# Patient Record
Sex: Female | Born: 1945 | Race: Black or African American | Hispanic: No | Smoking: Never smoker
Health system: Southern US, Community
[De-identification: ages and names within clinical notes are randomized; demographics above are authoritative.]

## PROBLEM LIST (undated history)

## (undated) DIAGNOSIS — E119 Type 2 diabetes mellitus without complications: Secondary | ICD-10-CM

## (undated) DIAGNOSIS — J45909 Unspecified asthma, uncomplicated: Secondary | ICD-10-CM

## (undated) DIAGNOSIS — R011 Cardiac murmur, unspecified: Secondary | ICD-10-CM

## (undated) DIAGNOSIS — K589 Irritable bowel syndrome without diarrhea: Secondary | ICD-10-CM

## (undated) DIAGNOSIS — T783XXA Angioneurotic edema, initial encounter: Secondary | ICD-10-CM

## (undated) DIAGNOSIS — F4389 Other reactions to severe stress: Secondary | ICD-10-CM

## (undated) DIAGNOSIS — I10 Essential (primary) hypertension: Secondary | ICD-10-CM

## (undated) DIAGNOSIS — L309 Dermatitis, unspecified: Secondary | ICD-10-CM

## (undated) DIAGNOSIS — M545 Low back pain, unspecified: Secondary | ICD-10-CM

## (undated) DIAGNOSIS — J309 Allergic rhinitis, unspecified: Secondary | ICD-10-CM

## (undated) DIAGNOSIS — E785 Hyperlipidemia, unspecified: Secondary | ICD-10-CM

## (undated) DIAGNOSIS — I499 Cardiac arrhythmia, unspecified: Secondary | ICD-10-CM

## (undated) DIAGNOSIS — F438 Other reactions to severe stress: Secondary | ICD-10-CM

## (undated) DIAGNOSIS — F0390 Unspecified dementia without behavioral disturbance: Secondary | ICD-10-CM

## (undated) DIAGNOSIS — K449 Diaphragmatic hernia without obstruction or gangrene: Secondary | ICD-10-CM

## (undated) HISTORY — DX: Angioneurotic edema, initial encounter: T78.3XXA

## (undated) HISTORY — DX: Irritable bowel syndrome, unspecified: K58.9

## (undated) HISTORY — DX: Hyperlipidemia, unspecified: E78.5

## (undated) HISTORY — DX: Unspecified asthma, uncomplicated: J45.909

## (undated) HISTORY — DX: Low back pain: M54.5

## (undated) HISTORY — DX: Low back pain, unspecified: M54.50

## (undated) HISTORY — DX: Dermatitis, unspecified: L30.9

## (undated) HISTORY — DX: Cardiac murmur, unspecified: R01.1

## (undated) HISTORY — DX: Diaphragmatic hernia without obstruction or gangrene: K44.9

## (undated) HISTORY — DX: Other reactions to severe stress: F43.8

## (undated) HISTORY — DX: Other reactions to severe stress: F43.89

## (undated) HISTORY — DX: Type 2 diabetes mellitus without complications: E11.9

## (undated) HISTORY — PX: TUBAL LIGATION: SHX77

## (undated) HISTORY — DX: Allergic rhinitis, unspecified: J30.9

## (undated) HISTORY — DX: Essential (primary) hypertension: I10

---

## 1985-10-20 HISTORY — PX: OOPHORECTOMY: SHX86

## 1985-10-20 HISTORY — PX: ABDOMINAL HYSTERECTOMY: SHX81

## 1999-01-08 ENCOUNTER — Emergency Department (HOSPITAL_COMMUNITY): Admission: EM | Admit: 1999-01-08 | Discharge: 1999-01-09 | Payer: Self-pay | Admitting: Emergency Medicine

## 1999-12-24 ENCOUNTER — Encounter: Payer: Self-pay | Admitting: Obstetrics and Gynecology

## 1999-12-24 ENCOUNTER — Encounter: Admission: RE | Admit: 1999-12-24 | Discharge: 1999-12-24 | Payer: Self-pay | Admitting: Obstetrics and Gynecology

## 2002-02-11 ENCOUNTER — Emergency Department (HOSPITAL_COMMUNITY): Admission: EM | Admit: 2002-02-11 | Discharge: 2002-02-11 | Payer: Self-pay | Admitting: Emergency Medicine

## 2002-04-14 ENCOUNTER — Ambulatory Visit (HOSPITAL_COMMUNITY): Admission: RE | Admit: 2002-04-14 | Discharge: 2002-04-14 | Payer: Self-pay | Admitting: Family Medicine

## 2002-06-06 ENCOUNTER — Emergency Department (HOSPITAL_COMMUNITY): Admission: EM | Admit: 2002-06-06 | Discharge: 2002-06-06 | Payer: Self-pay | Admitting: Emergency Medicine

## 2002-06-08 ENCOUNTER — Ambulatory Visit (HOSPITAL_COMMUNITY): Admission: RE | Admit: 2002-06-08 | Discharge: 2002-06-08 | Payer: Self-pay | Admitting: Emergency Medicine

## 2002-06-08 ENCOUNTER — Encounter: Payer: Self-pay | Admitting: Emergency Medicine

## 2002-08-30 ENCOUNTER — Ambulatory Visit (HOSPITAL_COMMUNITY): Admission: RE | Admit: 2002-08-30 | Discharge: 2002-08-30 | Payer: Self-pay | Admitting: Gastroenterology

## 2002-08-30 ENCOUNTER — Encounter: Payer: Self-pay | Admitting: Gastroenterology

## 2002-09-01 ENCOUNTER — Encounter (INDEPENDENT_AMBULATORY_CARE_PROVIDER_SITE_OTHER): Payer: Self-pay | Admitting: Gastroenterology

## 2003-03-20 ENCOUNTER — Encounter: Payer: Self-pay | Admitting: Emergency Medicine

## 2003-03-20 ENCOUNTER — Inpatient Hospital Stay (HOSPITAL_COMMUNITY): Admission: EM | Admit: 2003-03-20 | Discharge: 2003-03-21 | Payer: Self-pay | Admitting: Emergency Medicine

## 2004-01-02 ENCOUNTER — Inpatient Hospital Stay (HOSPITAL_COMMUNITY): Admission: EM | Admit: 2004-01-02 | Discharge: 2004-01-03 | Payer: Self-pay | Admitting: Emergency Medicine

## 2004-08-09 ENCOUNTER — Ambulatory Visit: Payer: Self-pay | Admitting: Internal Medicine

## 2004-09-19 ENCOUNTER — Ambulatory Visit: Payer: Self-pay | Admitting: Internal Medicine

## 2004-12-19 ENCOUNTER — Ambulatory Visit: Payer: Self-pay | Admitting: Internal Medicine

## 2005-03-28 ENCOUNTER — Ambulatory Visit: Payer: Self-pay | Admitting: Internal Medicine

## 2005-06-24 ENCOUNTER — Ambulatory Visit: Payer: Self-pay | Admitting: Internal Medicine

## 2005-06-27 ENCOUNTER — Ambulatory Visit: Payer: Self-pay | Admitting: Internal Medicine

## 2005-09-26 ENCOUNTER — Ambulatory Visit: Payer: Self-pay | Admitting: Internal Medicine

## 2005-10-20 HISTORY — PX: HEMORRHOID SURGERY: SHX153

## 2005-12-25 ENCOUNTER — Ambulatory Visit: Payer: Self-pay | Admitting: Internal Medicine

## 2006-04-07 ENCOUNTER — Ambulatory Visit: Payer: Self-pay | Admitting: Internal Medicine

## 2006-04-08 ENCOUNTER — Ambulatory Visit: Payer: Self-pay | Admitting: Internal Medicine

## 2006-05-14 ENCOUNTER — Ambulatory Visit: Payer: Self-pay | Admitting: Internal Medicine

## 2006-05-22 ENCOUNTER — Ambulatory Visit: Payer: Self-pay | Admitting: Internal Medicine

## 2006-06-09 ENCOUNTER — Other Ambulatory Visit: Admission: RE | Admit: 2006-06-09 | Discharge: 2006-06-09 | Payer: Self-pay | Admitting: Obstetrics and Gynecology

## 2006-07-13 ENCOUNTER — Ambulatory Visit: Payer: Self-pay | Admitting: Internal Medicine

## 2006-08-21 ENCOUNTER — Ambulatory Visit: Payer: Self-pay | Admitting: Internal Medicine

## 2006-08-25 ENCOUNTER — Ambulatory Visit: Payer: Self-pay | Admitting: Internal Medicine

## 2006-09-28 ENCOUNTER — Ambulatory Visit: Payer: Self-pay | Admitting: Internal Medicine

## 2006-12-29 ENCOUNTER — Ambulatory Visit: Payer: Self-pay | Admitting: Internal Medicine

## 2006-12-29 LAB — CONVERTED CEMR LAB
ALT: 22 units/L (ref 0–40)
AST: 21 units/L (ref 0–37)
Albumin: 3.7 g/dL (ref 3.5–5.2)
Alkaline Phosphatase: 74 units/L (ref 39–117)
Calcium: 9 mg/dL (ref 8.4–10.5)
Chloride: 102 meq/L (ref 96–112)
Cholesterol: 202 mg/dL (ref 0–200)
Creatinine, Ser: 0.9 mg/dL (ref 0.4–1.2)
GFR calc non Af Amer: 68 mL/min
Hgb A1c MFr Bld: 7.6 % — ABNORMAL HIGH (ref 4.6–6.0)
Sodium: 143 meq/L (ref 135–145)
Total Bilirubin: 0.8 mg/dL (ref 0.3–1.2)
VLDL: 15 mg/dL (ref 0–40)

## 2007-03-03 ENCOUNTER — Ambulatory Visit: Payer: Self-pay | Admitting: Internal Medicine

## 2007-04-27 ENCOUNTER — Ambulatory Visit: Payer: Self-pay | Admitting: Internal Medicine

## 2007-04-30 ENCOUNTER — Ambulatory Visit (HOSPITAL_COMMUNITY): Admission: RE | Admit: 2007-04-30 | Discharge: 2007-04-30 | Payer: Self-pay | Admitting: Obstetrics and Gynecology

## 2007-06-17 ENCOUNTER — Ambulatory Visit: Payer: Self-pay | Admitting: Internal Medicine

## 2007-07-02 ENCOUNTER — Encounter: Payer: Self-pay | Admitting: Internal Medicine

## 2007-07-02 ENCOUNTER — Ambulatory Visit (HOSPITAL_COMMUNITY): Admission: RE | Admit: 2007-07-02 | Discharge: 2007-07-02 | Payer: Self-pay | Admitting: Internal Medicine

## 2007-07-02 HISTORY — PX: COLONOSCOPY: SHX174

## 2007-07-02 LAB — HM COLONOSCOPY

## 2007-07-07 ENCOUNTER — Ambulatory Visit: Payer: Self-pay | Admitting: Internal Medicine

## 2007-07-22 ENCOUNTER — Encounter: Payer: Self-pay | Admitting: Internal Medicine

## 2007-07-22 DIAGNOSIS — E118 Type 2 diabetes mellitus with unspecified complications: Secondary | ICD-10-CM | POA: Insufficient documentation

## 2007-07-22 DIAGNOSIS — I10 Essential (primary) hypertension: Secondary | ICD-10-CM | POA: Insufficient documentation

## 2007-07-22 DIAGNOSIS — E1165 Type 2 diabetes mellitus with hyperglycemia: Secondary | ICD-10-CM

## 2007-07-22 DIAGNOSIS — E11319 Type 2 diabetes mellitus with unspecified diabetic retinopathy without macular edema: Secondary | ICD-10-CM

## 2007-08-13 ENCOUNTER — Encounter: Admission: RE | Admit: 2007-08-13 | Discharge: 2007-08-13 | Payer: Self-pay | Admitting: Surgery

## 2007-08-16 ENCOUNTER — Ambulatory Visit (HOSPITAL_BASED_OUTPATIENT_CLINIC_OR_DEPARTMENT_OTHER): Admission: RE | Admit: 2007-08-16 | Discharge: 2007-08-16 | Payer: Self-pay | Admitting: Surgery

## 2007-08-16 ENCOUNTER — Encounter (INDEPENDENT_AMBULATORY_CARE_PROVIDER_SITE_OTHER): Payer: Self-pay | Admitting: Surgery

## 2007-11-04 ENCOUNTER — Ambulatory Visit: Payer: Self-pay | Admitting: Internal Medicine

## 2007-11-04 DIAGNOSIS — L272 Dermatitis due to ingested food: Secondary | ICD-10-CM

## 2007-11-04 DIAGNOSIS — L259 Unspecified contact dermatitis, unspecified cause: Secondary | ICD-10-CM

## 2007-11-06 DIAGNOSIS — E785 Hyperlipidemia, unspecified: Secondary | ICD-10-CM | POA: Insufficient documentation

## 2007-11-06 DIAGNOSIS — E1169 Type 2 diabetes mellitus with other specified complication: Secondary | ICD-10-CM | POA: Insufficient documentation

## 2008-01-17 ENCOUNTER — Telehealth: Payer: Self-pay | Admitting: Internal Medicine

## 2008-01-17 DIAGNOSIS — Z872 Personal history of diseases of the skin and subcutaneous tissue: Secondary | ICD-10-CM | POA: Insufficient documentation

## 2008-01-18 ENCOUNTER — Encounter: Payer: Self-pay | Admitting: Internal Medicine

## 2008-01-18 ENCOUNTER — Ambulatory Visit: Payer: Self-pay | Admitting: Internal Medicine

## 2008-01-18 DIAGNOSIS — J4531 Mild persistent asthma with (acute) exacerbation: Secondary | ICD-10-CM

## 2008-01-18 DIAGNOSIS — J209 Acute bronchitis, unspecified: Secondary | ICD-10-CM | POA: Insufficient documentation

## 2008-01-18 DIAGNOSIS — J4530 Mild persistent asthma, uncomplicated: Secondary | ICD-10-CM | POA: Insufficient documentation

## 2008-01-25 ENCOUNTER — Telehealth: Payer: Self-pay | Admitting: Internal Medicine

## 2008-01-25 ENCOUNTER — Encounter: Payer: Self-pay | Admitting: Internal Medicine

## 2008-01-27 ENCOUNTER — Encounter (INDEPENDENT_AMBULATORY_CARE_PROVIDER_SITE_OTHER): Payer: Self-pay | Admitting: *Deleted

## 2008-02-02 ENCOUNTER — Ambulatory Visit: Payer: Self-pay | Admitting: Internal Medicine

## 2008-02-02 DIAGNOSIS — J309 Allergic rhinitis, unspecified: Secondary | ICD-10-CM

## 2008-02-16 ENCOUNTER — Ambulatory Visit: Payer: Self-pay | Admitting: Pulmonary Disease

## 2008-03-09 ENCOUNTER — Telehealth: Payer: Self-pay | Admitting: Internal Medicine

## 2008-03-14 ENCOUNTER — Ambulatory Visit: Payer: Self-pay | Admitting: Internal Medicine

## 2008-03-22 ENCOUNTER — Ambulatory Visit: Payer: Self-pay | Admitting: Internal Medicine

## 2008-03-22 LAB — CONVERTED CEMR LAB
BUN: 5 mg/dL — ABNORMAL LOW (ref 6–23)
CO2: 32 meq/L (ref 19–32)
Chloride: 109 meq/L (ref 96–112)
Glucose, Bld: 150 mg/dL — ABNORMAL HIGH (ref 70–99)
Potassium: 3.6 meq/L (ref 3.5–5.1)
Sodium: 135 meq/L (ref 135–145)

## 2008-04-10 ENCOUNTER — Ambulatory Visit: Payer: Self-pay | Admitting: Internal Medicine

## 2008-06-01 ENCOUNTER — Ambulatory Visit: Payer: Self-pay | Admitting: Internal Medicine

## 2008-06-02 ENCOUNTER — Ambulatory Visit (HOSPITAL_COMMUNITY): Admission: RE | Admit: 2008-06-02 | Discharge: 2008-06-02 | Payer: Self-pay | Admitting: Obstetrics and Gynecology

## 2008-07-17 ENCOUNTER — Telehealth (INDEPENDENT_AMBULATORY_CARE_PROVIDER_SITE_OTHER): Payer: Self-pay | Admitting: *Deleted

## 2008-07-17 ENCOUNTER — Telehealth: Payer: Self-pay | Admitting: Internal Medicine

## 2008-08-03 ENCOUNTER — Ambulatory Visit: Payer: Self-pay | Admitting: Internal Medicine

## 2008-08-10 ENCOUNTER — Encounter: Payer: Self-pay | Admitting: Internal Medicine

## 2008-08-10 ENCOUNTER — Ambulatory Visit: Payer: Self-pay

## 2008-08-17 ENCOUNTER — Ambulatory Visit: Payer: Self-pay | Admitting: Internal Medicine

## 2008-08-17 DIAGNOSIS — F438 Other reactions to severe stress: Secondary | ICD-10-CM

## 2008-08-17 DIAGNOSIS — F4389 Other reactions to severe stress: Secondary | ICD-10-CM | POA: Insufficient documentation

## 2008-09-03 ENCOUNTER — Emergency Department (HOSPITAL_COMMUNITY): Admission: EM | Admit: 2008-09-03 | Discharge: 2008-09-03 | Payer: Self-pay | Admitting: Emergency Medicine

## 2008-09-04 ENCOUNTER — Telehealth: Payer: Self-pay | Admitting: Internal Medicine

## 2008-09-04 ENCOUNTER — Ambulatory Visit: Payer: Self-pay | Admitting: Internal Medicine

## 2008-09-04 DIAGNOSIS — R55 Syncope and collapse: Secondary | ICD-10-CM | POA: Insufficient documentation

## 2008-09-27 ENCOUNTER — Ambulatory Visit: Payer: Self-pay | Admitting: Internal Medicine

## 2009-01-09 ENCOUNTER — Ambulatory Visit: Payer: Self-pay | Admitting: Internal Medicine

## 2009-01-09 LAB — CONVERTED CEMR LAB
AST: 28 units/L (ref 0–37)
Alkaline Phosphatase: 95 units/L (ref 39–117)
BUN: 11 mg/dL (ref 6–23)
CO2: 32 meq/L (ref 19–32)
Chloride: 102 meq/L (ref 96–112)
Cholesterol: 151 mg/dL (ref 0–200)
GFR calc non Af Amer: 72.22 mL/min (ref >60)
Glucose, Bld: 109 mg/dL — ABNORMAL HIGH (ref 70–99)
LDL Cholesterol: 89 mg/dL (ref 0–99)
Potassium: 3.5 meq/L (ref 3.5–5.1)
Sodium: 144 meq/L (ref 135–145)
Total Protein: 7.2 g/dL (ref 6.0–8.3)
Triglycerides: 59 mg/dL (ref 0.0–149.0)

## 2009-01-12 ENCOUNTER — Ambulatory Visit: Payer: Self-pay | Admitting: Internal Medicine

## 2009-02-13 ENCOUNTER — Encounter: Payer: Self-pay | Admitting: Internal Medicine

## 2009-02-14 ENCOUNTER — Ambulatory Visit: Payer: Self-pay | Admitting: Internal Medicine

## 2009-02-14 ENCOUNTER — Encounter: Payer: Self-pay | Admitting: Internal Medicine

## 2009-03-06 ENCOUNTER — Ambulatory Visit: Payer: Self-pay | Admitting: Internal Medicine

## 2009-06-04 ENCOUNTER — Ambulatory Visit (HOSPITAL_COMMUNITY): Admission: RE | Admit: 2009-06-04 | Discharge: 2009-06-04 | Payer: Self-pay | Admitting: Obstetrics and Gynecology

## 2009-06-06 ENCOUNTER — Encounter: Admission: RE | Admit: 2009-06-06 | Discharge: 2009-06-06 | Payer: Self-pay | Admitting: Obstetrics and Gynecology

## 2009-07-03 ENCOUNTER — Ambulatory Visit: Payer: Self-pay | Admitting: Internal Medicine

## 2009-07-09 LAB — CONVERTED CEMR LAB
AST: 30 units/L (ref 0–37)
Albumin: 4.3 g/dL (ref 3.5–5.2)
Alkaline Phosphatase: 89 units/L (ref 39–117)
BUN: 8 mg/dL (ref 6–23)
Basophils Relative: 0.3 % (ref 0.0–3.0)
Chloride: 105 meq/L (ref 96–112)
GFR calc non Af Amer: 72.1 mL/min (ref >60)
Glucose, Bld: 107 mg/dL — ABNORMAL HIGH (ref 70–99)
HCT: 40.1 % (ref 36.0–46.0)
Hemoglobin: 13 g/dL (ref 12.0–15.0)
Hgb A1c MFr Bld: 6.6 % — ABNORMAL HIGH (ref 4.6–6.5)
Ketones, ur: NEGATIVE mg/dL
Lymphocytes Relative: 30.6 % (ref 12.0–46.0)
Lymphs Abs: 2.6 10*3/uL (ref 0.7–4.0)
MCHC: 32.5 g/dL (ref 30.0–36.0)
Monocytes Relative: 4.4 % (ref 3.0–12.0)
Neutro Abs: 4.9 10*3/uL (ref 1.4–7.7)
Potassium: 3.3 meq/L — ABNORMAL LOW (ref 3.5–5.1)
RBC: 4.56 M/uL (ref 3.87–5.11)
Sodium: 148 meq/L — ABNORMAL HIGH (ref 135–145)
TSH: 1.31 microintl units/mL (ref 0.35–5.50)
Total Protein: 7.7 g/dL (ref 6.0–8.3)
Urine Glucose: NEGATIVE mg/dL
Urobilinogen, UA: 0.2 (ref 0.0–1.0)

## 2009-10-16 ENCOUNTER — Telehealth: Payer: Self-pay | Admitting: Internal Medicine

## 2009-10-31 ENCOUNTER — Ambulatory Visit: Payer: Self-pay | Admitting: Internal Medicine

## 2009-10-31 LAB — CONVERTED CEMR LAB
CO2: 30 meq/L (ref 19–32)
Chloride: 106 meq/L (ref 96–112)
GFR calc non Af Amer: 72.03 mL/min (ref >60)
Glucose, Bld: 95 mg/dL (ref 70–99)
Potassium: 4 meq/L (ref 3.5–5.1)
Sodium: 143 meq/L (ref 135–145)

## 2009-11-12 ENCOUNTER — Ambulatory Visit: Payer: Self-pay | Admitting: Internal Medicine

## 2009-11-12 DIAGNOSIS — J069 Acute upper respiratory infection, unspecified: Secondary | ICD-10-CM | POA: Insufficient documentation

## 2009-12-04 ENCOUNTER — Encounter: Admission: RE | Admit: 2009-12-04 | Discharge: 2009-12-04 | Payer: Self-pay | Admitting: Obstetrics and Gynecology

## 2010-01-21 ENCOUNTER — Encounter (INDEPENDENT_AMBULATORY_CARE_PROVIDER_SITE_OTHER): Payer: Self-pay | Admitting: *Deleted

## 2010-01-21 ENCOUNTER — Ambulatory Visit: Payer: Self-pay | Admitting: Internal Medicine

## 2010-01-21 DIAGNOSIS — T783XXA Angioneurotic edema, initial encounter: Secondary | ICD-10-CM

## 2010-01-21 HISTORY — DX: Angioneurotic edema, initial encounter: T78.3XXA

## 2010-03-12 ENCOUNTER — Ambulatory Visit: Payer: Self-pay | Admitting: Internal Medicine

## 2010-03-12 DIAGNOSIS — M545 Low back pain: Secondary | ICD-10-CM

## 2010-03-13 ENCOUNTER — Telehealth: Payer: Self-pay | Admitting: Internal Medicine

## 2010-03-14 LAB — CONVERTED CEMR LAB
Alkaline Phosphatase: 87 units/L (ref 39–117)
Basophils Absolute: 0 10*3/uL (ref 0.0–0.1)
Bilirubin, Direct: 0.1 mg/dL (ref 0.0–0.3)
CO2: 30 meq/L (ref 19–32)
Calcium: 9.3 mg/dL (ref 8.4–10.5)
Eosinophils Absolute: 0.5 10*3/uL (ref 0.0–0.7)
HCT: 37.8 % (ref 36.0–46.0)
Hemoglobin: 12.5 g/dL (ref 12.0–15.0)
Ketones, ur: NEGATIVE mg/dL
Lymphocytes Relative: 23.5 % (ref 12.0–46.0)
Lymphs Abs: 2.1 10*3/uL (ref 0.7–4.0)
MCHC: 33.1 g/dL (ref 30.0–36.0)
Neutro Abs: 5.9 10*3/uL (ref 1.4–7.7)
Potassium: 3.6 meq/L (ref 3.5–5.1)
RDW: 14.3 % (ref 11.5–14.6)
Sodium: 144 meq/L (ref 135–145)
Total Protein: 6.9 g/dL (ref 6.0–8.3)
Urine Glucose: NEGATIVE mg/dL
Urobilinogen, UA: 0.2 (ref 0.0–1.0)
Vitamin B-12: 485 pg/mL (ref 211–911)

## 2010-04-16 ENCOUNTER — Ambulatory Visit: Payer: Self-pay | Admitting: Internal Medicine

## 2010-04-16 DIAGNOSIS — R05 Cough: Secondary | ICD-10-CM | POA: Insufficient documentation

## 2010-05-08 ENCOUNTER — Ambulatory Visit: Payer: Self-pay | Admitting: Internal Medicine

## 2010-05-08 DIAGNOSIS — R21 Rash and other nonspecific skin eruption: Secondary | ICD-10-CM | POA: Insufficient documentation

## 2010-05-20 ENCOUNTER — Ambulatory Visit: Payer: Self-pay | Admitting: Obstetrics and Gynecology

## 2010-05-20 ENCOUNTER — Other Ambulatory Visit: Admission: RE | Admit: 2010-05-20 | Discharge: 2010-05-20 | Payer: Self-pay | Admitting: Obstetrics and Gynecology

## 2010-07-09 ENCOUNTER — Ambulatory Visit: Payer: Self-pay | Admitting: Internal Medicine

## 2010-07-11 ENCOUNTER — Encounter: Payer: Self-pay | Admitting: Internal Medicine

## 2010-08-02 ENCOUNTER — Ambulatory Visit: Payer: Self-pay | Admitting: Internal Medicine

## 2010-08-12 ENCOUNTER — Ambulatory Visit: Payer: Self-pay | Admitting: Internal Medicine

## 2010-08-14 LAB — CONVERTED CEMR LAB
ALT: 22 units/L (ref 0–35)
BUN: 6 mg/dL (ref 6–23)
Bilirubin Urine: NEGATIVE
Bilirubin, Direct: 0.1 mg/dL (ref 0.0–0.3)
Calcium: 9.1 mg/dL (ref 8.4–10.5)
Creatinine, Ser: 0.8 mg/dL (ref 0.4–1.2)
GFR calc non Af Amer: 91.62 mL/min (ref >60)
Hgb A1c MFr Bld: 7.3 % — ABNORMAL HIGH (ref 4.6–6.5)
Nitrite: NEGATIVE
Specific Gravity, Urine: 1.01 (ref 1.000–1.030)
Total Bilirubin: 0.4 mg/dL (ref 0.3–1.2)
Total Protein, Urine: NEGATIVE mg/dL
pH: 6 (ref 5.0–8.0)

## 2010-08-20 ENCOUNTER — Ambulatory Visit: Payer: Self-pay | Admitting: Obstetrics and Gynecology

## 2010-11-06 ENCOUNTER — Encounter: Payer: Self-pay | Admitting: Internal Medicine

## 2010-11-06 ENCOUNTER — Encounter
Admission: RE | Admit: 2010-11-06 | Discharge: 2010-11-06 | Payer: Self-pay | Source: Home / Self Care | Attending: Emergency Medicine | Admitting: Emergency Medicine

## 2010-11-10 ENCOUNTER — Encounter: Payer: Self-pay | Admitting: Obstetrics and Gynecology

## 2010-11-12 ENCOUNTER — Ambulatory Visit
Admission: RE | Admit: 2010-11-12 | Discharge: 2010-11-12 | Payer: Self-pay | Source: Home / Self Care | Attending: Internal Medicine | Admitting: Internal Medicine

## 2010-11-12 ENCOUNTER — Other Ambulatory Visit: Payer: Self-pay | Admitting: Internal Medicine

## 2010-11-12 LAB — BASIC METABOLIC PANEL
BUN: 8 mg/dL (ref 6–23)
Calcium: 8.9 mg/dL (ref 8.4–10.5)
GFR: 95.63 mL/min (ref >60.00)
Glucose, Bld: 129 mg/dL — ABNORMAL HIGH (ref 70–99)
Potassium: 3.5 mEq/L (ref 3.5–5.1)
Sodium: 143 mEq/L (ref 135–145)

## 2010-11-12 LAB — URINALYSIS, ROUTINE W REFLEX MICROSCOPIC
Bilirubin Urine: NEGATIVE
Ketones, ur: NEGATIVE
Nitrite: NEGATIVE
Specific Gravity, Urine: >=1.03 (ref 1.000–1.030)
Total Protein, Urine: NEGATIVE
pH: 5.5 (ref 5.0–8.0)

## 2010-11-12 LAB — HEPATIC FUNCTION PANEL
ALT: 20 U/L (ref 0–35)
AST: 19 U/L (ref 0–37)
Alkaline Phosphatase: 77 U/L (ref 39–117)
Bilirubin, Direct: 0.1 mg/dL (ref 0.0–0.3)
Total Bilirubin: 0.5 mg/dL (ref 0.3–1.2)

## 2010-11-12 LAB — CBC WITH DIFFERENTIAL/PLATELET
Basophils Absolute: 0 10*3/uL (ref 0.0–0.1)
Eosinophils Relative: 7.6 % — ABNORMAL HIGH (ref 0.0–5.0)
HCT: 37.3 % (ref 36.0–46.0)
Lymphocytes Relative: 21.4 % (ref 12.0–46.0)
Lymphs Abs: 1.7 10*3/uL (ref 0.7–4.0)
Monocytes Relative: 4.3 % (ref 3.0–12.0)
Platelets: 235 10*3/uL (ref 150.0–400.0)
RDW: 14.6 % (ref 11.5–14.6)
WBC: 7.9 10*3/uL (ref 4.5–10.5)

## 2010-11-12 LAB — LIPID PANEL
HDL: 45.8 mg/dL (ref >39.00)
Triglycerides: 102 mg/dL (ref 0.0–149.0)

## 2010-11-12 LAB — LDL CHOLESTEROL, DIRECT: Direct LDL: 140.2 mg/dL

## 2010-11-15 ENCOUNTER — Ambulatory Visit
Admission: RE | Admit: 2010-11-15 | Discharge: 2010-11-15 | Payer: Self-pay | Source: Home / Self Care | Attending: Internal Medicine | Admitting: Internal Medicine

## 2010-11-15 DIAGNOSIS — S060X9A Concussion with loss of consciousness of unspecified duration, initial encounter: Secondary | ICD-10-CM | POA: Insufficient documentation

## 2010-11-15 DIAGNOSIS — S060XAA Concussion with loss of consciousness status unknown, initial encounter: Secondary | ICD-10-CM | POA: Insufficient documentation

## 2010-11-15 DIAGNOSIS — M542 Cervicalgia: Secondary | ICD-10-CM | POA: Insufficient documentation

## 2010-11-19 NOTE — Assessment & Plan Note (Signed)
Summary: 4 MO ROV /NWS  #   Vital Signs:  Patient profile:   65 year old female Weight:      192 pounds Temp:     98.3 degrees F oral Pulse rate:   54 / minute BP sitting:   138 / 76  (left arm)  Vitals Entered By: Tora Perches (November 12, 2009 11:21 AM) CC: f/u Is Patient Diabetic? Yes   Primary Care Provider:  Dr. Posey Rea  CC:  f/u.  History of Present Illness: The patient presents for a follow up of hypertension, diabetes, hyperlipidemia. Ran out of Norvasc... The patient presents with complaints of sore throat, fever, cough, sinus congestion and drainge of 10-14 days duration. Not better with OTC meds. Chest hurts with coughing. Can't sleep due to cough.   The mucus is colored.    Preventive Screening-Counseling & Management  Alcohol-Tobacco     Smoking Status: never  Current Medications (verified): 1)  Klor-Con M20 20 Meq  Tbcr (Potassium Chloride Crys Cr) .... Two Times A Day 2)  Fexofenadine Hcl 180 Mg  Tabs (Fexofenadine Hcl) .... Once Daily 3)  Nasonex 50 Mcg/act  Susp (Mometasone Furoate) .Marland Kitchen.. 1 Spray Each Nostril Once Daily 4)  Advair Diskus 100-50 Mcg/dose  Misc (Fluticasone-Salmeterol) .... One Puff Two Times A Day For Asthma 5)  Epipen 0.3 Mg/0.54ml (1:1000)  Devi (Epinephrine Hcl (Anaphylaxis)) .... As Needed 6)  Anusol-Hc 25 Mg Supp (Hydrocortisone Acetate) .... Insert 1 Suppository Into Rectum Once A Day 7)  Glipizide-Metformin Hcl 2.5-500 Mg  Tabs (Glipizide-Metformin Hcl) .Marland Kitchen.. 1 By Mouth Two Times A Day 8)  Prevacid 30 Mg Cpdr (Lansoprazole) .... One By Mouth Daily 9)  Hyzaar 100-12.5 Mg  Tabs (Losartan Potassium-Hctz) .Marland Kitchen.. 1 By Mouth Daily 10)  Aspirin 81 Mg  Tbec (Aspirin) .... One By Mouth Every Day 11)  Fluoxetine Hcl 10 Mg Tabs (Fluoxetine Hcl) .Marland Kitchen.. 1 By Mouth Qd 12)  Amlodipine Besylate 5 Mg  Tabs (Amlodipine Besylate) .Marland Kitchen.. 1 By Mouth Every Day 13)  Vitamin D3 1000 Unit  Tabs (Cholecalciferol) .Marland Kitchen.. 1 By Mouth Daily  Allergies: 1)  ! Dynacirc  Cr (Isradipine) 2)  ! Maxzide 3)  ! Lovastatin (Lovastatin) 4)  ! Vytorin (Ezetimibe-Simvastatin) 5)  ! * Fish Oil 6)  ! Sulfadiazine (Sulfadiazine) 7)  Clonidine Hcl (Clonidine Hcl) 8)  * Dynacire 9)  * Maxide 10)  Sulfamethoxazole (Sulfamethoxazole) 11)  Metformin Hcl (Metformin Hcl)  Past History:  Past Medical History: Last updated: 02/02/2008 Diabetes mellitus, type II Hypertension IBS  Dr Juanda Chance Eczema Food allergies Hemorhoids Hyperlipidemia Asthma Allergic rhinitis  Social History: Last updated: 07/03/2009 Retired, workig part time [employer] Divorced Never Smoked  Review of Systems       The patient complains of prolonged cough.  The patient denies fever, syncope, and dyspnea on exertion.    Physical Exam  General:  obese.   Mouth:  Erythematous throat mucosa and intranasal erythema.  Lungs:  Normal respiratory effort, chest expands symmetrically. Lungs are clear to auscultation, no crackles or wheezes. Heart:  Normal rate and regular rhythm. S1 and S2 normal without gallop, murmur, click, rub or other extra sounds. Abdomen:  soft, non-tender, normal bowel sounds, no distention, no masses, no guarding, no rigidity, no rebound tenderness, no abdominal hernia, no inguinal hernia, no hepatomegaly, and no splenomegaly.   Msk:  No deformity or scoliosis noted of thoracic or lumbar spine.  R wrist is tender to palp and w/ROM Neurologic:  No cranial nerve deficits noted. Station  and gait are normal. Plantar reflexes are down-going bilaterally. DTRs are symmetrical throughout. Sensory, motor and coordinative functions appear intact. Skin:  Intact without suspicious lesions or rashes Psych:  Cognition and judgment appear intact. Alert and cooperative with normal attention span and concentration. No apparent delusions, illusions, hallucinations   Impression & Recommendations:  Problem # 1:  DIABETES MELLITUS, TYPE II (ICD-250.00) Assessment Unchanged  Her updated  medication list for this problem includes:    Glipizide-metformin Hcl 2.5-500 Mg Tabs (Glipizide-metformin hcl) .Marland Kitchen... 1 by mouth two times a day    Hyzaar 100-12.5 Mg Tabs (Losartan potassium-hctz) .Marland Kitchen... 1 by mouth daily    Aspirin 81 Mg Tbec (Aspirin) ..... One by mouth every day The labs were reviewed with the patient.   Problem # 2:  HYPERTENSION (ICD-401.9) Assessment: Unchanged  Her updated medication list for this problem includes:    Hyzaar 100-12.5 Mg Tabs (Losartan potassium-hctz) .Marland Kitchen... 1 by mouth daily    Amlodipine Besylate 5 Mg Tabs (Amlodipine besylate) .Marland Kitchen... 1 by mouth every day  BP today: 138/76 Prior BP: 142/72 (07/03/2009)  Labs Reviewed: K+: 4.0 (10/31/2009) Creat: : 1.0 (10/31/2009)   Chol: 151 (01/09/2009)   HDL: 49.90 (01/09/2009)   LDL: 89 (01/09/2009)   TG: 59.0 (01/09/2009)  Problem # 3:  ASTHMA (ICD-493.90) Assessment: Deteriorated  Her updated medication list for this problem includes:    Advair Diskus 100-50 Mcg/dose Misc (Fluticasone-salmeterol) ..... One puff two times a day for asthma  Problem # 4:  UPPER RESPIRATORY INFECTION, ACUTE (ICD-465.9) Assessment: New  The following medications were removed from the medication list:    Celebrex 200 Mg Caps (Celecoxib) ..... One by mouth once daily for pain in hip Her updated medication list for this problem includes:    Fexofenadine Hcl 180 Mg Tabs (Fexofenadine hcl) ..... Once daily    Aspirin 81 Mg Tbec (Aspirin) ..... One by mouth every day    Promethazine-codeine 6.25-10 Mg/67ml Syrp (Promethazine-codeine) .Marland Kitchen... 5-10 ml by mouth q id as needed cough  Problem # 5:  ANXIETY, SITUATIONAL (ICD-308.3)  Problem # 6:  HYPERLIPIDEMIA (ICD-272.4) Assessment: Comment Only  Complete Medication List: 1)  Klor-con M20 20 Meq Tbcr (Potassium chloride crys cr) .... Two times a day 2)  Fexofenadine Hcl 180 Mg Tabs (Fexofenadine hcl) .... Once daily 3)  Nasonex 50 Mcg/act Susp (Mometasone furoate) .Marland Kitchen.. 1 spray  each nostril once daily 4)  Advair Diskus 100-50 Mcg/dose Misc (Fluticasone-salmeterol) .... One puff two times a day for asthma 5)  Epipen 0.3 Mg/0.85ml (1:1000) Devi (Epinephrine hcl (anaphylaxis)) .... As needed 6)  Anusol-hc 25 Mg Supp (Hydrocortisone acetate) .... Insert 1 suppository into rectum once a day 7)  Glipizide-metformin Hcl 2.5-500 Mg Tabs (Glipizide-metformin hcl) .Marland Kitchen.. 1 by mouth two times a day 8)  Prevacid 30 Mg Cpdr (Lansoprazole) .... One by mouth daily 9)  Hyzaar 100-12.5 Mg Tabs (Losartan potassium-hctz) .Marland Kitchen.. 1 by mouth daily 10)  Aspirin 81 Mg Tbec (Aspirin) .... One by mouth every day 11)  Fluoxetine Hcl 10 Mg Tabs (Fluoxetine hcl) .Marland Kitchen.. 1 by mouth qd 12)  Amlodipine Besylate 5 Mg Tabs (Amlodipine besylate) .Marland Kitchen.. 1 by mouth every day 13)  Vitamin D3 1000 Unit Tabs (Cholecalciferol) .Marland Kitchen.. 1 by mouth daily 14)  Zithromax Z-pak 250 Mg Tabs (Azithromycin) .... As dirrected 15)  Promethazine-codeine 6.25-10 Mg/87ml Syrp (Promethazine-codeine) .... 5-10 ml by mouth q id as needed cough  Patient Instructions: 1)  Please schedule a follow-up appointment in 4 months. 2)  BMP prior  to visit, ICD-9: 3)  Lipid Panel prior to visit, ICD-9: 4)  HbgA1C prior to visit, ICD-9:250.00 Prescriptions: PROMETHAZINE-CODEINE 6.25-10 MG/5ML SYRP (PROMETHAZINE-CODEINE) 5-10 ml by mouth q id as needed cough  #300 ml x 0   Entered and Authorized by:   Tresa Garter MD   Signed by:   Tresa Garter MD on 11/12/2009   Method used:   Print then Give to Patient   RxID:   [id number] [id number] Z-PAK 250 MG TABS (AZITHROMYCIN) as dirrected  #1 x 0   Entered and Authorized by:   Tresa Garter MD   Signed by:   Tresa Garter MD on 11/12/2009   Method used:   Print then Give to Patient   RxID:   [id number] AMLODIPINE BESYLATE 5 MG  TABS (AMLODIPINE BESYLATE) 1 by mouth every day  #30 x 12   Entered and Authorized by:   Tresa Garter MD   Signed by:   Tresa Garter MD on 11/12/2009   Method used:   Print then Give to Patient   RxID:   [id number] AMLODIPINE BESYLATE 5 MG  TABS (AMLODIPINE BESYLATE) 1 by mouth every day  #90 x 3   Entered and Authorized by:   Tresa Garter MD   Signed by:   Tresa Garter MD on 11/12/2009   Method used:   Print then Give to Patient   RxID:   [id number]

## 2010-11-19 NOTE — Letter (Signed)
Summary: Out of Work  [facility]  [address] [place], [place] [address]   Phone: [phone]  Fax: [phone]    January 21, 2010   Employee:  Pamela Morgan    To Whom It May Concern:   For Medical reasons, please excuse the above named employee from work for the following dates:  Start: 01/21/10    End: 01/22/10, May return to work tomorrow     If you need additional information, please feel free to contact our office.         Sincerely,    Dr. Rene Paci

## 2010-11-19 NOTE — Assessment & Plan Note (Signed)
Summary: right knee pain,swollen/plot/cd   Vital Signs:  Patient profile:   65 year old female Height:      64 inches (162.56 cm) Weight:      190 pounds (86.36 kg) O2 Sat:      96 % on Room air Temp:     98.3 degrees F (36.83 degrees C) oral Pulse rate:   59 / minute BP sitting:   140 / 82  (left arm) Cuff size:   regular  Vitals Entered By: Orlan Leavens RMA (July 09, 2010 10:03 AM)  O2 Flow:  Room air CC: (R) knee pain & swollen Is Patient Diabetic? Yes Did you bring your meter with you today? No Pain Assessment Patient in pain? yes     Location: (R) knee Type: aching   Primary Care Provider:  Dr. Posey Rea  CC:  (R) knee pain & swollen.  History of Present Illness: c/o right knee pain onset 3 days ago precipitated by climbing stairs assoc with swelling behind the knee reports assoc difficulty walking due to pain, but min pain while sitting/at rest denies injury or twisting no hx same - no known OA or prior injury no other joints inflammed or affected in UE/hands, feet or left leg  Clinical Review Panels:  Lipid Management   Cholesterol:  151 (01/09/2009)   LDL (bad choesterol):  89 (01/09/2009)   HDL (good cholesterol):  49.90 (01/09/2009)  CBC   WBC:  9.0 (03/12/2010)   RBC:  4.35 (03/12/2010)   Hgb:  12.5 (03/12/2010)   Hct:  37.8 (03/12/2010)   Platelets:  265.0 (03/12/2010)   MCV  86.8 (03/12/2010)   MCHC  33.1 (03/12/2010)   RDW  14.3 (03/12/2010)   PMN:  65.3 (03/12/2010)   Lymphs:  23.5 (03/12/2010)   Monos:  5.5 (03/12/2010)   Eosinophils:  5.3 (03/12/2010)   Basophil:  0.4 (03/12/2010)  Complete Metabolic Panel   Glucose:  67 (03/12/2010)   Sodium:  144 (03/12/2010)   Potassium:  3.6 (03/12/2010)   Chloride:  106 (03/12/2010)   CO2:  30 (03/12/2010)   BUN:  8 (03/12/2010)   Creatinine:  0.8 (03/12/2010)   Albumin:  4.2 (03/12/2010)   Total Protein:  6.9 (03/12/2010)   Calcium:  9.3 (03/12/2010)   Total Bili:  0.7 (03/12/2010)   Alk Phos:  87 (03/12/2010)   SGPT (ALT):  23 (03/12/2010)   SGOT (AST):  25 (03/12/2010)   Current Medications (verified): 1)  Klor-Con M20 20 Meq  Tbcr (Potassium Chloride Crys Cr) .... Two Times A Day 2)  Fexofenadine Hcl 180 Mg  Tabs (Fexofenadine Hcl) .... Once Daily 3)  Nasonex 50 Mcg/act  Susp (Mometasone Furoate) .Marland Kitchen.. 1 Spray Each Nostril Once Daily 4)  Advair Diskus 100-50 Mcg/dose  Misc (Fluticasone-Salmeterol) .... One Puff Two Times A Day For Asthma 5)  Epipen 0.3 Mg/0.21ml (1:1000)  Devi (Epinephrine Hcl (Anaphylaxis)) .... As Needed 6)  Anusol-Hc 25 Mg Supp (Hydrocortisone Acetate) .... Insert 1 Suppository Into Rectum Once A Day 7)  Glipizide-Metformin Hcl 2.5-500 Mg  Tabs (Glipizide-Metformin Hcl) .Marland Kitchen.. 1 By Mouth Two Times A Day 8)  Prevacid 30 Mg Cpdr (Lansoprazole) .... One By Mouth Daily 9)  Hyzaar 100-12.5 Mg  Tabs (Losartan Potassium-Hctz) .Marland Kitchen.. 1 By Mouth Daily 10)  Aspirin 81 Mg  Tbec (Aspirin) .... One By Mouth Every Day 11)  Fluoxetine Hcl 10 Mg Tabs (Fluoxetine Hcl) .Marland Kitchen.. 1 By Mouth Qd 12)  Amlodipine Besylate 5 Mg  Tabs (Amlodipine Besylate) .Marland Kitchen.. 1 By  Mouth Every Day 13)  Vitamin D3 1000 Unit  Tabs (Cholecalciferol) .Marland Kitchen.. 1 By Mouth Daily 14)  Promethazine-Dm 6.25-15 Mg/67ml Syrp (Promethazine-Dm) .... 5-10 Ml By Mouth Qid As Needed For Cough 15)  Proair Hfa 108 (90 Base) Mcg/act Aers (Albuterol Sulfate) .Marland Kitchen.. 1-2 Puffs Qid As Needed For Wheezing  Allergies (verified): 1)  ! Dynacirc Cr (Isradipine) 2)  ! Maxzide 3)  ! Lovastatin (Lovastatin) 4)  ! Vytorin (Ezetimibe-Simvastatin) 5)  ! * Fish Oil 6)  ! Sulfadiazine (Sulfadiazine) 7)  ! Codeine 8)  ! Codeine 9)  Clonidine Hcl (Clonidine Hcl) 10)  * Dynacire 11)  Metformin Hcl (Metformin Hcl) 12)  * Maxide 13)  Sulfamethoxazole (Sulfamethoxazole)  Past History:  Past medical, surgical, family and social histories (including risk factors) reviewed, and no changes noted (except as noted below).  Past Medical  History: Diabetes mellitus, type II Hypertension IBS  Dr Juanda Chance Eczema Food allergies Hemorhoids Hyperlipidemia Asthma Allergic rhinitis Statin intolerant  MD roster - allg - [facility]  Past Surgical History: Reviewed history from 11/04/2007 and no changes required. Hemorrhoidectomy 2007  Family History: Reviewed history from 02/16/2008 and no changes required. Mother - allergies  Social History: Reviewed history from 07/03/2009 and no changes required. Retired, working part time school bus [employer] Divorced Never Smoked  Review of Systems  The patient denies fever, chest pain, syncope, and headaches.    Physical Exam  General:  alert, well-developed, well-nourished, well-hydrated, appropriate dress, normal appearance, healthy-appearing, cooperative to examination, and good hygiene.   Lungs:  normal respiratory effort, no intercostal retractions or use of accessory muscles; normal breath sounds bilaterally - no crackles and no wheezes.    Heart:  normal rate, regular rhythm, no murmur, no gallop, no rub, and no JVD.   Msk:  right knee: full range of motion, min joint effusion with fullness posterior knee. no erythema or abnormal warmth. Stable to ligamentous testing. Nontender to palpation. Neurovascularly intact.    Impression & Recommendations:  Problem # 1:  KNEE PAIN, RIGHT, ACUTE (ICD-719.46)  onset posterior swelling 3 days ago - suspect baker's cyst -  no precipitating trauma check xray now -  also refer to ortho for consideration of aspiration vs steroid injection into joint space tylenol as needed  Her updated medication list for this problem includes:    Aspirin 81 Mg Tbec (Aspirin) ..... One by mouth every day  Orders: Orthopedic Surgeon Referral (Ortho Surgeon) Edilia Bo Right 2 view ([id number])  Discussed strengthening exercises, use of ice or heat, and medications.   Complete Medication List: 1)  Klor-con M20 20 Meq Tbcr (Potassium chloride  crys cr) .... Two times a day 2)  Fexofenadine Hcl 180 Mg Tabs (Fexofenadine hcl) .... Once daily 3)  Nasonex 50 Mcg/act Susp (Mometasone furoate) .Marland Kitchen.. 1 spray each nostril once daily 4)  Advair Diskus 100-50 Mcg/dose Misc (Fluticasone-salmeterol) .... One puff two times a day for asthma 5)  Epipen 0.3 Mg/0.43ml (1:1000) Devi (Epinephrine hcl (anaphylaxis)) .... As needed 6)  Anusol-hc 25 Mg Supp (Hydrocortisone acetate) .... Insert 1 suppository into rectum once a day 7)  Glipizide-metformin Hcl 2.5-500 Mg Tabs (Glipizide-metformin hcl) .Marland Kitchen.. 1 by mouth two times a day 8)  Prevacid 30 Mg Cpdr (Lansoprazole) .... One by mouth daily 9)  Hyzaar 100-12.5 Mg Tabs (Losartan potassium-hctz) .Marland Kitchen.. 1 by mouth daily 10)  Aspirin 81 Mg Tbec (Aspirin) .... One by mouth every day 11)  Fluoxetine Hcl 10 Mg Tabs (Fluoxetine hcl) .Marland Kitchen.. 1 by mouth qd  12)  Amlodipine Besylate 5 Mg Tabs (Amlodipine besylate) .Marland Kitchen.. 1 by mouth every day 13)  Vitamin D3 1000 Unit Tabs (Cholecalciferol) .Marland Kitchen.. 1 by mouth daily 14)  Promethazine-dm 6.25-15 Mg/54ml Syrp (Promethazine-dm) .... 5-10 ml by mouth qid as needed for cough 15)  Proair Hfa 108 (90 Base) Mcg/act Aers (Albuterol sulfate) .Marland Kitchen.. 1-2 puffs qid as needed for wheezing  Patient Instructions: 1)  it was good to see you today. 2)  xray ordered today - your results will be called to you after review 3)  we'll make referral to orthopedics to consider injection for your knee swelling and probable baker's cyst. Our office will contact you regarding this appointment once made.  4)  Take 650-1000mg  of Tylenol every 4-6 hours as needed for relief of pain or comfort of fever AVOID taking more than 4000mg   in a 24 hour period (can cause liver damage in higher doses).

## 2010-11-19 NOTE — Assessment & Plan Note (Signed)
Summary: 4 mo rov /nws  #   Vital Signs:  Patient profile:   65 year old female Height:      64 inches Weight:      196.50 pounds BMI:     33.85 O2 Sat:      98 % on Room air Temp:     98.1 degrees F oral Pulse rate:   50 / minute BP sitting:   126 / 70  (left arm) Cuff size:   regular  Vitals Entered ByZella Ball Ewing (Mar 12, 2010 11:12 AM)  O2 Flow:  Room air CC: 4 month ROV/RE   Primary Care Provider:  Dr. Posey Rea  CC:  4 month ROV/RE.  History of Present Illness: The patient presents for a follow up of hypertension, diabetes, hyperlipidemia C/o LBP off and on. C/o cold sweats periodically x months   Current Medications (verified): 1)  Klor-Con M20 20 Meq  Tbcr (Potassium Chloride Crys Cr) .... Two Times A Day 2)  Fexofenadine Hcl 180 Mg  Tabs (Fexofenadine Hcl) .... Once Daily 3)  Nasonex 50 Mcg/act  Susp (Mometasone Furoate) .Marland Kitchen.. 1 Spray Each Nostril Once Daily 4)  Advair Diskus 100-50 Mcg/dose  Misc (Fluticasone-Salmeterol) .... One Puff Two Times A Day For Asthma 5)  Epipen 0.3 Mg/0.62ml (1:1000)  Devi (Epinephrine Hcl (Anaphylaxis)) .... As Needed 6)  Anusol-Hc 25 Mg Supp (Hydrocortisone Acetate) .... Insert 1 Suppository Into Rectum Once A Day 7)  Glipizide-Metformin Hcl 2.5-500 Mg  Tabs (Glipizide-Metformin Hcl) .Marland Kitchen.. 1 By Mouth Two Times A Day 8)  Prevacid 30 Mg Cpdr (Lansoprazole) .... One By Mouth Daily 9)  Hyzaar 100-12.5 Mg  Tabs (Losartan Potassium-Hctz) .Marland Kitchen.. 1 By Mouth Daily 10)  Aspirin 81 Mg  Tbec (Aspirin) .... One By Mouth Every Day 11)  Fluoxetine Hcl 10 Mg Tabs (Fluoxetine Hcl) .Marland Kitchen.. 1 By Mouth Qd 12)  Amlodipine Besylate 5 Mg  Tabs (Amlodipine Besylate) .Marland Kitchen.. 1 By Mouth Every Day 13)  Vitamin D3 1000 Unit  Tabs (Cholecalciferol) .Marland Kitchen.. 1 By Mouth Daily 14)  Promethazine-Codeine 6.25-10 Mg/60ml Syrp (Promethazine-Codeine) .... 5-10 Ml By Mouth Q Id As Needed Cough  Allergies (verified): 1)  ! Dynacirc Cr (Isradipine) 2)  ! Maxzide 3)  ! Lovastatin  (Lovastatin) 4)  ! Vytorin (Ezetimibe-Simvastatin) 5)  ! * Fish Oil 6)  ! Sulfadiazine (Sulfadiazine) 7)  Clonidine Hcl (Clonidine Hcl) 8)  * Dynacire 9)  * Maxide 10)  Sulfamethoxazole (Sulfamethoxazole) 11)  Metformin Hcl (Metformin Hcl)  Past History:  Past Medical History: Last updated: 01/21/2010 Diabetes mellitus, type II Hypertension IBS  Dr Juanda Chance Eczema Food allergies Hemorhoids Hyperlipidemia Asthma Allergic rhinitis  MD rooster - allg - [facility] -brodie  Social History: Last updated: 07/03/2009 Retired, workig part time [employer] Divorced Never Smoked  Family History: Reviewed history from 02/16/2008 and no changes required. Mother - allergies  Social History: Reviewed history from 07/03/2009 and no changes required. Retired, Materials engineer part time [employer] Divorced Never Smoked  Review of Systems  The patient denies fever, chest pain, prolonged cough, and abdominal pain.    Physical Exam  General:  She is obese.  NAD Nose:  External nasal examination shows no deformity or inflammation. Nasal mucosa are pink and moist without lesions or exudates. Mouth:  +upper lip swelling L>R, no tounge edema - OP clear without swelling or compromise Neck:  supple, full ROM, and no masses.   Lungs:  normal respiratory effort, no intercostal retractions or use of accessory muscles; normal breath sounds bilaterally -  no crackles and no wheezes.    Heart:  normal rate, regular rhythm, no murmur, and no rub. BLE without edema. Abdomen:  soft, non-tender, normal bowel sounds, no distention, no masses, no guarding, no rigidity, no rebound tenderness, no abdominal hernia, no inguinal hernia, no hepatomegaly, and no splenomegaly.   Msk:  No deformity or scoliosis noted of thoracic or lumbar spine.  Lumbar-sacral spine is tender to palpation over paraspinal muscles and painfull with the ROM  Extremities:  No edema Neurologic:  No cranial nerve deficits noted. Station and gait  are normal. Plantar reflexes are down-going bilaterally. DTRs are symmetrical throughout. Sensory, motor and coordinative functions appear intact. Skin:  no rashes, vesicles, ulcers, or erythema. No nodules or irregularity to palpation.    Impression & Recommendations:  Problem # 1:  SWEATING (ICD-780.8) Assessment Unchanged Treat UTI Orders: TLB-A1C / Hgb A1C (Glycohemoglobin) (83036-A1C) TLB-B12, Serum-Total ONLY (25956-L87) TLB-BMP (Basic Metabolic Panel-BMET) (80048-METABOL) TLB-Hepatic/Liver Function Pnl (80076-HEPATIC) TLB-CBC Platelet - w/Differential (85025-CBCD) TLB-TSH (Thyroid Stimulating Hormone) (84443-TSH) TLB-Udip ONLY (81003-UDIP)  Problem # 2:  LOW BACK PAIN, CHRONIC (ICD-724.2) Assessment: Unchanged  Her updated medication list for this problem includes:    Aspirin 81 Mg Tbec (Aspirin) ..... One by mouth every day  Orders: TLB-A1C / Hgb A1C (Glycohemoglobin) (83036-A1C) TLB-B12, Serum-Total ONLY (56433-I95) TLB-BMP (Basic Metabolic Panel-BMET) (80048-METABOL) TLB-Hepatic/Liver Function Pnl (80076-HEPATIC) TLB-CBC Platelet - w/Differential (85025-CBCD) TLB-TSH (Thyroid Stimulating Hormone) (84443-TSH) TLB-Udip ONLY (81003-UDIP)  Problem # 3:  HYPERTENSION (ICD-401.9) Assessment: Unchanged  Her updated medication list for this problem includes:    Hyzaar 100-12.5 Mg Tabs (Losartan potassium-hctz) .Marland Kitchen... 1 by mouth daily    Amlodipine Besylate 5 Mg Tabs (Amlodipine besylate) .Marland Kitchen... 1 by mouth every day  BP today: 126/70 Prior BP: 132/70 (01/21/2010)  Labs Reviewed: K+: 4.0 (10/31/2009) Creat: : 1.0 (10/31/2009)   Chol: 151 (01/09/2009)   HDL: 49.90 (01/09/2009)   LDL: 89 (01/09/2009)   TG: 59.0 (01/09/2009)  Problem # 4:  DIABETES MELLITUS, TYPE II (ICD-250.00) Assessment: Unchanged  Her updated medication list for this problem includes:    Glipizide-metformin Hcl 2.5-500 Mg Tabs (Glipizide-metformin hcl) .Marland Kitchen... 1 by mouth two times a day    Hyzaar  100-12.5 Mg Tabs (Losartan potassium-hctz) .Marland Kitchen... 1 by mouth daily    Aspirin 81 Mg Tbec (Aspirin) ..... One by mouth every day  Orders: TLB-A1C / Hgb A1C (Glycohemoglobin) (83036-A1C) TLB-B12, Serum-Total ONLY (18841-Y60) TLB-BMP (Basic Metabolic Panel-BMET) (80048-METABOL) TLB-Hepatic/Liver Function Pnl (80076-HEPATIC) TLB-CBC Platelet - w/Differential (85025-CBCD) TLB-TSH (Thyroid Stimulating Hormone) (84443-TSH) TLB-Udip ONLY (81003-UDIP)  Labs Reviewed: Creat: 1.0 (10/31/2009)    Reviewed HgBA1c results: 7.3 (10/31/2009)  6.6 (07/03/2009)  Problem # 5:  CYSTITIS (ICD-595.9) Assessment: New  Her updated medication list for this problem includes:    Ciprofloxacin Hcl 250 Mg Tabs (Ciprofloxacin hcl) .Marland Kitchen... 1 by mouth two times a day for cystitis  Complete Medication List: 1)  Klor-con M20 20 Meq Tbcr (Potassium chloride crys cr) .... Two times a day 2)  Fexofenadine Hcl 180 Mg Tabs (Fexofenadine hcl) .... Once daily 3)  Nasonex 50 Mcg/act Susp (Mometasone furoate) .Marland Kitchen.. 1 spray each nostril once daily 4)  Advair Diskus 100-50 Mcg/dose Misc (Fluticasone-salmeterol) .... One puff two times a day for asthma 5)  Epipen 0.3 Mg/0.42ml (1:1000) Devi (Epinephrine hcl (anaphylaxis)) .... As needed 6)  Anusol-hc 25 Mg Supp (Hydrocortisone acetate) .... Insert 1 suppository into rectum once a day 7)  Glipizide-metformin Hcl 2.5-500 Mg Tabs (Glipizide-metformin hcl) .Marland Kitchen.. 1 by mouth two times a  day 8)  Prevacid 30 Mg Cpdr (Lansoprazole) .... One by mouth daily 9)  Hyzaar 100-12.5 Mg Tabs (Losartan potassium-hctz) .Marland Kitchen.. 1 by mouth daily 10)  Aspirin 81 Mg Tbec (Aspirin) .... One by mouth every day 11)  Fluoxetine Hcl 10 Mg Tabs (Fluoxetine hcl) .Marland Kitchen.. 1 by mouth qd 12)  Amlodipine Besylate 5 Mg Tabs (Amlodipine besylate) .Marland Kitchen.. 1 by mouth every day 13)  Vitamin D3 1000 Unit Tabs (Cholecalciferol) .Marland Kitchen.. 1 by mouth daily 14)  Ciprofloxacin Hcl 250 Mg Tabs (Ciprofloxacin hcl) .Marland Kitchen.. 1 by mouth two times a  day for cystitis  Patient Instructions: 1)  Please schedule a follow-up appointment in 4 months well w/labs and A1c 250.00  v70.0. 2)  Use stretching and balance exercises that I have provided (15 min. or longer every day) Prescriptions: CIPROFLOXACIN HCL 250 MG TABS (CIPROFLOXACIN HCL) 1 by mouth two times a day for cystitis  #10 x 0   Entered and Authorized by:   Tresa Garter MD   Signed by:   Tresa Garter MD on 03/13/2010   Method used:   Print then Give to Patient   RxID:   [id number] FEXOFENADINE HCL 180 MG  TABS (FEXOFENADINE HCL) once daily  #90 x 3   Entered and Authorized by:   Tresa Garter MD   Signed by:   Tresa Garter MD on 03/12/2010   Method used:   Print then Give to Patient   RxID:   [id number]

## 2010-11-19 NOTE — Assessment & Plan Note (Signed)
Summary: 2 WK ROV /NWS   Vital Signs:  Patient profile:   65 year old female Height:      64 inches Weight:      190 pounds BMI:     32.73 O2 Sat:      97 % on Room air Pulse rate:   55 / minute Pulse rhythm:   regular Resp:     16 per minute BP sitting:   130 / 72  (left arm) Cuff size:   regular  Vitals Entered By: Lanier Prude, CMA(AAMA) (May 08, 2010 8:14 AM)  O2 Flow:  Room air CC: 2 wk f/u Is Patient Diabetic? Yes   Primary Care Provider:  Dr. Posey Rea  CC:  2 wk f/u.  History of Present Illness: The patient presents for a follow up of hypertension, diabetes, hyperlipidemia C/o GERD, hoarsness C/o rash on R buttock  Current Medications (verified): 1)  Klor-Con M20 20 Meq  Tbcr (Potassium Chloride Crys Cr) .... Two Times A Day 2)  Fexofenadine Hcl 180 Mg  Tabs (Fexofenadine Hcl) .... Once Daily 3)  Nasonex 50 Mcg/act  Susp (Mometasone Furoate) .Marland Kitchen.. 1 Spray Each Nostril Once Daily 4)  Advair Diskus 100-50 Mcg/dose  Misc (Fluticasone-Salmeterol) .... One Puff Two Times A Day For Asthma 5)  Epipen 0.3 Mg/0.17ml (1:1000)  Devi (Epinephrine Hcl (Anaphylaxis)) .... As Needed 6)  Anusol-Hc 25 Mg Supp (Hydrocortisone Acetate) .... Insert 1 Suppository Into Rectum Once A Day 7)  Glipizide-Metformin Hcl 2.5-500 Mg  Tabs (Glipizide-Metformin Hcl) .Marland Kitchen.. 1 By Mouth Two Times A Day 8)  Prevacid 30 Mg Cpdr (Lansoprazole) .... One By Mouth Daily 9)  Hyzaar 100-12.5 Mg  Tabs (Losartan Potassium-Hctz) .Marland Kitchen.. 1 By Mouth Daily 10)  Aspirin 81 Mg  Tbec (Aspirin) .... One By Mouth Every Day 11)  Fluoxetine Hcl 10 Mg Tabs (Fluoxetine Hcl) .Marland Kitchen.. 1 By Mouth Qd 12)  Amlodipine Besylate 5 Mg  Tabs (Amlodipine Besylate) .Marland Kitchen.. 1 By Mouth Every Day 13)  Vitamin D3 1000 Unit  Tabs (Cholecalciferol) .Marland Kitchen.. 1 By Mouth Daily 14)  Promethazine-Dm 6.25-15 Mg/29ml Syrp (Promethazine-Dm) .... 5-10 Ml By Mouth Qid As Needed For Cough 15)  Proair Hfa 108 (90 Base) Mcg/act Aers (Albuterol Sulfate) .Marland Kitchen.. 1-2  Puffs Qid As Needed For Wheezing  Allergies (verified): 1)  ! Dynacirc Cr (Isradipine) 2)  ! Maxzide 3)  ! Lovastatin (Lovastatin) 4)  ! Vytorin (Ezetimibe-Simvastatin) 5)  ! * Fish Oil 6)  ! Sulfadiazine (Sulfadiazine) 7)  ! Codeine 8)  ! Codeine 9)  Clonidine Hcl (Clonidine Hcl) 10)  * Dynacire 11)  Metformin Hcl (Metformin Hcl) 12)  * Maxide 13)  Sulfamethoxazole (Sulfamethoxazole)  Past History:  Past Medical History: Diabetes mellitus, type II Hypertension IBS  Dr Juanda Chance Eczema Food allergies Hemorhoids Hyperlipidemia Asthma Allergic rhinitis Statin intolerant MD rooster - allg - [facility] -bro[facility]  Review of Systems  The patient denies fever, weight loss, weight gain, chest pain, dyspnea on exertion, abdominal pain, and melena.    Physical Exam  General:  alert, well-developed, well-nourished, well-hydrated, appropriate dress, normal appearance, healthy-appearing, cooperative to examination, and good hygiene.   Nose:  no external deformity, no airflow obstruction, no intranasal foreign body, no nasal polyps, no nasal mucosal lesions, no mucosal friability, no active bleeding or clots, no sinus percussion tenderness, no septum abnormalities, and nasal dischargemucosal pallor.   Mouth:  Oral mucosa and oropharynx without lesions or exudates.  Teeth in good repair. Neck:  supple, full ROM, no masses, no thyromegaly,  no thyroid nodules or tenderness, no JVD, normal carotid upstroke, no carotid bruits, no cervical lymphadenopathy, and no neck tenderness.   Lungs:  normal respiratory effort, no intercostal retractions, no accessory muscle use, normal breath sounds, no dullness, no fremitus, no crackles, and no wheezes.   Heart:  normal rate, regular rhythm, no murmur, no gallop, no rub, and no JVD.   Abdomen:  soft, non-tender, normal bowel sounds, no distention, no masses, no guarding, no rigidity, no rebound tenderness, no hepatomegaly, and no splenomegaly.   Msk:  No  deformity or scoliosis noted of thoracic or lumbar spine.   Extremities:  No clubbing, cyanosis, edema, or deformity noted with normal full range of motion of all joints.   Neurologic:  No cranial nerve deficits noted. Station and gait are normal. Plantar reflexes are down-going bilaterally. DTRs are symmetrical throughout. Sensory, motor and coordinative functions appear intact. Skin:  R buttock w/a bruise and 3 healing blisters Psych:  Cognition and judgment appear intact. Alert and cooperative with normal attention span and concentration. No apparent delusions, illusions, hallucinations   Impression & Recommendations:  Problem # 1:  DIABETES MELLITUS, TYPE II (ICD-250.00) Assessment Unchanged  Her updated medication list for this problem includes:    Glipizide-metformin Hcl 2.5-500 Mg Tabs (Glipizide-metformin hcl) .Marland Kitchen... 1 by mouth two times a day    Hyzaar 100-12.5 Mg Tabs (Losartan potassium-hctz) .Marland Kitchen... 1 by mouth daily    Aspirin 81 Mg Tbec (Aspirin) ..... One by mouth every day  Labs Reviewed: Creat: 0.8 (03/12/2010)    Reviewed HgBA1c results: 7.2 (03/12/2010)  7.3 (10/31/2009)  Problem # 2:  HYPERTENSION (ICD-401.9) Assessment: Improved  Her updated medication list for this problem includes:    Hyzaar 100-12.5 Mg Tabs (Losartan potassium-hctz) .Marland Kitchen... 1 by mouth daily    Amlodipine Besylate 5 Mg Tabs (Amlodipine besylate) .Marland Kitchen... 1 by mouth every day  BP today: 130/72 Prior BP: 140/70 (04/16/2010)  Labs Reviewed: K+: 3.6 (03/12/2010) Creat: : 0.8 (03/12/2010)   Chol: 151 (01/09/2009)   HDL: 49.90 (01/09/2009)   LDL: 89 (01/09/2009)   TG: 59.0 (01/09/2009)  Problem # 3:  ASTHMA (ICD-493.90) Assessment: Comment Only  Her updated medication list for this problem includes:    Advair Diskus 100-50 Mcg/dose Misc (Fluticasone-salmeterol) ..... One puff two times a day for asthma    Proair Hfa 108 (90 Base) Mcg/act Aers (Albuterol sulfate) .Marland Kitchen... 1-2 puffs qid as needed for  wheezing  Problem # 4:  HYPERLIPIDEMIA (ICD-272.4) Assessment: Comment Only Discussed. Statin intol.  Problem # 5:  FATIGUE (ICD-780.79) Assessment: Improved  Problem # 6:  SKIN RASH (ICD-782.1) on buttocks - poss resolving H zoster  Assessment: New No pain Her updated medication list for this problem includes:    Triamcinolone Acetonide 0.5 % Crea (Triamcinolone acetonide) ..... Use two times a day prn  Complete Medication List: 1)  Klor-con M20 20 Meq Tbcr (Potassium chloride crys cr) .... Two times a day 2)  Fexofenadine Hcl 180 Mg Tabs (Fexofenadine hcl) .... Once daily 3)  Nasonex 50 Mcg/act Susp (Mometasone furoate) .Marland Kitchen.. 1 spray each nostril once daily 4)  Advair Diskus 100-50 Mcg/dose Misc (Fluticasone-salmeterol) .... One puff two times a day for asthma 5)  Epipen 0.3 Mg/0.3ml (1:1000) Devi (Epinephrine hcl (anaphylaxis)) .... As needed 6)  Anusol-hc 25 Mg Supp (Hydrocortisone acetate) .... Insert 1 suppository into rectum once a day 7)  Glipizide-metformin Hcl 2.5-500 Mg Tabs (Glipizide-metformin hcl) .Marland Kitchen.. 1 by mouth two times a day 8)  Prevacid 30 Mg  Cpdr (Lansoprazole) .... One by mouth daily 9)  Hyzaar 100-12.5 Mg Tabs (Losartan potassium-hctz) .Marland Kitchen.. 1 by mouth daily 10)  Aspirin 81 Mg Tbec (Aspirin) .... One by mouth every day 11)  Fluoxetine Hcl 10 Mg Tabs (Fluoxetine hcl) .Marland Kitchen.. 1 by mouth qd 12)  Amlodipine Besylate 5 Mg Tabs (Amlodipine besylate) .Marland Kitchen.. 1 by mouth every day 13)  Vitamin D3 1000 Unit Tabs (Cholecalciferol) .Marland Kitchen.. 1 by mouth daily 14)  Promethazine-dm 6.25-15 Mg/26ml Syrp (Promethazine-dm) .... 5-10 ml by mouth qid as needed for cough 15)  Proair Hfa 108 (90 Base) Mcg/act Aers (Albuterol sulfate) .Marland Kitchen.. 1-2 puffs qid as needed for wheezing 16)  Triamcinolone Acetonide 0.5 % Crea (Triamcinolone acetonide) .... Use two times a day prn  Patient Instructions: 1)  Please schedule a follow-up appointment in 3 months well w/labs and  2)  BMP prior to visit,  ICD-9: 3)  Hepatic Panel prior to visit, ICD-9: 4)  Lipid Panel prior to visit, ICD-9:250.00 995.20 5)  HbgA1C prior to visit, ICD-9: 6)  Urine-dip prior to visit, ICD-9: 7)  Urine Microalbumin prior to visit, ICD-9: Prescriptions: TRIAMCINOLONE ACETONIDE 0.5 % CREA (TRIAMCINOLONE ACETONIDE) use two times a day prn  #60 g x 3   Entered and Authorized by:   Tresa Garter MD   Signed by:   Tresa Garter MD on 05/12/2010   Method used:   Electronically to        [facility] (retail)       [address]       [place], [place]  [address]       Ph: [phone]       Fax: [phone]   RxID:   [id number]

## 2010-11-19 NOTE — Letter (Signed)
Summary: [facility]  [facility]   Imported By: [place] 07/22/2010 09:18:08  _____________________________________________________________________  External Attachment:    Type:   Image     Comment:   External Document

## 2010-11-19 NOTE — Assessment & Plan Note (Signed)
Summary: lip & jaw swelling / SD   Vital Signs:  Patient profile:   65 year old female Height:      64 inches (162.56 cm) Weight:      192.0 pounds (87.27 kg) BMI:     33.08 O2 Sat:      96 % on Room air Temp:     98.1 degrees F (36.72 degrees C) oral Pulse rate:   64 / minute BP sitting:   132 / 70  (left arm) Cuff size:   regular  Vitals Entered By: Orlan Leavens (January 21, 2010 11:25 AM)  O2 Flow:  Room air CC: (R) Lip & jaw swelling. pt states started this am. Tingley feeling Is Patient Diabetic? Yes Did you bring your meter with you today? No Pain Assessment Patient in pain? no        Primary Care Provider:  Dr. Posey Rea  CC:  (R) Lip & jaw swelling. pt states started this am. Tingley feeling.  History of Present Illness: c/o lip swelling - onset this AM while on bus (<4 hours ago) +hx same - many many allergies reviewed - no tounge involvement, no trouble swalow or speaking -  no SOB or trouble breathing ?food exposure vs new lipstick as trigger - denies med changes - took oral allg med this AM (?claritin or zyrtec)  Current Medications (verified): 1)  Klor-Con M20 20 Meq  Tbcr (Potassium Chloride Crys Cr) .... Two Times A Day 2)  Fexofenadine Hcl 180 Mg  Tabs (Fexofenadine Hcl) .... Once Daily 3)  Nasonex 50 Mcg/act  Susp (Mometasone Furoate) .Marland Kitchen.. 1 Spray Each Nostril Once Daily 4)  Advair Diskus 100-50 Mcg/dose  Misc (Fluticasone-Salmeterol) .... One Puff Two Times A Day For Asthma 5)  Epipen 0.3 Mg/0.78ml (1:1000)  Devi (Epinephrine Hcl (Anaphylaxis)) .... As Needed 6)  Anusol-Hc 25 Mg Supp (Hydrocortisone Acetate) .... Insert 1 Suppository Into Rectum Once A Day 7)  Glipizide-Metformin Hcl 2.5-500 Mg  Tabs (Glipizide-Metformin Hcl) .Marland Kitchen.. 1 By Mouth Two Times A Day 8)  Prevacid 30 Mg Cpdr (Lansoprazole) .... One By Mouth Daily 9)  Hyzaar 100-12.5 Mg  Tabs (Losartan Potassium-Hctz) .Marland Kitchen.. 1 By Mouth Daily 10)  Aspirin 81 Mg  Tbec (Aspirin) .... One By Mouth Every  Day 11)  Fluoxetine Hcl 10 Mg Tabs (Fluoxetine Hcl) .Marland Kitchen.. 1 By Mouth Qd 12)  Amlodipine Besylate 5 Mg  Tabs (Amlodipine Besylate) .Marland Kitchen.. 1 By Mouth Every Day 13)  Vitamin D3 1000 Unit  Tabs (Cholecalciferol) .Marland Kitchen.. 1 By Mouth Daily 14)  Promethazine-Codeine 6.25-10 Mg/6ml Syrp (Promethazine-Codeine) .... 5-10 Ml By Mouth Q Id As Needed Cough  Allergies (verified): 1)  ! Dynacirc Cr (Isradipine) 2)  ! Maxzide 3)  ! Lovastatin (Lovastatin) 4)  ! Vytorin (Ezetimibe-Simvastatin) 5)  ! * Fish Oil 6)  ! Sulfadiazine (Sulfadiazine) 7)  Clonidine Hcl (Clonidine Hcl) 8)  * Dynacire 9)  * Maxide 10)  Sulfamethoxazole (Sulfamethoxazole) 11)  Metformin Hcl (Metformin Hcl)  Past History:  Past Medical History: Diabetes mellitus, type II Hypertension IBS  Dr Juanda Chance Eczema Food allergies Hemorhoids Hyperlipidemia Asthma Allergic rhinitis  MD rooster - allg - [facility] -brodie  Review of Systems  The patient denies fever, weight loss, hoarseness, chest pain, syncope, peripheral edema, headaches, and abdominal pain.    Physical Exam  General:  obese.  NAD Eyes:  vision grossly intact; pupils equal, round and reactive to light.  conjunctiva and lids normal.    Mouth:  +upper lip swelling L>R,  no tounge edema - OP clear without swelling or compromise Lungs:  normal respiratory effort, no intercostal retractions or use of accessory muscles; normal breath sounds bilaterally - no crackles and no wheezes.    Heart:  normal rate, regular rhythm, no murmur, and no rub. BLE without edema. Skin:  no rashes, vesicles, ulcers, or erythema. No nodules or irregularity to palpation.    Impression & Recommendations:  Problem # 1:  ANGIOEDEMA (ICD-995.1)  steroid shot - done cont antihistamine and observation - no tounge or OP involvement and no resp signs present - reviewed warning signs for which to go to ER -  f/u with allergist as needed for multiple environemnt and food allg -   Orders: Depo-  Medrol 80mg  (J1040) Admin of Therapeutic Inj  intramuscular or subcutaneous (16109)  Problem # 2:  ALLERGY, FOOD (ICD-693.1)  Problem # 3:  DIABETES MELLITUS, TYPE II (ICD-250.00) advised to watch sugars s/p medrol - pt aware and agrees Her updated medication list for this problem includes:    Glipizide-metformin Hcl 2.5-500 Mg Tabs (Glipizide-metformin hcl) .Marland Kitchen... 1 by mouth two times a day    Hyzaar 100-12.5 Mg Tabs (Losartan potassium-hctz) .Marland Kitchen... 1 by mouth daily    Aspirin 81 Mg Tbec (Aspirin) ..... One by mouth every day  Labs Reviewed: Creat: 1.0 (10/31/2009)    Reviewed HgBA1c results: 7.3 (10/31/2009)  6.6 (07/03/2009)  Complete Medication List: 1)  Klor-con M20 20 Meq Tbcr (Potassium chloride crys cr) .... Two times a day 2)  Fexofenadine Hcl 180 Mg Tabs (Fexofenadine hcl) .... Once daily 3)  Nasonex 50 Mcg/act Susp (Mometasone furoate) .Marland Kitchen.. 1 spray each nostril once daily 4)  Advair Diskus 100-50 Mcg/dose Misc (Fluticasone-salmeterol) .... One puff two times a day for asthma 5)  Epipen 0.3 Mg/0.87ml (1:1000) Devi (Epinephrine hcl (anaphylaxis)) .... As needed 6)  Anusol-hc 25 Mg Supp (Hydrocortisone acetate) .... Insert 1 suppository into rectum once a day 7)  Glipizide-metformin Hcl 2.5-500 Mg Tabs (Glipizide-metformin hcl) .Marland Kitchen.. 1 by mouth two times a day 8)  Prevacid 30 Mg Cpdr (Lansoprazole) .... One by mouth daily 9)  Hyzaar 100-12.5 Mg Tabs (Losartan potassium-hctz) .Marland Kitchen.. 1 by mouth daily 10)  Aspirin 81 Mg Tbec (Aspirin) .... One by mouth every day 11)  Fluoxetine Hcl 10 Mg Tabs (Fluoxetine hcl) .Marland Kitchen.. 1 by mouth qd 12)  Amlodipine Besylate 5 Mg Tabs (Amlodipine besylate) .Marland Kitchen.. 1 by mouth every day 13)  Vitamin D3 1000 Unit Tabs (Cholecalciferol) .Marland Kitchen.. 1 by mouth daily 14)  Promethazine-codeine 6.25-10 Mg/37ml Syrp (Promethazine-codeine) .... 5-10 ml by mouth q id as needed cough  Patient Instructions: 1)  it was good to see you today. 2)  you have been given a steroid  "medrol" shot today for the allergic reaction - if your symptoms continue to worsen (swelling, fever, rash or touble breathing or talking or swallowing), or if you are unable take anything by mouth (pills, fluids, etc), you should go to the emergency room for further evaluation and treatment.  3)  continue to take your allergy medication as usual - followup with dr. Corinda Gubler as needed  4)  work note for today provided - 5)  Please schedule a follow-up appointment as needed.   Medication Administration  Injection # 1:    Medication: Depo- Medrol 80mg     Diagnosis: ANGIOEDEMA (ICD-995.1)    Route: IM    Site: RUOQ gluteus    Exp Date: 08/2012    Lot #: [id number]    Mfr: [employer]acia  Patient tolerated injection without complications    Given by: Orlan Leavens (January 21, 2010 12:47 PM)  Orders Added: 1)  Est. Patient Level IV [16109] 2)  Depo- Medrol 80mg  [J1040] 3)  Admin of Therapeutic Inj  intramuscular or subcutaneous [60454]

## 2010-11-19 NOTE — Assessment & Plan Note (Signed)
Summary: 3 MO ROV /NWS  #   Vital Signs:  Patient profile:   65 year old female Height:      64 inches Weight:      197 pounds BMI:     33.94 Temp:     98.7 degrees F oral Pulse rate:   76 / minute Pulse rhythm:   regular Resp:     16 per minute BP sitting:   142 / 90  (left arm) Cuff size:   regular  Vitals Entered By: Lanier Prude, CMA(AAMA) (August 12, 2010 10:08 AM) CC: 3 mo f/u  Is Patient Diabetic? No   Primary Care Provider:  Dr. Posey Rea  CC:  3 mo f/u .  History of Present Illness: The patient presents for a follow up of hypertension, diabetes, hyperlipidemia. Knee OA is better w/Nabumetone by Dr Charlett Blake   Preventive Screening-Counseling & Management  Alcohol-Tobacco     Alcohol drinks/day: 0     Smoking Status: never  Current Medications (verified): 1)  Klor-Con M20 20 Meq  Tbcr (Potassium Chloride Crys Cr) .... Two Times A Day 2)  Fexofenadine Hcl 180 Mg  Tabs (Fexofenadine Hcl) .... Once Daily 3)  Nasonex 50 Mcg/act  Susp (Mometasone Furoate) .Marland Kitchen.. 1 Spray Each Nostril Once Daily 4)  Advair Diskus 100-50 Mcg/dose  Misc (Fluticasone-Salmeterol) .... One Puff Two Times A Day For Asthma 5)  Epipen 0.3 Mg/0.44ml (1:1000)  Devi (Epinephrine Hcl (Anaphylaxis)) .... As Needed 6)  Anusol-Hc 25 Mg Supp (Hydrocortisone Acetate) .... Insert 1 Suppository Into Rectum Once A Day 7)  Glipizide-Metformin Hcl 2.5-500 Mg  Tabs (Glipizide-Metformin Hcl) .Marland Kitchen.. 1 By Mouth Two Times A Day 8)  Prevacid 30 Mg Cpdr (Lansoprazole) .... One By Mouth Daily 9)  Hyzaar 100-12.5 Mg  Tabs (Losartan Potassium-Hctz) .Marland Kitchen.. 1 By Mouth Daily 10)  Aspirin 81 Mg  Tbec (Aspirin) .... One By Mouth Every Day 11)  Fluoxetine Hcl 10 Mg Tabs (Fluoxetine Hcl) .Marland Kitchen.. 1 By Mouth Qd 12)  Amlodipine Besylate 5 Mg  Tabs (Amlodipine Besylate) .Marland Kitchen.. 1 By Mouth Every Day 13)  Vitamin D3 1000 Unit  Tabs (Cholecalciferol) .Marland Kitchen.. 1 By Mouth Daily 14)  Promethazine-Dm 6.25-15 Mg/31ml Syrp (Promethazine-Dm) .... 5-10 Ml  By Mouth Qid As Needed For Cough 15)  Proair Hfa 108 (90 Base) Mcg/act Aers (Albuterol Sulfate) .Marland Kitchen.. 1-2 Puffs Qid As Needed For Wheezing 16)  Nabumetone 750 Mg Tabs (Nabumetone) .Marland Kitchen.. 1 By Mouth Once Daily  Allergies (verified): 1)  ! Dynacirc Cr (Isradipine) 2)  ! Maxzide 3)  ! Lovastatin (Lovastatin) 4)  ! Vytorin (Ezetimibe-Simvastatin) 5)  ! * Fish Oil 6)  ! Sulfadiazine (Sulfadiazine) 7)  ! Codeine 8)  ! Codeine 9)  Clonidine Hcl (Clonidine Hcl) 10)  * Dynacire 11)  Metformin Hcl (Metformin Hcl) 12)  * Maxide 13)  Sulfamethoxazole (Sulfamethoxazole)  Past History:  Past Medical History: Last updated: 07/09/2010 Diabetes mellitus, type II Hypertension IBS  Dr Juanda Chance Eczema Food allergies Hemorhoids Hyperlipidemia Asthma Allergic rhinitis Statin intolerant  MD roster - allg - [facility] -bro[facility]  Social History: Last updated: 07/09/2010 Retired, working part time school bus Quilting Divorced Never Smoked  Review of Systems       The patient complains of weight gain.  The patient denies fever, chest pain, dyspnea on exertion, and abdominal pain.    Physical Exam  General:  alert, well-developed, well-hydrated, appropriate dress, normal appearance, healthy-appearing, cooperative to examination, and good hygiene.  overweight-appearing.   Nose:  no external deformity, no  airflow obstruction, no intranasal foreign body, no nasal polyps, no nasal mucosal lesions, no mucosal friability, no active bleeding or clots, no sinus percussion tenderness, no septum abnormalities, and nasal dischargemucosal pallor.   Mouth:  Oral mucosa and oropharynx without lesions or exudates.  Teeth in good repair. Lungs:  normal respiratory effort, no intercostal retractions or use of accessory muscles; normal breath sounds bilaterally - no crackles and no wheezes.    Heart:  normal rate, regular rhythm, no murmur, no gallop, no rub, and no JVD.   Abdomen:  soft, non-tender, normal bowel  sounds, no distention, no masses, no guarding, no rigidity, no rebound tenderness, no hepatomegaly, and no splenomegaly.   Msk:  right knee: full range of motion, min joint effusion with fullness posterior knee. no erythema or abnormal warmth. Stable to ligamentous testing. Nontender to palpation. Neurovascularly intact.  Extremities:  No clubbing, cyanosis, edema, or deformity noted with normal full range of motion of all joints.   Neurologic:  No cranial nerve deficits noted. Station and gait are normal. Plantar reflexes are down-going bilaterally. DTRs are symmetrical throughout. Sensory, motor and coordinative functions appear intact. Skin:  R buttock w/a bruise and 3 healing blisters Psych:  Cognition and judgment appear intact. Alert and cooperative with normal attention span and concentration. No apparent delusions, illusions, hallucinations   Impression & Recommendations:  Problem # 1:  KNEE PAIN, RIGHT, ACUTE (ICD-719.46) Assessment Improved F/u w/Dr Charlett Blake is pending  Her updated medication list for this problem includes:    Aspirin 81 Mg Tbec (Aspirin) ..... One by mouth every day    Nabumetone 750 Mg Tabs (Nabumetone) .Marland Kitchen... 1 by mouth once daily  Problem # 2:  LOW BACK PAIN, CHRONIC (ICD-724.2) Assessment: Unchanged  Her updated medication list for this problem includes:    Aspirin 81 Mg Tbec (Aspirin) ..... One by mouth every day    Nabumetone 750 Mg Tabs (Nabumetone) .Marland Kitchen... 1 by mouth once daily  Orders: TLB-BMP (Basic Metabolic Panel-BMET) (80048-METABOL) TLB-Hepatic/Liver Function Pnl (80076-HEPATIC) TLB-Udip ONLY (81003-UDIP) TLB-A1C / Hgb A1C (Glycohemoglobin) (83036-A1C)  Problem # 3:  HYPERTENSION (ICD-401.9) Assessment: Unchanged  Her updated medication list for this problem includes:    Hyzaar 100-12.5 Mg Tabs (Losartan potassium-hctz) .Marland Kitchen... 1 by mouth daily    Amlodipine Besylate 5 Mg Tabs (Amlodipine besylate) .Marland Kitchen... 1 by mouth every day  Problem # 4:   DIABETES MELLITUS, TYPE II (ICD-250.00) Assessment: Unchanged  Her updated medication list for this problem includes:    Glipizide-metformin Hcl 2.5-500 Mg Tabs (Glipizide-metformin hcl) .Marland Kitchen... 1 by mouth two times a day    Hyzaar 100-12.5 Mg Tabs (Losartan potassium-hctz) .Marland Kitchen... 1 by mouth daily    Aspirin 81 Mg Tbec (Aspirin) ..... One by mouth every day  Orders: TLB-BMP (Basic Metabolic Panel-BMET) (80048-METABOL) TLB-Hepatic/Liver Function Pnl (80076-HEPATIC) TLB-Udip ONLY (81003-UDIP) TLB-A1C / Hgb A1C (Glycohemoglobin) (83036-A1C)  Complete Medication List: 1)  Klor-con M20 20 Meq Tbcr (Potassium chloride crys cr) .... Two times a day 2)  Fexofenadine Hcl 180 Mg Tabs (Fexofenadine hcl) .... Once daily 3)  Nasonex 50 Mcg/act Susp (Mometasone furoate) .Marland Kitchen.. 1 spray each nostril once daily 4)  Advair Diskus 100-50 Mcg/dose Misc (Fluticasone-salmeterol) .... One puff two times a day for asthma 5)  Epipen 0.3 Mg/0.28ml (1:1000) Devi (Epinephrine hcl (anaphylaxis)) .... As needed 6)  Anusol-hc 25 Mg Supp (Hydrocortisone acetate) .... Insert 1 suppository into rectum once a day 7)  Glipizide-metformin Hcl 2.5-500 Mg Tabs (Glipizide-metformin hcl) .Marland Kitchen.. 1 by mouth two times  a day 8)  Prevacid 30 Mg Cpdr (Lansoprazole) .... One by mouth daily 9)  Hyzaar 100-12.5 Mg Tabs (Losartan potassium-hctz) .Marland Kitchen.. 1 by mouth daily 10)  Aspirin 81 Mg Tbec (Aspirin) .... One by mouth every day 11)  Fluoxetine Hcl 10 Mg Tabs (Fluoxetine hcl) .Marland Kitchen.. 1 by mouth qd 12)  Amlodipine Besylate 5 Mg Tabs (Amlodipine besylate) .Marland Kitchen.. 1 by mouth every day 13)  Vitamin D3 1000 Unit Tabs (Cholecalciferol) .Marland Kitchen.. 1 by mouth daily 14)  Promethazine-dm 6.25-15 Mg/59ml Syrp (Promethazine-dm) .... 5-10 ml by mouth qid as needed for cough 15)  Proair Hfa 108 (90 Base) Mcg/act Aers (Albuterol sulfate) .Marland Kitchen.. 1-2 puffs qid as needed for wheezing 16)  Nabumetone 750 Mg Tabs (Nabumetone) .Marland Kitchen.. 1 by mouth once daily  Patient  Instructions: 1)  Please schedule a follow-up appointment in 3 months well w/labs. 2)  250.00 3)  HbgA1C prior to visit, ICD-9:  Contraindications/Deferment of Procedures/Staging:    Test/Procedure: FLU VAX    Reason for deferment: patient declined    Orders Added: 1)  Est. Patient Level IV [16109] 2)  TLB-BMP (Basic Metabolic Panel-BMET) [80048-METABOL] 3)  TLB-Hepatic/Liver Function Pnl [80076-HEPATIC] 4)  TLB-Udip ONLY [81003-UDIP] 5)  TLB-A1C / Hgb A1C (Glycohemoglobin) [83036-A1C]

## 2010-11-19 NOTE — Progress Notes (Signed)
  Phone Note Refill Request Message from:  Fax from Pharmacy on Mar 13, 2010 2:20 PM  Refills Requested: Medication #1:  CIPROFLOXACIN HCL 250 MG TABS 1 by mouth two times a day for cystitis. MD printed instead of sending electronically.  Initial call taken by: Ami Bullins CMA,  Mar 13, 2010 2:20 PM    Prescriptions: CIPROFLOXACIN HCL 250 MG TABS (CIPROFLOXACIN HCL) 1 by mouth two times a day for cystitis  #10 x 0   Entered by:   Ami Bullins CMA   Authorized by:   Tresa Garter MD   Signed by:   Bill Salinas CMA on 03/13/2010   Method used:   Electronically to        [facility]ail)       [address]       [place], [place]  [address]       Ph: [phone]       Fax: [phone]   RxID:   [id number]

## 2010-11-19 NOTE — Assessment & Plan Note (Signed)
Summary: DR AVP PT/NO CLINIC-CHEST CONGESTION-HOARSENESS-COUGH-LOT PHL...   Vital Signs:  Patient profile:   65 year old female Height:      64 inches Weight:      194 pounds BMI:     33.42 O2 Sat:      97 % on Room air Temp:     98.3 degrees F oral Pulse rate:   58 / minute Pulse rhythm:   regular Resp:     18 per minute BP sitting:   140 / 70  (left arm) Cuff size:   large  Vitals Entered By: Rock Nephew CMA (April 16, 2010 8:56 AM)  Nutrition Counseling: Patient's BMI is greater than 25 and therefore counseled on weight management options.  O2 Flow:  Room air CC: chest congestion, cough x 04/12/2010, URI symptoms   Primary Care Provider:  Dr. Posey Rea  CC:  chest congestion, cough x 04/12/2010, and URI symptoms.  History of Present Illness:  URI Symptoms      This is a 65 year old woman who presents with URI symptoms.  The symptoms began 5 days ago.  The severity is described as moderate.  The patient reports nasal congestion, purulent nasal discharge, and productive cough, but denies clear nasal discharge, sore throat, dry cough, earache, and sick contacts.  Associated symptoms include wheezing.  The patient denies fever, stiff neck, dyspnea, rash, vomiting, diarrhea, use of an antipyretic, and response to antipyretic.  The patient denies sneezing, headache, muscle aches, and severe fatigue.  Risk factors for Strep sinusitis include unilateral facial pain, unilateral nasal discharge, and double sickening.  The patient denies the following risk factors for Strep sinusitis: Strep exposure, tender adenopathy, and absence of cough.    Preventive Screening-Counseling & Management  Alcohol-Tobacco     Alcohol drinks/day: 0     Smoking Status: never  Hep-HIV-STD-Contraception     Hepatitis Risk: no risk noted     HIV Risk: no risk noted     STD Risk: no risk noted      Sexual History:  currently monogamous.        Drug Use:  never.        Blood Transfusions:  no.     Clinical Review Panels:  Diabetes Management   HgBA1C:  7.2 (03/12/2010)   Creatinine:  0.8 (03/12/2010)   Last Foot Exam:  yes (04/16/2010)  CBC   WBC:  9.0 (03/12/2010)   RBC:  4.35 (03/12/2010)   Hgb:  12.5 (03/12/2010)   Hct:  37.8 (03/12/2010)   Platelets:  265.0 (03/12/2010)   MCV  86.8 (03/12/2010)   MCHC  33.1 (03/12/2010)   RDW  14.3 (03/12/2010)   PMN:  65.3 (03/12/2010)   Lymphs:  23.5 (03/12/2010)   Monos:  5.5 (03/12/2010)   Eosinophils:  5.3 (03/12/2010)   Basophil:  0.4 (03/12/2010)  Complete Metabolic Panel   Glucose:  67 (03/12/2010)   Sodium:  144 (03/12/2010)   Potassium:  3.6 (03/12/2010)   Chloride:  106 (03/12/2010)   CO2:  30 (03/12/2010)   BUN:  8 (03/12/2010)   Creatinine:  0.8 (03/12/2010)   Albumin:  4.2 (03/12/2010)   Total Protein:  6.9 (03/12/2010)   Calcium:  9.3 (03/12/2010)   Total Bili:  0.7 (03/12/2010)   Alk Phos:  87 (03/12/2010)   SGPT (ALT):  23 (03/12/2010)   SGOT (AST):  25 (03/12/2010)   Medications Prior to Update: 1)  Klor-Con M20 20 Meq  Tbcr (Potassium Chloride Crys Cr) .... Two  Times A Day 2)  Fexofenadine Hcl 180 Mg  Tabs (Fexofenadine Hcl) .... Once Daily 3)  Nasonex 50 Mcg/act  Susp (Mometasone Furoate) .Marland Kitchen.. 1 Spray Each Nostril Once Daily 4)  Advair Diskus 100-50 Mcg/dose  Misc (Fluticasone-Salmeterol) .... One Puff Two Times A Day For Asthma 5)  Epipen 0.3 Mg/0.55ml (1:1000)  Devi (Epinephrine Hcl (Anaphylaxis)) .... As Needed 6)  Anusol-Hc 25 Mg Supp (Hydrocortisone Acetate) .... Insert 1 Suppository Into Rectum Once A Day 7)  Glipizide-Metformin Hcl 2.5-500 Mg  Tabs (Glipizide-Metformin Hcl) .Marland Kitchen.. 1 By Mouth Two Times A Day 8)  Prevacid 30 Mg Cpdr (Lansoprazole) .... One By Mouth Daily 9)  Hyzaar 100-12.5 Mg  Tabs (Losartan Potassium-Hctz) .Marland Kitchen.. 1 By Mouth Daily 10)  Aspirin 81 Mg  Tbec (Aspirin) .... One By Mouth Every Day 11)  Fluoxetine Hcl 10 Mg Tabs (Fluoxetine Hcl) .Marland Kitchen.. 1 By Mouth Qd 12)  Amlodipine  Besylate 5 Mg  Tabs (Amlodipine Besylate) .Marland Kitchen.. 1 By Mouth Every Day 13)  Vitamin D3 1000 Unit  Tabs (Cholecalciferol) .Marland Kitchen.. 1 By Mouth Daily  Current Medications (verified): 1)  Klor-Con M20 20 Meq  Tbcr (Potassium Chloride Crys Cr) .... Two Times A Day 2)  Fexofenadine Hcl 180 Mg  Tabs (Fexofenadine Hcl) .... Once Daily 3)  Nasonex 50 Mcg/act  Susp (Mometasone Furoate) .Marland Kitchen.. 1 Spray Each Nostril Once Daily 4)  Advair Diskus 100-50 Mcg/dose  Misc (Fluticasone-Salmeterol) .... One Puff Two Times A Day For Asthma 5)  Epipen 0.3 Mg/0.15ml (1:1000)  Devi (Epinephrine Hcl (Anaphylaxis)) .... As Needed 6)  Anusol-Hc 25 Mg Supp (Hydrocortisone Acetate) .... Insert 1 Suppository Into Rectum Once A Day 7)  Glipizide-Metformin Hcl 2.5-500 Mg  Tabs (Glipizide-Metformin Hcl) .Marland Kitchen.. 1 By Mouth Two Times A Day 8)  Prevacid 30 Mg Cpdr (Lansoprazole) .... One By Mouth Daily 9)  Hyzaar 100-12.5 Mg  Tabs (Losartan Potassium-Hctz) .Marland Kitchen.. 1 By Mouth Daily 10)  Aspirin 81 Mg  Tbec (Aspirin) .... One By Mouth Every Day 11)  Fluoxetine Hcl 10 Mg Tabs (Fluoxetine Hcl) .Marland Kitchen.. 1 By Mouth Qd 12)  Amlodipine Besylate 5 Mg  Tabs (Amlodipine Besylate) .Marland Kitchen.. 1 By Mouth Every Day 13)  Vitamin D3 1000 Unit  Tabs (Cholecalciferol) .Marland Kitchen.. 1 By Mouth Daily 14)  Zithromax Tri-Pak 500 Mg Tab (Azithromycin) .... Take As Directed One By Mouth Once Daily For 3 Days 15)  Promethazine-Dm 6.25-15 Mg/28ml Syrp (Promethazine-Dm) .... 5-10 Ml By Mouth Qid As Needed For Cough 16)  Proair Hfa 108 (90 Base) Mcg/act Aers (Albuterol Sulfate) .Marland Kitchen.. 1-2 Puffs Qid As Needed For Wheezing  Allergies: 1)  ! Dynacirc Cr (Isradipine) 2)  ! Maxzide 3)  ! Lovastatin (Lovastatin) 4)  ! Vytorin (Ezetimibe-Simvastatin) 5)  ! * Fish Oil 6)  ! Sulfadiazine (Sulfadiazine) 7)  ! Codeine 8)  ! Codeine 9)  Clonidine Hcl (Clonidine Hcl) 10)  * Dynacire 11)  Metformin Hcl (Metformin Hcl) 12)  * Maxide 13)  Sulfamethoxazole (Sulfamethoxazole)  Past History:  Past  Medical History: Last updated: 01/21/2010 Diabetes mellitus, type II Hypertension IBS  Dr Juanda Chance Eczema Food allergies Hemorhoids Hyperlipidemia Asthma Allergic rhinitis  MD rooster - allg - [facility]  Past Surgical History: Last updated: 11/04/2007 Hemorrhoidectomy 2007  Family History: Last updated: 02/16/2008 Mother - allergies  Social History: Last updated: 07/03/2009 Retired, workig part time [employer] Divorced Never Smoked  Risk Factors: Alcohol Use: 0 (04/16/2010)  Risk Factors: Smoking Status: never (04/16/2010)  Family History: Reviewed history from 02/16/2008 and no changes required.  Mother - allergies  Social History: Reviewed history from 07/03/2009 and no changes required. Retired, Materials engineer part Clinical cytogeneticist Divorced Never Smoked Hepatitis Risk:  no risk noted HIV Risk:  no risk noted STD Risk:  no risk noted Sexual History:  currently monogamous Drug Use:  never Blood Transfusions:  no  Review of Systems       The patient complains of hoarseness.  The patient denies anorexia, fever, decreased hearing, chest pain, syncope, dyspnea on exertion, peripheral edema, headaches, hemoptysis, abdominal pain, hematuria, suspicious skin lesions, enlarged lymph nodes, and angioedema.    Physical Exam  General:  alert, well-developed, well-nourished, well-hydrated, appropriate dress, normal appearance, healthy-appearing, cooperative to examination, and good hygiene.   Head:  normocephalic, atraumatic, no abnormalities observed, and no abnormalities palpated.   Eyes:  vision grossly intact and no injection.   Ears:  R ear normal and L ear normal.   Nose:  no external deformity, no airflow obstruction, no intranasal foreign body, no nasal polyps, no nasal mucosal lesions, no mucosal friability, no active bleeding or clots, no sinus percussion tenderness, no septum abnormalities, and nasal dischargemucosal pallor.   Mouth:  Oral mucosa and oropharynx  without lesions or exudates.  Teeth in good repair. Neck:  supple, full ROM, no masses, no thyromegaly, no thyroid nodules or tenderness, no JVD, normal carotid upstroke, no carotid bruits, no cervical lymphadenopathy, and no neck tenderness.   Lungs:  normal respiratory effort, no intercostal retractions, no accessory muscle use, normal breath sounds, no dullness, no fremitus, no crackles, and no wheezes.   Heart:  normal rate, regular rhythm, no murmur, no gallop, no rub, and no JVD.   Abdomen:  soft, non-tender, normal bowel sounds, no distention, no masses, no guarding, no rigidity, no rebound tenderness, no hepatomegaly, and no splenomegaly.   Msk:  No deformity or scoliosis noted of thoracic or lumbar spine.   Pulses:  R and L carotid,radial,femoral,dorsalis pedis and posterior tibial pulses are full and equal bilaterally Extremities:  No clubbing, cyanosis, edema, or deformity noted with normal full range of motion of all joints.   Neurologic:  No cranial nerve deficits noted. Station and gait are normal. Plantar reflexes are down-going bilaterally. DTRs are symmetrical throughout. Sensory, motor and coordinative functions appear intact. Skin:  Intact without suspicious lesions or rashes Cervical Nodes:  no anterior cervical adenopathy and no posterior cervical adenopathy.   Axillary Nodes:  no R axillary adenopathy and no L axillary adenopathy.   Psych:  Cognition and judgment appear intact. Alert and cooperative with normal attention span and concentration. No apparent delusions, illusions, hallucinations  Diabetes Management Exam:    Foot Exam (with socks and/or shoes not present):       Sensory-Pinprick/Light touch:          Left medial foot (L-4): normal          Left dorsal foot (L-5): normal          Left lateral foot (S-1): normal          Right medial foot (L-4): normal          Right dorsal foot (L-5): normal          Right lateral foot (S-1): normal        Sensory-Monofilament:          Left foot: normal          Right foot: normal       Inspection:  Left foot: normal          Right foot: normal       Nails:          Left foot: normal          Right foot: normal   Impression & Recommendations:  Problem # 1:  ASTHMA (ICD-493.90) Assessment Deteriorated  try depo-medrol IM and offer Proair inhaler as needed  Her updated medication list for this problem includes:    Advair Diskus 100-50 Mcg/dose Misc (Fluticasone-salmeterol) ..... One puff two times a day for asthma    Proair Hfa 108 (90 Base) Mcg/act Aers (Albuterol sulfate) .Marland Kitchen... 1-2 puffs qid as needed for wheezing  Pulmonary Functions Reviewed: O2 sat: 97 (04/16/2010)  Problem # 2:  BRONCHITIS, ACUTE (ICD-466.0) Assessment: Deteriorated  Her updated medication list for this problem includes:    Advair Diskus 100-50 Mcg/dose Misc (Fluticasone-salmeterol) ..... One puff two times a day for asthma    Zithromax Tri-pak 500 Mg Tab (Azithromycin) .Marland Kitchen... Take as directed one by mouth once daily for 3 days    Promethazine-dm 6.25-15 Mg/92ml Syrp (Promethazine-dm) .Marland Kitchen... 5-10 ml by mouth qid as needed for cough    Proair Hfa 108 (90 Base) Mcg/act Aers (Albuterol sulfate) .Marland Kitchen... 1-2 puffs qid as needed for wheezing  Take antibiotics and other medications as directed. Encouraged to push clear liquids, get enough rest, and take acetaminophen as needed. To be seen in 5-7 days if no improvement, sooner if worse.  Problem # 3:  COUGH (ICD-786.2) Assessment: New will look for pna, mass, edema, etc Orders: T-2 View CXR ([id number])  Complete Medication List: 1)  Klor-con M20 20 Meq Tbcr (Potassium chloride crys cr) .... Two times a day 2)  Fexofenadine Hcl 180 Mg Tabs (Fexofenadine hcl) .... Once daily 3)  Nasonex 50 Mcg/act Susp (Mometasone furoate) .Marland Kitchen.. 1 spray each nostril once daily 4)  Advair Diskus 100-50 Mcg/dose Misc (Fluticasone-salmeterol) .... One puff two times a day for  asthma 5)  Epipen 0.3 Mg/0.48ml (1:1000) Devi (Epinephrine hcl (anaphylaxis)) .... As needed 6)  Anusol-hc 25 Mg Supp (Hydrocortisone acetate) .... Insert 1 suppository into rectum once a day 7)  Glipizide-metformin Hcl 2.5-500 Mg Tabs (Glipizide-metformin hcl) .Marland Kitchen.. 1 by mouth two times a day 8)  Prevacid 30 Mg Cpdr (Lansoprazole) .... One by mouth daily 9)  Hyzaar 100-12.5 Mg Tabs (Losartan potassium-hctz) .Marland Kitchen.. 1 by mouth daily 10)  Aspirin 81 Mg Tbec (Aspirin) .... One by mouth every day 11)  Fluoxetine Hcl 10 Mg Tabs (Fluoxetine hcl) .Marland Kitchen.. 1 by mouth qd 12)  Amlodipine Besylate 5 Mg Tabs (Amlodipine besylate) .Marland Kitchen.. 1 by mouth every day 13)  Vitamin D3 1000 Unit Tabs (Cholecalciferol) .Marland Kitchen.. 1 by mouth daily 14)  Zithromax Tri-pak 500 Mg Tab (Azithromycin) .... Take as directed one by mouth once daily for 3 days 15)  Promethazine-dm 6.25-15 Mg/38ml Syrp (Promethazine-dm) .... 5-10 ml by mouth qid as needed for cough 16)  Proair Hfa 108 (90 Base) Mcg/act Aers (Albuterol sulfate) .Marland Kitchen.. 1-2 puffs qid as needed for wheezing  Patient Instructions: 1)  Please schedule a follow-up appointment in 2 weeks. 2)  Take your antibiotic as prescribed until ALL of it is gone, but stop if you develop a rash or swelling and contact our office as soon as possible. 3)  Acute bronchitis symptoms for less than 10 days are not helped by antibiotics. take over the counter cough medications. call if no improvment in  5-7 days, sooner if increasing cough, fever,  or new symptoms( shortness of breath, chest pain). Prescriptions: PROAIR HFA 108 (90 BASE) MCG/ACT AERS (ALBUTEROL SULFATE) 1-2 puffs QID as needed for wheezing  #1 x 11   Entered and Authorized by:   Etta Grandchild MD   Signed by:   Etta Grandchild MD on 04/16/2010   Method used:   Electronically to        [facility] (retail)       [address]       [place], [place]  [address]       Ph: [phone]       Fax: [phone]    RxID:   [id number] PROMETHAZINE-DM 6.25-15 MG/5ML SYRP (PROMETHAZINE-DM) 5-10 ml by mouth QID as needed for cough  #6 ounces x 1   Entered and Authorized by:   Etta Grandchild MD   Signed by:   Etta Grandchild MD on 04/16/2010   Method used:   Electronically to        [facility] (retail)       [address]       [place], [place]  [address]       Ph: [phone]       Fax: [phone]   RxID:   [id number] ZITHROMAX TRI-PAK 500 MG TAB (AZITHROMYCIN) Take as directed one by mouth once daily for 3 days  #3 x 0   Entered and Authorized by:   Etta Grandchild MD   Signed by:   Etta Grandchild MD on 04/16/2010   Method used:   Electronically to        [facility] (retail)       [address]       [place], [place]  [address]       Ph: [phone]       Fax: [phone]   RxID:   [id number]

## 2010-11-20 ENCOUNTER — Encounter: Payer: Self-pay | Admitting: Internal Medicine
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
## 2010-11-22 ENCOUNTER — Encounter: Payer: Self-pay | Admitting: Internal Medicine

## 2010-11-22 ENCOUNTER — Ambulatory Visit (INDEPENDENT_AMBULATORY_CARE_PROVIDER_SITE_OTHER): Admitting: Internal Medicine

## 2010-11-22 DIAGNOSIS — S060X9A Concussion with loss of consciousness of unspecified duration, initial encounter: Secondary | ICD-10-CM

## 2010-11-22 DIAGNOSIS — M542 Cervicalgia: Secondary | ICD-10-CM

## 2010-11-22 DIAGNOSIS — M545 Low back pain: Secondary | ICD-10-CM

## 2010-11-27 NOTE — Letter (Signed)
Summary: Out of Work  [facility]  [address] [place], [place] [address]   Phone: [phone]  Fax: [phone]    November 22, 2010   Employee:  Pamela Morgan    To Whom It May Concern:   [name] can go back to work on 11/25/10.  If you need additional information, please feel free to contact our office.         Sincerely,    Tresa Garter MD

## 2010-11-27 NOTE — Letter (Signed)
Summary: Out of Work  [facility]  [address] [place], [place] [address]   Phone: [phone]  Fax: [phone]    November 20, 2010   Employee:  Pamela Morgan [place]    To Whom It May Concern:   For medical reasons the above named patient must remain out of work until she is re-evaluated on Friday February 3rd.  If you need additional information, please feel free to contact our office.         Sincerely,    Lanier Prude, [facility]) for A. Jibril Mcminn, MD

## 2010-11-27 NOTE — Assessment & Plan Note (Signed)
Summary: 1 wk f/u per dr plot/cd   Vital Signs:  Patient profile:   65 year old female Height:      64 inches Weight:      185 pounds BMI:     31.87 Temp:     98.6 degrees F oral Pulse rate:   84 / minute Pulse rhythm:   regular Resp:     16 per minute BP sitting:   138 / 70  (left arm) Cuff size:   regular  Vitals Entered By: Lanier Prude, CMA(AAMA) (November 22, 2010 10:26 AM) CC: f/u  Is Patient Diabetic? Yes   Primary Care Provider:  Dr. Posey Rea  CC:  f/u .  History of Present Illness: The patient presents for a follow up of concussion, LBP, neck pain. She is better.  Current Medications (verified): 1)  Amlodipine Besylate 5 Mg  Tabs (Amlodipine Besylate) .Marland Kitchen.. 1 By Mouth Every Day 2)  Hyzaar 100-12.5 Mg  Tabs (Losartan Potassium-Hctz) .Marland Kitchen.. 1 By Mouth Daily 3)  Klor-Con M20 20 Meq  Tbcr (Potassium Chloride Crys Cr) .... Two Times A Day 4)  Fexofenadine Hcl 180 Mg  Tabs (Fexofenadine Hcl) .... Once Daily 5)  Nasonex 50 Mcg/act  Susp (Mometasone Furoate) .Marland Kitchen.. 1 Spray Each Nostril Once Daily 6)  Advair Diskus 100-50 Mcg/dose  Misc (Fluticasone-Salmeterol) .... One Puff Two Times A Day For Asthma 7)  Epipen 0.3 Mg/0.59ml (1:1000)  Devi (Epinephrine Hcl (Anaphylaxis)) .... As Needed 8)  Anusol-Hc 25 Mg Supp (Hydrocortisone Acetate) .... Insert 1 Suppository Into Rectum Once A Day 9)  Glipizide-Metformin Hcl 2.5-500 Mg  Tabs (Glipizide-Metformin Hcl) .Marland Kitchen.. 1 By Mouth Two Times A Day 10)  Prevacid 30 Mg Cpdr (Lansoprazole) .... One By Mouth Daily 11)  Aspirin 81 Mg  Tbec (Aspirin) .... One By Mouth Every Day 12)  Fluoxetine Hcl 10 Mg Tabs (Fluoxetine Hcl) .Marland Kitchen.. 1 By Mouth Qd 13)  Vitamin D3 1000 Unit  Tabs (Cholecalciferol) .Marland Kitchen.. 1 By Mouth Daily 14)  Proair Hfa 108 (90 Base) Mcg/act Aers (Albuterol Sulfate) .Marland Kitchen.. 1-2 Puffs Qid As Needed For Wheezing 15)  Nabumetone 750 Mg Tabs (Nabumetone) .Marland Kitchen.. 1 By Mouth Once Daily Pc  Pc X 1-2 Wks Then Prn 16)  Tramadol Hcl 50 Mg Tabs  (Tramadol Hcl) .Marland Kitchen.. 1-2 Tabs By Mouth Two Times A Day As Needed Pain  Allergies (verified): 1)  ! Dynacirc Cr (Isradipine) 2)  ! Maxzide 3)  ! Lovastatin (Lovastatin) 4)  ! Vytorin (Ezetimibe-Simvastatin) 5)  ! * Fish Oil 6)  ! Sulfadiazine (Sulfadiazine) 7)  Clonidine Hcl (Clonidine Hcl) 8)  * Dynacire 9)  * Maxide 10)  Sulfamethoxazole (Sulfamethoxazole) 11)  Metformin Hcl (Metformin Hcl) 12)  Codeine  Past History:  Past Medical History: Last updated: 07/09/2010 Diabetes mellitus, type II Hypertension IBS  Dr Juanda Chance Eczema Food allergies Hemorhoids Hyperlipidemia Asthma Allergic rhinitis Statin intolerant  MD roster - allg - [facility] -[facility]  Review of Systems  The patient denies fever, chest pain, syncope, and abdominal pain.    Physical Exam  General:  alert, well-developed, well-hydrated, appropriate dress, normal appearance, healthy-appearing, cooperative to examination, and good hygiene.  overweight-appearing.   Neck:  Cervical spine is less  tender to palpation over paraspinal muscles and with the ROM  Lungs:  normal respiratory effort, no intercostal retractions or use of accessory muscles; normal breath sounds bilaterally - no crackles and no wheezes.    Heart:  normal rate, regular rhythm, no murmur, no gallop, no rub, and no  JVD.   Msk:  Cervical spine is less  tender to palpation over paraspinal muscles and with the ROM  Lumbar-sacral spine is less  tender to palpation over paraspinal muscles and painfull with the ROM   Neurologic:  No cranial nerve deficits noted. Station and gait are normal. Plantar reflexes are down-going bilaterally. DTRs are symmetrical throughout. Sensory, motor and coordinative functions appear intact. Psych:  Cognition and judgment appear intact. Alert and cooperative with normal attention span and concentration. No apparent delusions, illusions, hallucinations   Impression & Recommendations:  Problem # 1:  CONCUSSION  (ICD-850.9) Assessment Improved  Problem # 2:  NECK PAIN (ICD-723.1) Assessment: Improved  Her updated medication list for this problem includes:    Aspirin 81 Mg Tbec (Aspirin) ..... One by mouth every day    Nabumetone 750 Mg Tabs (Nabumetone) .Marland Kitchen... 1 by mouth once daily pc  pc x 1-2 wks then prn    Tramadol Hcl 50 Mg Tabs (Tramadol hcl) .Marland Kitchen... 1-2 tabs by mouth two times a day as needed pain  Problem # 3:  LOW BACK PAIN, ACUTE (ICD-724.2) Assessment: Improved  Her updated medication list for this problem includes:    Aspirin 81 Mg Tbec (Aspirin) ..... One by mouth every day    Nabumetone 750 Mg Tabs (Nabumetone) .Marland Kitchen... 1 by mouth once daily pc  pc x 1-2 wks then prn    Tramadol Hcl 50 Mg Tabs (Tramadol hcl) .Marland Kitchen... 1-2 tabs by mouth two times a day as needed pain  Complete Medication List: 1)  Amlodipine Besylate 5 Mg Tabs (Amlodipine besylate) .Marland Kitchen.. 1 by mouth every day 2)  Hyzaar 100-12.5 Mg Tabs (Losartan potassium-hctz) .Marland Kitchen.. 1 by mouth daily 3)  Klor-con M20 20 Meq Tbcr (Potassium chloride crys cr) .... Two times a day 4)  Fexofenadine Hcl 180 Mg Tabs (Fexofenadine hcl) .... Once daily 5)  Nasonex 50 Mcg/act Susp (Mometasone furoate) .Marland Kitchen.. 1 spray each nostril once daily 6)  Advair Diskus 100-50 Mcg/dose Misc (Fluticasone-salmeterol) .... One puff two times a day for asthma 7)  Epipen 0.3 Mg/0.26ml (1:1000) Devi (Epinephrine hcl (anaphylaxis)) .... As needed 8)  Anusol-hc 25 Mg Supp (Hydrocortisone acetate) .... Insert 1 suppository into rectum once a day 9)  Glipizide-metformin Hcl 2.5-500 Mg Tabs (Glipizide-metformin hcl) .Marland Kitchen.. 1 by mouth two times a day 10)  Prevacid 30 Mg Cpdr (Lansoprazole) .... One by mouth daily 11)  Aspirin 81 Mg Tbec (Aspirin) .... One by mouth every day 12)  Fluoxetine Hcl 10 Mg Tabs (Fluoxetine hcl) .Marland Kitchen.. 1 by mouth qd 13)  Vitamin D3 1000 Unit Tabs (Cholecalciferol) .Marland Kitchen.. 1 by mouth daily 14)  Proair Hfa 108 (90 Base) Mcg/act Aers (Albuterol sulfate) .Marland Kitchen..  1-2 puffs qid as needed for wheezing 15)  Nabumetone 750 Mg Tabs (Nabumetone) .Marland Kitchen.. 1 by mouth once daily pc  pc x 1-2 wks then prn 16)  Tramadol Hcl 50 Mg Tabs (Tramadol hcl) .Marland Kitchen.. 1-2 tabs by mouth two times a day as needed pain  Patient Instructions: 1)  OK to go back to work on Monday 2)  Please schedule a follow-up appointment in 3 months. 3)  BMP prior to visit, ICD-9: 4)  HbgA1C prior to visit, ICD-9:250.00   Orders Added: 1)  Est. Patient Level III [[address]]

## 2010-12-09 ENCOUNTER — Telehealth: Payer: Self-pay | Admitting: Internal Medicine

## 2010-12-17 NOTE — Progress Notes (Signed)
Summary: Rf Cipro  Phone Note From Pharmacy   Summary of Call: rec Rf req for Cipro 250mg  1 by mouth two times a day for cystitis.  #10 ok to Rf?? Initial call taken by: Lanier Prude, [facility]),  December 09, 2010 9:24 AM  Follow-up for Phone Call        ok Thank you!  Follow-up by: Tresa Garter MD,  December 09, 2010 1:47 PM    New/Updated Medications: CIPRO 250 MG TABS (CIPROFLOXACIN HCL) take 1 by mouth two times a day Prescriptions: CIPRO 250 MG TABS (CIPROFLOXACIN HCL) take 1 by mouth two times a day  #10 x 0   Entered by:   Lanier Prude, CMA(AAMA)   Authorized by:   Tresa Garter MD   Signed by:   Lanier Prude, [facility]) on 12/10/2010   Method used:   Electronically to        [facility] (retail)       [address]       [place], [place]  [address]       Ph: [phone]       Fax: [phone]   RxID:   [id number]

## 2010-12-25 ENCOUNTER — Other Ambulatory Visit: Payer: Self-pay | Admitting: Endocrinology

## 2010-12-25 ENCOUNTER — Ambulatory Visit (INDEPENDENT_AMBULATORY_CARE_PROVIDER_SITE_OTHER): Admitting: Endocrinology

## 2010-12-25 ENCOUNTER — Ambulatory Visit (INDEPENDENT_AMBULATORY_CARE_PROVIDER_SITE_OTHER)
Admission: RE | Admit: 2010-12-25 | Discharge: 2010-12-25 | Disposition: A | Discharge status: Home / Self Care | Source: Ambulatory Visit | Attending: Endocrinology | Admitting: Endocrinology

## 2010-12-25 ENCOUNTER — Encounter: Payer: Self-pay | Admitting: Endocrinology

## 2010-12-25 DIAGNOSIS — R05 Cough: Secondary | ICD-10-CM

## 2010-12-25 DIAGNOSIS — E119 Type 2 diabetes mellitus without complications: Secondary | ICD-10-CM

## 2010-12-26 NOTE — Letter (Signed)
Summary: Off Duty form/[employer]  Off Duty form/[employer]   Imported By: Lester [place] 12/17/2010 10:18:18  _____________________________________________________________________  External Attachment:    Type:   Image     Comment:   External Document

## 2010-12-31 NOTE — Assessment & Plan Note (Signed)
Summary: HOARSENESS-COUGH-WAKE UP IN AM W/NOSE BLEED-DR AVP PT-ER/URG ...   Vital Signs:  Patient profile:   65 year old female Height:      64 inches (162.56 cm) Weight:      187.25 pounds (85.11 kg) BMI:     32.26 O2 Sat:      96 % on Room air Temp:     99.3 degrees F (37.39 degrees C) oral Pulse rate:   55 / minute Pulse rhythm:   regular BP sitting:   124 / 66  (left arm) Cuff size:   regular  Vitals Entered By: Brenton Grills CMA (AAMA) (December 25, 2010 11:00 AM)  O2 Flow:  Room air CC: Pt c/o cough, hoarness, SOB x 3 days/aj Is Patient Diabetic? Yes   Referring Provider:  Dr. Jacinta Shoe Primary Provider:  Dr. Posey Rea  CC:  Pt c/o cough, hoarness, and SOB x 3 days/aj.  History of Present Illness: pt has 3 days of prod-quality cough in the chest, and assoc wheezing.  she also has nasal congestion.   no cbg record, but states cbg's are well-controlled.  Current Medications (verified): 1)  Amlodipine Besylate 5 Mg  Tabs (Amlodipine Besylate) .Marland Kitchen.. 1 By Mouth Every Day 2)  Hyzaar 100-12.5 Mg  Tabs (Losartan Potassium-Hctz) .Marland Kitchen.. 1 By Mouth Daily 3)  Klor-Con M20 20 Meq  Tbcr (Potassium Chloride Crys Cr) .... Two Times A Day 4)  Fexofenadine Hcl 180 Mg  Tabs (Fexofenadine Hcl) .... Once Daily 5)  Nasonex 50 Mcg/act  Susp (Mometasone Furoate) .Marland Kitchen.. 1 Spray Each Nostril Once Daily 6)  Advair Diskus 100-50 Mcg/dose  Misc (Fluticasone-Salmeterol) .... One Puff Two Times A Day For Asthma 7)  Epipen 0.3 Mg/0.58ml (1:1000)  Devi (Epinephrine Hcl (Anaphylaxis)) .... As Needed 8)  Anusol-Hc 25 Mg Supp (Hydrocortisone Acetate) .... Insert 1 Suppository Into Rectum Once A Day 9)  Glipizide-Metformin Hcl 2.5-500 Mg  Tabs (Glipizide-Metformin Hcl) .Marland Kitchen.. 1 By Mouth Two Times A Day 10)  Prevacid 30 Mg Cpdr (Lansoprazole) .... One By Mouth Daily 11)  Aspirin 81 Mg  Tbec (Aspirin) .... One By Mouth Every Day 12)  Fluoxetine Hcl 10 Mg Tabs (Fluoxetine Hcl) .Marland Kitchen.. 1 By Mouth Qd 13)   Vitamin D3 1000 Unit  Tabs (Cholecalciferol) .Marland Kitchen.. 1 By Mouth Daily 14)  Proair Hfa 108 (90 Base) Mcg/act Aers (Albuterol Sulfate) .Marland Kitchen.. 1-2 Puffs Qid As Needed For Wheezing 15)  Nabumetone 750 Mg Tabs (Nabumetone) .Marland Kitchen.. 1 By Mouth Once Daily Pc  Pc X 1-2 Wks Then Prn 16)  Tramadol Hcl 50 Mg Tabs (Tramadol Hcl) .Marland Kitchen.. 1-2 Tabs By Mouth Two Times A Day As Needed Pain 17)  Cipro 250 Mg Tabs (Ciprofloxacin Hcl) .... Take 1 By Mouth Two Times A Day  Allergies (verified): 1)  ! Dynacirc Cr (Isradipine) 2)  ! Maxzide 3)  ! Lovastatin (Lovastatin) 4)  ! Vytorin (Ezetimibe-Simvastatin) 5)  ! * Fish Oil 6)  ! Sulfadiazine (Sulfadiazine) 7)  Clonidine Hcl (Clonidine Hcl) 8)  * Dynacire 9)  * Maxide 10)  Sulfamethoxazole (Sulfamethoxazole) 11)  Metformin Hcl (Metformin Hcl) 12)  Codeine  Past History:  Past Medical History: Last updated: 07/09/2010 Diabetes mellitus, type II Hypertension IBS  Dr Juanda Chance Eczema Food allergies Hemorhoids Hyperlipidemia Asthma Allergic rhinitis Statin intolerant  MD roster - allg - [facility] -[facility]  Review of Systems  The patient denies fever.         she has lost a few lbs, due to her efforts.  Physical  Exam  General:  normal appearance.   Head:  head: no deformity eyes: no periorbital swelling, no proptosis external nose and ears are normal mouth: no lesion seen Ears:  both tm's are red Lungs:  cta, except for a few exp wheezes.   Additional Exam:  CHEST - 2 VIEW Borderline cardiomegaly without acute disease.   Impression & Recommendations:  Problem # 1:  DIABETES MELLITUS, TYPE II (ICD-250.00) apparently well-controlled  Problem # 2:  uri new  Problem # 3:  mild asthma due to #2  Medications Added to Medication List This Visit: 1)  Cefuroxime Axetil 250 Mg Tabs (Cefuroxime axetil) .Marland Kitchen.. 1 tab two times a day 2)  Medrol (pak) 4 Mg Tabs (Methylprednisolone) .... As dir  Other Orders: T-2 View CXR (71020TC) Est. Patient Level IV  ([address])  Patient Instructions: 1)  a chest x-ray is being ordered for you today.  please call [phone] to hear your test result. 2)  take "dosepack." 3)  take cefuroxime 250 mg two times a day 4)  continue your inhalers. 5)  call if blood sugar goes above 200, or if you feel worse in general.  Prescriptions: MEDROL (PAK) 4 MG TABS (METHYLPREDNISOLONE) as dir  #1 pack x 0   Entered and Authorized by:   Minus Breeding MD   Signed by:   Minus Breeding MD on 12/25/2010   Method used:   Electronically to        [facility] (retail)       [address]       [place], [place]  [address]       Ph: [phone]       Fax: [phone]   RxID:   [id number] CEFUROXIME AXETIL 250 MG TABS (CEFUROXIME AXETIL) 1 tab two times a day  #14 x 0   Entered and Authorized by:   Minus Breeding MD   Signed by:   Minus Breeding MD on 12/25/2010   Method used:   Electronically to        [facility] (retail)       [address]       [place], [place]  [address]       Ph: [phone]       Fax: [phone]   RxID:   [id number]    Orders Added: 1)  T-2 View CXR [71020TC] 2)  Est. Patient Level IV [01093]

## 2011-01-22 ENCOUNTER — Other Ambulatory Visit: Payer: Self-pay | Admitting: Internal Medicine

## 2011-01-22 DIAGNOSIS — Z1231 Encounter for screening mammogram for malignant neoplasm of breast: Secondary | ICD-10-CM

## 2011-01-23 ENCOUNTER — Ambulatory Visit (HOSPITAL_COMMUNITY)
Admission: RE | Admit: 2011-01-23 | Discharge: 2011-01-23 | Disposition: A | Discharge status: Home / Self Care | Source: Ambulatory Visit | Attending: Internal Medicine | Admitting: Internal Medicine

## 2011-01-23 DIAGNOSIS — Z1231 Encounter for screening mammogram for malignant neoplasm of breast: Secondary | ICD-10-CM | POA: Insufficient documentation

## 2011-02-19 ENCOUNTER — Other Ambulatory Visit

## 2011-02-19 ENCOUNTER — Other Ambulatory Visit: Payer: Self-pay | Admitting: Internal Medicine

## 2011-02-19 DIAGNOSIS — E119 Type 2 diabetes mellitus without complications: Secondary | ICD-10-CM

## 2011-02-21 ENCOUNTER — Ambulatory Visit: Admitting: Internal Medicine

## 2011-02-26 ENCOUNTER — Encounter: Payer: Self-pay | Admitting: Internal Medicine

## 2011-02-27 ENCOUNTER — Ambulatory Visit (INDEPENDENT_AMBULATORY_CARE_PROVIDER_SITE_OTHER): Admitting: Internal Medicine

## 2011-02-27 ENCOUNTER — Encounter: Payer: Self-pay | Admitting: Internal Medicine

## 2011-02-27 DIAGNOSIS — J45909 Unspecified asthma, uncomplicated: Secondary | ICD-10-CM

## 2011-02-27 DIAGNOSIS — J209 Acute bronchitis, unspecified: Secondary | ICD-10-CM

## 2011-02-27 DIAGNOSIS — E119 Type 2 diabetes mellitus without complications: Secondary | ICD-10-CM

## 2011-02-27 MED ORDER — AMLODIPINE BESYLATE 5 MG PO TABS: 5.0000 mg | ORAL_TABLET | Freq: Every day | ORAL | Status: DC

## 2011-02-27 MED ORDER — LOSARTAN POTASSIUM-HCTZ 100-12.5 MG PO TABS: 1.0000 | ORAL_TABLET | Freq: Every day | ORAL | Status: DC

## 2011-02-27 MED ORDER — HYDROCORTISONE ACETATE 25 MG RE SUPP: 25.0000 mg | Freq: Every day | RECTAL | Status: DC

## 2011-02-27 MED ORDER — GLIPIZIDE-METFORMIN HCL 2.5-500 MG PO TABS: 1.0000 | ORAL_TABLET | Freq: Two times a day (BID) | ORAL | Status: DC

## 2011-02-27 MED ORDER — ALBUTEROL SULFATE HFA 108 (90 BASE) MCG/ACT IN AERS: 2.0000 | INHALATION_SPRAY | Freq: Four times a day (QID) | RESPIRATORY_TRACT | Status: DC | PRN

## 2011-02-27 MED ORDER — AZITHROMYCIN 250 MG PO TABS: ORAL_TABLET | ORAL | Status: AC

## 2011-02-27 MED ORDER — TRAMADOL HCL 50 MG PO TABS: 50.0000 mg | ORAL_TABLET | Freq: Two times a day (BID) | ORAL | Status: DC | PRN

## 2011-02-27 MED ORDER — POTASSIUM CHLORIDE CRYS ER 20 MEQ PO TBCR: 20.0000 meq | EXTENDED_RELEASE_TABLET | Freq: Two times a day (BID) | ORAL | Status: DC

## 2011-02-27 MED ORDER — FEXOFENADINE HCL 180 MG PO TABS: 180.0000 mg | ORAL_TABLET | Freq: Every day | ORAL | Status: DC

## 2011-02-27 MED ORDER — FLUTICASONE-SALMETEROL 100-50 MCG/DOSE IN AEPB: 1.0000 | INHALATION_SPRAY | Freq: Two times a day (BID) | RESPIRATORY_TRACT | Status: DC

## 2011-02-27 MED ORDER — FLUOXETINE HCL 10 MG PO TABS: 10.0000 mg | ORAL_TABLET | Freq: Every day | ORAL | Status: DC

## 2011-02-27 MED ORDER — MOMETASONE FUROATE 50 MCG/ACT NA SUSP: 1.0000 | Freq: Every day | NASAL | Status: DC

## 2011-02-27 NOTE — Progress Notes (Signed)
  Subjective:    [name], female    DOB: [birth year], 65 y.o.   MRN: [id number]  HPI  The patient presents for a follow-up of  chronic hypertension, chronic dyslipidemia, type 2 diabetes controlled with medicines  HPI  C/o URI sx's x 10-14  days. C/o ST, cough, weakness. Not better with OTC medicines. Actually, the patient is getting worse. The patient did not sleep last night due to cough.  Review of Systems  Constitutional: Positive for fever, chills and fatigue.  HENT: Positive for congestion, rhinorrhea, sneezing and postnasal drip.   Eyes: Positive for photophobia and pain. Negative for discharge and visual disturbance.  Respiratory: Positive for cough and wheezing.   Positive for chest pain.  Gastrointestinal: Negative for vomiting, abdominal pain, diarrhea and abdominal distention.  Genitourinary: Negative for dysuria and difficulty urinating.  Skin: Negative for rash.  Neurological: Positive for dizziness, weakness and light-headedness.       Review of Systems     Objective:   Physical Exam  Constitutional: She appears well-developed and well-nourished. No distress.  HENT:  Head: Normocephalic.  Right Ear: External ear normal.  Left Ear: External ear normal.  Nose: Nose normal.  Mouth/Throat: Oropharynx is clear and moist.  Eyes: Conjunctivae are normal. Pupils are equal, round, and reactive to light. Right eye exhibits no discharge. Left eye exhibits no discharge.  Neck: Normal range of motion. Neck supple. No JVD present. No tracheal deviation present. No thyromegaly present.  Cardiovascular: Normal rate, regular rhythm and normal heart sounds.   Pulmonary/Chest: No stridor. No respiratory distress. She has wheezes (B sides).  Abdominal: Soft. Bowel sounds are normal. She exhibits no distension and no mass. There is no tenderness. There is no rebound and no guarding.  Musculoskeletal: She exhibits no edema and no tenderness.  Lymphadenopathy:   She has no cervical adenopathy.  Neurological: She displays normal reflexes. No cranial nerve deficit. She exhibits normal muscle tone. Coordination normal.  Skin: No rash noted. No erythema.  Psychiatric: She has a normal mood and affect. Her behavior is normal. Judgment and thought content normal.          Assessment & Plan:  BRONCHITIS, ACUTE Will start a Zpac  DIABETES MELLITUS, TYPE II Labs pending  ASTHMA Worse due to bronchitis

## 2011-02-27 NOTE — Assessment & Plan Note (Signed)
Worse due to bronchitis

## 2011-02-27 NOTE — Assessment & Plan Note (Signed)
Labs pending.  

## 2011-02-27 NOTE — Assessment & Plan Note (Signed)
Will start a Zpac

## 2011-03-04 ENCOUNTER — Other Ambulatory Visit: Payer: Self-pay | Admitting: *Deleted

## 2011-03-04 MED ORDER — LOSARTAN POTASSIUM-HCTZ 100-12.5 MG PO TABS: 1.0000 | ORAL_TABLET | Freq: Every day | ORAL | Status: DC

## 2011-03-04 NOTE — Progress Notes (Signed)
Needs Rx of hyzaar to go to [facility] - done

## 2011-03-04 NOTE — Op Note (Signed)
[name], Pamela Morgan                 ACCOUNT NO.:  [id number]   MEDICAL RECORD NO.:  [id number]          PATIENT TYPE:  AMB   LOCATION:  DSC                          FACILITY:  [facility]   PHYSICIAN:  Thomas A. Cornett, M.D.DATE OF BIRTH:  1946/10/18   DATE OF PROCEDURE:  08/16/2007  DATE OF DISCHARGE:                               OPERATIVE REPORT   PREOPERATIVE DIAGNOSES:  1. Chronic posterior midline anal fissure.  2. Grade 3 internal/external hemorrhoids.   PROCEDURES:  1. Closed lateral internal sphincterotomy.  2. Three-column internal/external hemorrhoidectomy.   SURGEON:  Maisie Fus A. Cornett, MD   ANESTHESIA:  General endotracheal anesthesia with 0.25% Sensorcaine  local for perianal block.   ESTIMATED BLOOD LOSS:  50 mL.   SPECIMEN:  Hemorrhoidal tissue to pathology.   DRAINS:  None.   INDICATIONS FOR PROCEDURE:  The patient is a 65 year old female with a  longstanding history of chronic anal fissures.  They heal with  conservative measures but recur and, unfortunately, do not go away with  diet modification.  She also has significant internal and external  hemorrhoid disease.  After examining her I felt that a lateral internal  sphincterotomy would be her best at this point since she has failed  numerous attempts at conservative management of her anal fissures,  including diet modification.  Also, hemorrhoidectomy was indicated due  to her grade 3 prolapsed hemorrhoids.  She presents today for the above.   DESCRIPTION OF PROCEDURE:  After the patient was placed under general  anesthesia, she was placed prone jackknife.  The buttocks were taped  apart.  After sterile prep of the anal canal, digital examination  revealed a hypertrophied internal sphincter.  There were chronic  inflammatory changes of the posterior midline of the anal canal.  Using  an 11 blade, I palpated the internal and external sphincters in the  intersphincteric groove.  I then slid the 11 blade in  between the two  sphincters and cut the internal sphincter with my finger on this.  This  released quite nicely.  Next, she had considerable grade 3 prolapsed  hemorrhoids.  She had three columns of disease involving the right  anterior, posterior column and left lateral.  All three of these were  excised in a similar fashion using curved Mayos.  A stitch was placed  the apex of the hemorrhoid and the mucosa was oversewn with excellent  hemostasis.  At this point a plug of Gelfoam and Surgicel was constructed and placed  in the anal canal for packing.  All final counts of sponge, needle and  instruments were found be correct for this portion of the case.  The  patient was then placed supine, extubated and taken to recovery in  satisfactory condition.      Thomas A. Cornett, M.D.  Electronically Signed     TAC/MEDQ  D:  08/16/2007  T:  08/17/2007  Job:  [id number]   cc:   Hedwig Morton. Juanda Chance, MD

## 2011-03-04 NOTE — Assessment & Plan Note (Signed)
[facility]                         GASTROENTEROLOGY OFFICE NOTE   NAME:DAVISQuintana, Canelo                        MRN:          [id number]  DATE:03/03/2007                            DOB:          August 23, 1946    PROGRESS NOTE GI CONSULTATION   Ms. Roberson is a 65 year old, African-American female patient of Dr. Victorino Dike, who has had rectal bleeding.  She had several colonoscopies  previously, the last one in December 2003, with findings of internal as  well as external hemorrhoids.  She has recently had exacerbation of her  bleeding.  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  She has to sit in the back of the bus, which is quite uncomfortable, and  it makes her hemorrhoids to flare up.  She has the rectal pain as well  as bright red blood per rectum on the toilet tissue as well as in the  commode.  Her bowel movements seem to be somewhat constipated.  There is  no family history of colon cancer.  Her mother had a history of  hemorrhoids and for constipation, too.   MEDICATIONS:  1. Potassium supplement.  2. Fexofenadine 180 mg p.o. daily.  3. Amlodipine 5 mg p.o. daily.  4. Metformin 500 mg two p.o. daily.  5. Multivitamins.  6. Lasix 40 mg p.o. daily.  7. Vytorin 10 mg p.o. daily.  8. Nasonex.  9. Hydrocortisone suppositories for hemorrhoids.  10.Prilosec 20 mg p.r.n.   PHYSICAL EXAMINATION:  VITAL SIGNS:  Blood pressure 150/82, pulse 60,  weight 192 pounds.  LUNGS:  Clear to auscultation.  COR:  Normal S1 and normal S2.  ABDOMEN:  Soft and nontender with normoactive bowel sounds, minimal  tenderness in the epigastrium.  RECTAL AND ANOSCOPIC EXAM:  Small skin tags at the rectal os.  There  were at least three second-degree hemorrhoids, which were rather  edematous and cyanotic but did not bleed actively.  Stool was hemoccult  negative.  There was no definite prolapse, although patient has  complained of prolapsing tissue  that she has to push back.   IMPRESSION:  A 65 year old, African-American female with symptomatic  hemorrhoids noted on previous colonoscopy five years ago.  She now seems  to have a prolapse and failure with suppositories.   PLAN:  1. I have discussed with the patient medical treatment versus surgical      treatment, and she would prefer to use the suppositories again in      attempt to control the discomfort.  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  2. High fiber diet, booklet on hemorrhoids.  3. A recall colonoscopy scheduled for December 2008.  She will let us      know if her bleeding continues and we would arrange for her to have      a hemorrhoidal banding or possibly a PPH, a surgical procedure by a      surgeon.  Hedwig Morton. Juanda Chance, MD     DMB/MedQ  DD: 03/03/2007  DT: 03/03/2007  Job #: [id number]   cc:   Georgina Quint. Plotnikov, MD

## 2011-03-04 NOTE — Assessment & Plan Note (Signed)
[facility]                         GASTROENTEROLOGY OFFICE NOTE   NAME:Pamela Morgan, Pamela Morgan                        MRN:          [id number]  DATE:06/17/2007                            DOB:          1946/05/17    Pamela Morgan is a 65 year old [place] American female, former patient of  Dr. Victorino Dike who has symptomatic hemorrhoids and anal fissure.  We  saw her on Mar 03, 2007 and anoscopy exam confirmed second grade  hemorrhoids with edematous mucosa but no active bleeding.  Her history  of hemorrhoids dates back to 2003 when she saw Dr. Kendrick Ranch and failed  medical therapy but it was Dr. Earlene Plater' opinion that as long as she was  constipated it was going to create problems.  Patient has continued to  be constipated.  She takes MiraLax, stool softeners.  Her stools are  improved but at times has to strain.  I have discussed possible  hemorrhoidectomy with the patient and during her last visit she has been  on intensive medical treatment of the suppositories and the Analpram  cream.  She comes today with not much improvement.   PHYSICAL EXAMINATION:  Blood pressure 126/72, pulse 54 and weight 189  pounds.  ABDOMINAL:  Essentially unremarkable with minimal tenderness in  epigastrium.  Anoscopic exam reveals rather tender anal canal.  This time I do see  bleeding anal fissure which starts in the rectal ampulla as a spot of  internal hemorrhoid, there is 2 other internal hemorrhoids which were  quite hyperemic and hemorrhagic, stool was Hemoccult positive.  Patient  was quite uncomfortable during the exam.   IMPRESSION:  A 65 year old [place] American female with symptomatic  hemorrhoids and anal fissure.  This is a chronic, recurrent problem.  She also has some associated rectal spasm.  Her constipation is not  severe.  I think she needs a surgical intervention on her hemorrhoids.   PLAN:  1. Surgical referral.  2. Continue conservative measures with  hydrocortisone suppositories,      Analpram cream, Darvocet N-100 for rectal pain.  I am giving her      medical leave for 4 weeks; agreeable prior to that do colonoscopy      since her last one was 5 years ago to make sure that the rectal      bleeding is not originating higher up.  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  She is supposed to go back to work shortly      but I have given her medical leave for next 4 weeks until we can      straighten out the rectal condition.     Hedwig Morton. Juanda Chance, MD  Electronically Signed    DMB/MedQ  DD: 06/17/2007  DT: 06/18/2007  Job #: [id number]   cc:   Georgina Quint. Plotnikov, MD

## 2011-03-07 NOTE — H&P (Signed)
NAMEFINOLA, ROSAL NO.:  [id number]   MEDICAL RECORD NO.:  [id number]                   PATIENT TYPE:  INP   LOCATION:  0102                                 FACILITY:  [facility]   PHYSICIAN:  Carole Binning, M.D. [facility]         DATE OF BIRTH:  July 07, 1946   DATE OF ADMISSION:  01/02/2004  DATE OF DISCHARGE:                                HISTORY & PHYSICAL   CARDIOLOGIST:  Willa Rough, M.D.   PRIMARY CARE PHYSICIAN:  Georgina Quint. Plotnikov, M.D.   PRESENTING CIRCUMSTANCE:   I had a pain in my chest that went through to  the back.   HISTORY OF PRESENT ILLNESS:  Ms. Gudiel is a 65 year old female who felt  bloated and uneasy last evening.  She has a history of gastroesophageal  reflux and flatulence.  She went to work this morning, but coming back from  work at about 9:30 she felt tired.  She has been feeling more fatigued in  the last two weeks.  She laid down.  On arising at about noon she felt the  sudden onset of midsternal pain which passed through to the back.  She  describes it as a sort of a heavy type smothering pain accompanied by  shortness of breath, it lasted about 1-1/2 hours before easing off.  There  was no radiation, no accompanying nausea or vomiting, no diaphoresis.  Once  again, she had some increasing dyspnea.  The pain is not positional and it  does not change when she takes a deep breath.  Of note, the patient has  noted increased stress levels at work the last two weeks.  Upon arrival in  the emergency room, she was given four baby aspirin.  At the time of this  examination, she is chest pain free.   ALLERGIES:  1. SULFA.  2. CODEINE.  3. EATING ALMOST ANY KIND OF FISH, CAUSING TUNA FISH causes facial swelling     and breathing problems which exacerbate the patient's asthma.   MEDICATIONS:  1. Lovastatin 20 mg at bedtime.  2. Potassium chloride 20 mEq daily.  3. Norvasc 5 mg daily.  4. Hyzaar 100/25 daily.  5. Metformin  500 mg b.i.d.  6. Prevacid 30 mg daily.  7. Avandia 8 mg daily.  8. Allegra 180 mg daily.  9. Enteric coated aspirin 81 mg daily.  10.      Advair 250/50 one puff per day.   PAST MEDICAL HISTORY:  1. Cardiac risk factors of hypertension.  2. Type 2 diabetes x3 years.  3. Unknown lipid status, however, currently on Lovastatin.  4. History of asthma.  5. Gastroesophageal reflux disease.  6. Irritable bowel syndrome.  7. Anxiety and depression.   PAST SURGICAL HISTORY:  1. Status post hysterectomy.  2. Status post colonoscopy in April 2003, with finding of hemorrhoids.  3. Abdominal ultrasound in April 2003, with finding of non-alcoholic  fatty     liver infiltrates.   SOCIAL HISTORY:  The patient lives in [place] with her daughter.  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  She does not smoke and has never smoked.  She does not partake  of alcoholic beverages or recreational drugs.   FAMILY HISTORY:  Mother died of cancer.  Father's medical health is unknown.  She has two sisters alive and well.   REVIEW OF SYSTEMS:  GENERAL:  The patient is not experiencing fevers,  chills, night sweats, or weight change except mild weight gain.  She is not  complaining of any adenopathy.  HEENT:  The patient is not having epistaxis,  vertigo, photophobia, vision, or hearing loss.  She has no non-healing  lesions or rashes.  CARDIOVASCULAR:  The patient is complaining of chest  pain, a lone episode today at noon lasting 1-1/2 hours culminated by  shortness of breath.  She does have mild dyspnea on exertion.  She sleeps  with two pillows.  She has some trouble with edema, but no prior history of  palpitations, presyncope, or syncope.  No claudication, no coughing or  wheezing.  GENITOURINARY:  She does sometimes experience nocturia, but she  is not having hematuria or dysuria.  Lately, she has been fatigued for the  past two weeks.  It is  coincident with starting Lovastatin, but she has also  started a new job which she finds stressful.  She has a history of anxiety  and depression.  GASTROINTESTINAL:  She has bright red blood per rectum at  times from hemorrhoids confirmed on colonoscopy April 2003.  She has ongoing  gastroesophageal reflux disease for which she takes Prevacid.  ENDOCRINE:  She has no heat or cold intolerance.  She has no current polyuria or  polydipsia.  All other systems are negative.   PHYSICAL EXAMINATION:  VITAL SIGNS:  Temperature 98.1, pulse is 69 and  regular, respirations are 18 and even, blood pressure 152/76, oxygen  saturation 97% on room air.  GENERAL:  She is alert and oriented, composed, comfortable, well-nourished  without acute distress.  HEENT:  Eyes:  Pupils equal, round, reactive to light.  Extraocular  movements were intact.  Sclerae are clear.  Mucous membranes are pink and  moist without lesions or erythema.  The patient does not wear dentures.  NECK:  Supple.  No carotid bruits auscultated.  No cervical lymphadenopathy.  CARDIOVASCULAR:  The heart has a regular rate and rhythm with a normal S1  and S2.  No S3 or S4 auscultated.  There is no murmur.  LUNGS:  Clear to auscultation and percussion bilaterally.  SKIN:  No evidence of lesions or non-healing wounds.  BREASTS:  Deferred.  GENITOURINARY:  Deferred.  RECTAL:  Deferred.  ABDOMEN:  The patient is mildly obese, soft, nondistended, bowel sounds are  present.  There is no rebound or guarding.  Abdominal aorta is non-  pulsatile.  EXTREMITIES:  No evidence of clubbing or cyanosis.  There is a mild 1 to 2+  edema to the lower extremities.  MUSCULOSKELETAL:  No joint deformity, no effusions.  When questioned about  possible myopathy with regard to Lovastatin, she says she just feels  fatigued, but no particular muscle aching.  NEUROLOGIC:  No deficits noted.  Cranial nerves II-XII are grossly intact. Grip strength is 5/5  bilaterally.  Plantar and dorsiflexion well preserved  and 5/5.  Radial pulses  are 4/4 bilaterally.  She has 4/4 pulses in both  dorsalis pedis regions.   LABORATORY DATA:  Chest x-ray shows mild cardiomegaly, prominent pulmonary  vasculature, and bibasilar atelectasis.  Electrocardiogram:  The rate is 63,  normal sinus rhythm, PR interval of 136, QRS of 70, QTC is 458.  No ST-T  changes, no hypertrophy.  Complete blood count:  White cells 7.7, hemoglobin  11.6, hematocrit 35.9, platelets 259.  BMET has been ordered and by tomorrow  there will be a PT/PTT, as well as followup cardiac enzymes.  Her first  cardiac enzymes drawn at 1510 hours this afternoon, January 02, 2004, CK is  126, CK-MB is 0.8, troponin-I less than 0.01.   ASSESSMENT:  1. Admission with lone episode of chest pain, atypical for ischemia.  2. Type 2 diabetes diagnosed three years ago.  3. Hypertension.  4. Asthma.  5. Gastroesophageal reflux disease.  6. Irritable bowel syndrome.  7. Anxiety and depression.  8. Exercise Cardiolite in June 2004, ejection fraction 65%, no ischemia.  9. Status post hysterectomy.  10.      Abdominal ultrasound showing non-alcoholic fatty liver disease in     April 2003.  11.      Colonoscopy in April 2003, with hemorrhoids.   PLAN:  The plan for this patient has been formulated by Dr. Loraine Leriche Pulsipher  after interviewing and examining the patient.  He is in agreement that the  patient is experiencing atypical chest pain with non-diagnostic  electrocardiogram and negative troponin-I study.  The patient is currently  pain-free.  Will be admitted to [facility] to rule out myocardial  infarction with serial cardiac enzymes.  If these enzymes are negative, the  patient will be scheduled for an outpatient Cardiolite.  The patient will  also have a D-dimer study and fasting lipid profile on the morning of January 03, 2004.  She will also have a followup electrocardiogram on the morning  of  January 03, 2004.     Maple Mirza, P.A.                    Carole Binning, M.D. [facility]    GM/MEDQ  D:  01/02/2004  T:  01/02/2004  Job:  [id number]

## 2011-03-07 NOTE — Discharge Summary (Signed)
NAMEMARJI, KUEHNEL                             ACCOUNT NO.:  [id number]   MEDICAL RECORD NO.:  [id number]                   PATIENT TYPE:  INP   LOCATION:  9629                                 FACILITY:  [facility]   PHYSICIAN:  Carole Binning, M.D. [facility]         DATE OF BIRTH:  [birth year]   DATE OF ADMISSION:  01/02/2004  DATE OF DISCHARGE:  01/03/2004                           DISCHARGE SUMMARY - REFERRING   PROCEDURE:  None.   REASON FOR ADMISSION:  A 65 year old female, with no known history of  coronary artery disease, with multiple cardiac risk factors, presents to  [facility] Emergency Room with atypical chest pain.  Please refer to  dictated admission note for full details.   LABORATORY DATA:  Normal serial cardiac markers.  Hemoglobin 11.6,  hematocrit 35, WBC 7.7, platelets 259.  Normal complete metabolic profile.  D-dimer 0.27.   ADMISSION CXR:  NAD.   HOSPITAL COURSE:  The patient ruled out for myocardial infarction with all  serial cardiac markers within normal limits.  D-dimer was also negative.  A  chest x-ray showed no acute changes.   The patient's symptoms were felt to be atypical.  She had no further chest  pain the following morning.  The patient was cleared for discharge with  plans to proceed with early outpatient stress test as initially recommended.   The patient was discharged on previous home medication regimen.   INSTRUCTIONS:  1. The patient is scheduled for exercise stress Cardiolite on Friday, March     18th, at 12:30 p.m.  2. The patient is scheduled to follow up with her primary cardiologist,     Willa Rough, M.D., on March 31st at 11:15 a.m.   DISCHARGE DIAGNOSES:  1. Atypical chest pain.     a. Normal exercise Cardiolite June 2004.  2. Multiple cardiac risk factors.     a. Type 2 diabetes mellitus.     b. Hypertension.     c. Hypercholesterolemia.     d. Age.  3. Gastroesophageal reflux disease.  4. History of asthma.  5. Sinus  bradycardia.     Gene Serpe, P.A. [facility]                      Carole Binning, M.D. [facility]    GS/MEDQ  D:  01/03/2004  T:  01/04/2004  Job:  [id number]   cc:   Georgina Quint. Plotnikov, M.D. [facility]

## 2011-06-05 ENCOUNTER — Other Ambulatory Visit (INDEPENDENT_AMBULATORY_CARE_PROVIDER_SITE_OTHER)

## 2011-06-05 ENCOUNTER — Encounter: Payer: Self-pay | Admitting: Internal Medicine

## 2011-06-05 ENCOUNTER — Ambulatory Visit (INDEPENDENT_AMBULATORY_CARE_PROVIDER_SITE_OTHER): Admitting: Internal Medicine

## 2011-06-05 DIAGNOSIS — J45909 Unspecified asthma, uncomplicated: Secondary | ICD-10-CM

## 2011-06-05 DIAGNOSIS — J209 Acute bronchitis, unspecified: Secondary | ICD-10-CM

## 2011-06-05 DIAGNOSIS — L272 Dermatitis due to ingested food: Secondary | ICD-10-CM

## 2011-06-05 DIAGNOSIS — E119 Type 2 diabetes mellitus without complications: Secondary | ICD-10-CM

## 2011-06-05 DIAGNOSIS — F438 Other reactions to severe stress: Secondary | ICD-10-CM

## 2011-06-05 DIAGNOSIS — R209 Unspecified disturbances of skin sensation: Secondary | ICD-10-CM

## 2011-06-05 DIAGNOSIS — I10 Essential (primary) hypertension: Secondary | ICD-10-CM

## 2011-06-05 DIAGNOSIS — G8911 Acute pain due to trauma: Secondary | ICD-10-CM

## 2011-06-05 DIAGNOSIS — R1902 Left upper quadrant abdominal swelling, mass and lump: Secondary | ICD-10-CM

## 2011-06-05 DIAGNOSIS — R202 Paresthesia of skin: Secondary | ICD-10-CM

## 2011-06-05 DIAGNOSIS — M545 Low back pain: Secondary | ICD-10-CM

## 2011-06-05 DIAGNOSIS — R05 Cough: Secondary | ICD-10-CM

## 2011-06-05 LAB — COMPREHENSIVE METABOLIC PANEL
AST: 22 U/L (ref 0–37)
Albumin: 4.2 g/dL (ref 3.5–5.2)
Alkaline Phosphatase: 76 U/L (ref 39–117)
Chloride: 101 mEq/L (ref 96–112)
Glucose, Bld: 88 mg/dL (ref 70–99)
Potassium: 3.6 mEq/L (ref 3.5–5.1)
Sodium: 140 mEq/L (ref 135–145)
Total Protein: 7.1 g/dL (ref 6.0–8.3)

## 2011-06-05 MED ORDER — GLUCOSE BLOOD VI STRP: ORAL_STRIP | Status: DC

## 2011-06-05 NOTE — Progress Notes (Signed)
  Subjective:    [name], female    DOB: 1946/09/08, 65 y.o.   MRN: [id number]  HPI  F/u LBP C/o hernia The patient presents for a follow-up of  chronic hypertension, chronic dyslipidemia, type 2 diabetes controlled with medicines    Review of Systems  Constitutional: Negative for chills, activity change, appetite change, fatigue and unexpected weight change.  HENT: Negative for congestion, mouth sores and sinus pressure.   Eyes: Negative for visual disturbance.  Respiratory: Negative for cough and chest tightness.   Gastrointestinal: Positive for abdominal pain (? hernia). Negative for nausea.  Genitourinary: Negative for frequency, difficulty urinating and vaginal pain.  Musculoskeletal: Positive for back pain. Negative for gait problem.  Skin: Negative for pallor and rash.  Neurological: Negative for dizziness, tremors, weakness, numbness and headaches.  Psychiatric/Behavioral: Negative for confusion and sleep disturbance.       Objective:   Physical Exam  Constitutional: She appears well-developed and well-nourished. No distress.  HENT:  Head: Normocephalic.  Right Ear: External ear normal.  Left Ear: External ear normal.  Nose: Nose normal.  Mouth/Throat: Oropharynx is clear and moist.  Eyes: Conjunctivae are normal. Pupils are equal, round, and reactive to light. Right eye exhibits no discharge. Left eye exhibits no discharge.  Neck: Normal range of motion. Neck supple. No JVD present. No tracheal deviation present. No thyromegaly present.  Cardiovascular: Normal rate, regular rhythm and normal heart sounds.   Pulmonary/Chest: No stridor. No respiratory distress. She has no wheezes.  Abdominal: Soft. Bowel sounds are normal. She exhibits no distension and no mass. There is no tenderness. There is no rebound and no guarding.  Musculoskeletal: She exhibits no edema and no tenderness.  Lymphadenopathy:    She has no cervical adenopathy.  Neurological: She  displays normal reflexes. No cranial nerve deficit. She exhibits normal muscle tone. Coordination normal.  Skin: No rash noted. No erythema.  Psychiatric: She has a normal mood and affect. Her behavior is normal. Judgment and thought content normal.    Procedure Note :    Procedure :   Point of care (POC) sonography examination   Indication: LUQ swelling ? Hernia per pt   Equipment used: Sonosite M-Turbo with HFL38x/13-6 MHz transducer linear probe. The images were stored in the unit and later transferred in storage.  The patient was placed in a decubitus position.  This study revealed no abd wall hernia with different manuevres.   Impression: no abd wall hernia was found         Assessment & Plan:

## 2011-06-07 NOTE — Assessment & Plan Note (Addendum)
Controlled.  

## 2011-06-07 NOTE — Assessment & Plan Note (Addendum)
On Rx 

## 2011-06-07 NOTE — Assessment & Plan Note (Addendum)
Epipen Rx

## 2011-06-13 ENCOUNTER — Ambulatory Visit: Admitting: Internal Medicine

## 2011-06-13 ENCOUNTER — Other Ambulatory Visit: Payer: Self-pay | Admitting: Internal Medicine

## 2011-06-13 ENCOUNTER — Other Ambulatory Visit

## 2011-06-13 ENCOUNTER — Ambulatory Visit (INDEPENDENT_AMBULATORY_CARE_PROVIDER_SITE_OTHER): Admitting: Internal Medicine

## 2011-06-13 ENCOUNTER — Ambulatory Visit (INDEPENDENT_AMBULATORY_CARE_PROVIDER_SITE_OTHER)
Admission: RE | Admit: 2011-06-13 | Discharge: 2011-06-13 | Disposition: A | Discharge status: Home / Self Care | Source: Ambulatory Visit | Attending: Internal Medicine | Admitting: Internal Medicine

## 2011-06-13 ENCOUNTER — Encounter: Payer: Self-pay | Admitting: Internal Medicine

## 2011-06-13 DIAGNOSIS — F438 Other reactions to severe stress: Secondary | ICD-10-CM

## 2011-06-13 DIAGNOSIS — R202 Paresthesia of skin: Secondary | ICD-10-CM

## 2011-06-13 DIAGNOSIS — I10 Essential (primary) hypertension: Secondary | ICD-10-CM

## 2011-06-13 DIAGNOSIS — M545 Low back pain, unspecified: Secondary | ICD-10-CM

## 2011-06-13 DIAGNOSIS — G8911 Acute pain due to trauma: Secondary | ICD-10-CM

## 2011-06-13 DIAGNOSIS — R209 Unspecified disturbances of skin sensation: Secondary | ICD-10-CM

## 2011-06-13 DIAGNOSIS — R05 Cough: Secondary | ICD-10-CM

## 2011-06-13 LAB — URINALYSIS, ROUTINE W REFLEX MICROSCOPIC
Bilirubin Urine: NEGATIVE
Total Protein, Urine: NEGATIVE
Urine Glucose: NEGATIVE

## 2011-06-13 MED ORDER — TRAMADOL HCL 50 MG PO TABS: 50.0000 mg | ORAL_TABLET | Freq: Two times a day (BID) | ORAL | Status: DC | PRN
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
## 2011-06-14 ENCOUNTER — Encounter: Payer: Self-pay | Admitting: Internal Medicine

## 2011-06-14 NOTE — Assessment & Plan Note (Signed)
Cont Rx 

## 2011-06-14 NOTE — Assessment & Plan Note (Signed)
On Rx prn - see meds

## 2011-06-14 NOTE — Assessment & Plan Note (Signed)
Start PT Tramadol prn UA X ray LS spine

## 2011-06-14 NOTE — Assessment & Plan Note (Deleted)
Samples of Advair 250/50 bid x 2 wks

## 2011-06-18 ENCOUNTER — Telehealth: Payer: Self-pay | Admitting: *Deleted
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
## 2011-06-18 NOTE — Telephone Encounter (Signed)
OK. Thx

## 2011-06-19 NOTE — Telephone Encounter (Signed)
Letter faxed.

## 2011-06-25 ENCOUNTER — Ambulatory Visit: Attending: Internal Medicine | Admitting: Rehabilitative and Restorative Service Providers"

## 2011-06-25 DIAGNOSIS — IMO0001 Reserved for inherently not codable concepts without codable children: Secondary | ICD-10-CM | POA: Insufficient documentation

## 2011-06-25 DIAGNOSIS — M542 Cervicalgia: Secondary | ICD-10-CM | POA: Insufficient documentation

## 2011-06-25 DIAGNOSIS — M545 Low back pain, unspecified: Secondary | ICD-10-CM | POA: Insufficient documentation

## 2011-06-25 DIAGNOSIS — M2569 Stiffness of other specified joint, not elsewhere classified: Secondary | ICD-10-CM | POA: Insufficient documentation

## 2011-06-27 ENCOUNTER — Ambulatory Visit (INDEPENDENT_AMBULATORY_CARE_PROVIDER_SITE_OTHER): Admitting: Internal Medicine

## 2011-06-27 ENCOUNTER — Encounter: Payer: Self-pay | Admitting: Internal Medicine

## 2011-06-27 DIAGNOSIS — G8911 Acute pain due to trauma: Secondary | ICD-10-CM

## 2011-06-27 DIAGNOSIS — I10 Essential (primary) hypertension: Secondary | ICD-10-CM

## 2011-06-27 DIAGNOSIS — M545 Low back pain: Secondary | ICD-10-CM

## 2011-06-27 DIAGNOSIS — M542 Cervicalgia: Secondary | ICD-10-CM

## 2011-06-27 MED ORDER — MELOXICAM 7.5 MG PO TABS: 7.5000 mg | ORAL_TABLET | Freq: Every day | ORAL | Status: DC
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
## 2011-06-28 ENCOUNTER — Encounter: Payer: Self-pay | Admitting: Internal Medicine

## 2011-06-28 NOTE — Assessment & Plan Note (Signed)
She is relating high BP to pain: it is possible. Will watch.

## 2011-07-01 ENCOUNTER — Ambulatory Visit: Admitting: Physical Therapy

## 2011-07-02 ENCOUNTER — Ambulatory Visit: Admitting: Physical Therapy

## 2011-07-03 ENCOUNTER — Ambulatory Visit: Admitting: Physical Therapy

## 2011-07-07 ENCOUNTER — Ambulatory Visit: Admitting: Physical Therapy

## 2011-07-09 ENCOUNTER — Ambulatory Visit: Admitting: Rehabilitative and Restorative Service Providers"

## 2011-07-10 ENCOUNTER — Ambulatory Visit: Admitting: Rehabilitative and Restorative Service Providers"

## 2011-07-15 ENCOUNTER — Ambulatory Visit

## 2011-07-17 ENCOUNTER — Ambulatory Visit: Admitting: Rehabilitative and Restorative Service Providers"

## 2011-07-21 ENCOUNTER — Ambulatory Visit: Attending: Internal Medicine | Admitting: Physical Therapy

## 2011-07-21 DIAGNOSIS — M545 Low back pain, unspecified: Secondary | ICD-10-CM | POA: Insufficient documentation

## 2011-07-21 DIAGNOSIS — M2569 Stiffness of other specified joint, not elsewhere classified: Secondary | ICD-10-CM | POA: Insufficient documentation

## 2011-07-21 DIAGNOSIS — IMO0001 Reserved for inherently not codable concepts without codable children: Secondary | ICD-10-CM | POA: Insufficient documentation

## 2011-07-21 DIAGNOSIS — M542 Cervicalgia: Secondary | ICD-10-CM | POA: Insufficient documentation

## 2011-07-22 ENCOUNTER — Ambulatory Visit: Admitting: Physical Therapy

## 2011-07-22 LAB — POCT CARDIAC MARKERS: Troponin i, poc: <0.05

## 2011-07-22 LAB — DIFFERENTIAL
Basophils Relative: 0
Lymphocytes Relative: 13
Lymphs Abs: 1.3
Monocytes Relative: 6
Neutro Abs: 7.9 — ABNORMAL HIGH
Neutrophils Relative %: 78 — ABNORMAL HIGH

## 2011-07-22 LAB — POCT I-STAT, CHEM 8
Chloride: 103
Glucose, Bld: 86
HCT: 40
Potassium: 3.4 — ABNORMAL LOW

## 2011-07-22 LAB — CBC
RBC: 4.72
WBC: 10.2

## 2011-07-23 ENCOUNTER — Ambulatory Visit (INDEPENDENT_AMBULATORY_CARE_PROVIDER_SITE_OTHER): Admitting: Internal Medicine

## 2011-07-23 ENCOUNTER — Encounter: Payer: Self-pay | Admitting: Internal Medicine

## 2011-07-23 DIAGNOSIS — G8911 Acute pain due to trauma: Secondary | ICD-10-CM

## 2011-07-23 DIAGNOSIS — E119 Type 2 diabetes mellitus without complications: Secondary | ICD-10-CM

## 2011-07-23 DIAGNOSIS — M545 Low back pain, unspecified: Secondary | ICD-10-CM

## 2011-07-23 NOTE — Progress Notes (Signed)
  Subjective:    [name], female    DOB: 02/04/1946, 66 y.o.   MRN: [id number]  HPI  F/u LBP - 5-8/10 on the L (LS and L buttock), worse with walking. Better w/rest C/o mild L neck and shoulder pain  Review of Systems  Constitutional: Negative for chills, activity change, appetite change, fatigue and unexpected weight change.  HENT: Negative for congestion, mouth sores and sinus pressure.   Eyes: Negative for visual disturbance.  Respiratory: Negative for cough and chest tightness.   Gastrointestinal: Negative for nausea and abdominal pain.  Genitourinary: Negative for frequency, difficulty urinating and vaginal pain.  Musculoskeletal: Positive for back pain and gait problem.  Skin: Negative for pallor and rash.  Neurological: Negative for dizziness, tremors, weakness, numbness and headaches.  Psychiatric/Behavioral: Negative for confusion and sleep disturbance.       Objective:   Physical Exam  Constitutional: She appears well-developed and well-nourished. No distress.  HENT:  Head: Normocephalic.  Right Ear: External ear normal.  Left Ear: External ear normal.  Nose: Nose normal.  Mouth/Throat: Oropharynx is clear and moist.  Eyes: Conjunctivae are normal. Pupils are equal, round, and reactive to light. Right eye exhibits no discharge. Left eye exhibits no discharge.  Neck: Normal range of motion. Neck supple. No JVD present. No tracheal deviation present. No thyromegaly present.  Cardiovascular: Normal rate, regular rhythm and normal heart sounds.   Pulmonary/Chest: No stridor. No respiratory distress. She has no wheezes.  Abdominal: Soft. Bowel sounds are normal. She exhibits no distension and no mass. There is no tenderness. There is no rebound and no guarding.  Musculoskeletal: She exhibits tenderness (LS, L buttock is tender). She exhibits no edema.  Lymphadenopathy:    She has no cervical adenopathy.  Neurological: She displays normal reflexes. No  cranial nerve deficit. She exhibits normal muscle tone. Coordination normal.  Skin: No rash noted. No erythema.  Psychiatric: Her behavior is normal. Judgment and thought content normal.       subdued          Assessment & Plan:

## 2011-07-23 NOTE — Assessment & Plan Note (Signed)
Continue with current prescription therapy as reflected on the Med list.  

## 2011-07-23 NOTE — Assessment & Plan Note (Signed)
RTC 4 mo
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
## 2011-07-28 ENCOUNTER — Ambulatory Visit: Admitting: Physical Therapy

## 2011-07-30 ENCOUNTER — Encounter: Admitting: Physical Therapy

## 2011-07-30 LAB — CBC
HCT: 34.5 — ABNORMAL LOW
Hemoglobin: 11.3 — ABNORMAL LOW
MCV: 84
Platelets: 265
WBC: 7.9

## 2011-07-30 LAB — DIFFERENTIAL
Eosinophils Absolute: 0.5
Eosinophils Relative: 7 — ABNORMAL HIGH
Lymphs Abs: 1.7
Monocytes Absolute: 0.5

## 2011-07-30 LAB — BASIC METABOLIC PANEL
Calcium: 9.2
Chloride: 102
Creatinine, Ser: 0.91
GFR calc Af Amer: >60

## 2011-07-31 ENCOUNTER — Ambulatory Visit: Admitting: Physical Therapy

## 2011-08-04 ENCOUNTER — Ambulatory Visit: Admitting: Physical Therapy

## 2011-08-06 ENCOUNTER — Ambulatory Visit: Admitting: Rehabilitative and Restorative Service Providers"

## 2011-08-20 ENCOUNTER — Other Ambulatory Visit (HOSPITAL_COMMUNITY)
Admission: RE | Admit: 2011-08-20 | Discharge: 2011-08-20 | Disposition: A | Discharge status: Home / Self Care | Source: Ambulatory Visit | Attending: Women's Health | Admitting: Women's Health

## 2011-08-20 ENCOUNTER — Ambulatory Visit (INDEPENDENT_AMBULATORY_CARE_PROVIDER_SITE_OTHER): Admitting: Women's Health

## 2011-08-20 ENCOUNTER — Encounter: Payer: Self-pay | Admitting: Women's Health

## 2011-08-20 VITALS — BP 130/70 | Ht 63.75 in | Wt 181.0 lb

## 2011-08-20 DIAGNOSIS — Z01419 Encounter for gynecological examination (general) (routine) without abnormal findings: Secondary | ICD-10-CM | POA: Insufficient documentation
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
## 2011-08-22 ENCOUNTER — Encounter: Payer: Self-pay | Admitting: Internal Medicine

## 2011-08-22 ENCOUNTER — Ambulatory Visit (INDEPENDENT_AMBULATORY_CARE_PROVIDER_SITE_OTHER): Admitting: Internal Medicine

## 2011-08-22 VITALS — BP 140/88 | HR 64 | Temp 98.1°F | Resp 16 | Wt 179.0 lb

## 2011-08-22 DIAGNOSIS — M545 Low back pain, unspecified: Secondary | ICD-10-CM

## 2011-08-22 DIAGNOSIS — E785 Hyperlipidemia, unspecified: Secondary | ICD-10-CM

## 2011-08-22 DIAGNOSIS — E119 Type 2 diabetes mellitus without complications: Secondary | ICD-10-CM

## 2011-08-22 DIAGNOSIS — I10 Essential (primary) hypertension: Secondary | ICD-10-CM

## 2011-08-22 NOTE — Assessment & Plan Note (Signed)
Continue with current prescription therapy as reflected on the Med list.  

## 2011-08-22 NOTE — Assessment & Plan Note (Addendum)
Better with PT Ok to go back to work 11/12
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
## 2011-11-24 ENCOUNTER — Encounter: Payer: Self-pay | Admitting: Internal Medicine

## 2011-11-24 ENCOUNTER — Ambulatory Visit (INDEPENDENT_AMBULATORY_CARE_PROVIDER_SITE_OTHER): Admitting: Internal Medicine

## 2011-11-24 VITALS — BP 140/80 | HR 72 | Temp 98.6°F | Resp 16 | Wt 186.0 lb

## 2011-11-24 DIAGNOSIS — I10 Essential (primary) hypertension: Secondary | ICD-10-CM

## 2011-11-24 DIAGNOSIS — E119 Type 2 diabetes mellitus without complications: Secondary | ICD-10-CM

## 2011-11-24 DIAGNOSIS — M545 Low back pain, unspecified: Secondary | ICD-10-CM

## 2011-11-24 DIAGNOSIS — G8911 Acute pain due to trauma: Secondary | ICD-10-CM

## 2011-11-24 DIAGNOSIS — M542 Cervicalgia: Secondary | ICD-10-CM

## 2011-11-24 DIAGNOSIS — M25511 Pain in right shoulder: Secondary | ICD-10-CM | POA: Insufficient documentation

## 2011-11-24 DIAGNOSIS — M25519 Pain in unspecified shoulder: Secondary | ICD-10-CM

## 2011-11-24 DIAGNOSIS — Z23 Encounter for immunization: Secondary | ICD-10-CM

## 2011-11-24 MED ORDER — METHYLPREDNISOLONE ACETATE PF 40 MG/ML IJ SUSP: 10.0000 mg | Freq: Once | INTRAMUSCULAR | Status: DC

## 2011-11-24 MED ORDER — POTASSIUM CHLORIDE CRYS ER 20 MEQ PO TBCR: 20.0000 meq | EXTENDED_RELEASE_TABLET | Freq: Two times a day (BID) | ORAL | Status: DC

## 2011-11-24 MED ORDER — FLUTICASONE-SALMETEROL 100-50 MCG/DOSE IN AEPB: 1.0000 | INHALATION_SPRAY | Freq: Two times a day (BID) | RESPIRATORY_TRACT | Status: DC

## 2011-11-24 MED ORDER — MOMETASONE FUROATE 50 MCG/ACT NA SUSP: 1.0000 | Freq: Every day | NASAL | Status: DC

## 2011-11-24 MED ORDER — MELOXICAM 7.5 MG PO TABS: 7.5000 mg | ORAL_TABLET | Freq: Every day | ORAL | Status: DC

## 2011-11-24 MED ORDER — ALBUTEROL SULFATE HFA 108 (90 BASE) MCG/ACT IN AERS: 2.0000 | INHALATION_SPRAY | Freq: Four times a day (QID) | RESPIRATORY_TRACT | Status: DC | PRN

## 2011-11-24 MED ORDER — LOSARTAN POTASSIUM-HCTZ 100-12.5 MG PO TABS: 1.0000 | ORAL_TABLET | Freq: Every day | ORAL | Status: DC

## 2011-11-24 MED ORDER — AMLODIPINE BESYLATE 5 MG PO TABS: 5.0000 mg | ORAL_TABLET | Freq: Every day | ORAL | Status: DC

## 2011-11-24 MED ORDER — TRAMADOL HCL 50 MG PO TABS: 50.0000 mg | ORAL_TABLET | Freq: Two times a day (BID) | ORAL | Status: DC | PRN

## 2011-11-24 MED ORDER — FLUOXETINE HCL 10 MG PO TABS: 10.0000 mg | ORAL_TABLET | Freq: Every day | ORAL | Status: DC

## 2011-11-24 MED ORDER — GLIPIZIDE-METFORMIN HCL 2.5-500 MG PO TABS: 1.0000 | ORAL_TABLET | Freq: Two times a day (BID) | ORAL | Status: DC

## 2011-11-24 MED ORDER — METHYLPREDNISOLONE ACETATE PF 40 MG/ML IJ SUSP: 40.0000 mg | Freq: Once | INTRAMUSCULAR | Status: DC

## 2011-11-24 MED ORDER — FEXOFENADINE HCL 180 MG PO TABS: 180.0000 mg | ORAL_TABLET | Freq: Every day | ORAL | Status: DC

## 2011-11-24 NOTE — Patient Instructions (Signed)
Postprocedure instructions :    A Band-Aid should be left on for 12 hours. Injection therapy is not a cure itself. It is used in conjunction with other modalities. You can use nonsteroidal anti-inflammatories like ibuprofen , hot and cold compresses. Rest is recommended in the next 24 hours. You need to report immediately  if fever, chills or any signs of infection develop. 

## 2011-11-24 NOTE — Assessment & Plan Note (Signed)
Continue with current prescription therapy as reflected on the Med list.  
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
## 2011-11-24 NOTE — Assessment & Plan Note (Signed)
2/13 R - will inject
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
## 2011-11-24 NOTE — Assessment & Plan Note (Signed)
2/13 subacr bursitis Will inject
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
## 2012-01-20 ENCOUNTER — Encounter: Payer: Self-pay | Admitting: Internal Medicine

## 2012-01-20 ENCOUNTER — Ambulatory Visit (INDEPENDENT_AMBULATORY_CARE_PROVIDER_SITE_OTHER): Admitting: Internal Medicine

## 2012-01-20 VITALS — BP 160/98 | HR 68 | Temp 98.4°F | Resp 16 | Wt 187.0 lb

## 2012-01-20 DIAGNOSIS — J209 Acute bronchitis, unspecified: Secondary | ICD-10-CM

## 2012-01-20 DIAGNOSIS — M545 Low back pain, unspecified: Secondary | ICD-10-CM

## 2012-01-20 DIAGNOSIS — J45909 Unspecified asthma, uncomplicated: Secondary | ICD-10-CM

## 2012-01-20 DIAGNOSIS — I10 Essential (primary) hypertension: Secondary | ICD-10-CM

## 2012-01-20 MED ORDER — HYDROCOD POLST-CHLORPHEN POLST 10-8 MG/5ML PO LQCR: 5.0000 mL | Freq: Two times a day (BID) | ORAL | Status: DC | PRN

## 2012-01-20 MED ORDER — BENZONATATE 100 MG PO CAPS: 100.0000 mg | ORAL_CAPSULE | Freq: Three times a day (TID) | ORAL | Status: AC | PRN

## 2012-01-20 MED ORDER — METHYLPREDNISOLONE ACETATE 80 MG/ML IJ SUSP
120.0000 mg | Freq: Once | INTRAMUSCULAR | Status: AC
  Administered 2012-01-20: 120 mg via INTRAMUSCULAR

## 2012-01-20 MED ORDER — CEFUROXIME AXETIL 500 MG PO TABS: ORAL_TABLET | ORAL | Status: AC

## 2012-01-20 NOTE — Assessment & Plan Note (Signed)
Continue with current prescription therapy as reflected on the Med list.  

## 2012-01-20 NOTE — Progress Notes (Signed)
Addended by: Merrilyn Puma on: 01/20/2012 01:16 PM   Modules accepted: Orders

## 2012-01-20 NOTE — Assessment & Plan Note (Signed)
Ceftin x10 d 
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
## 2012-01-20 NOTE — Assessment & Plan Note (Signed)
Better  

## 2012-01-20 NOTE — Assessment & Plan Note (Signed)
Depo 120 mg im Cont Advair

## 2012-02-20 ENCOUNTER — Other Ambulatory Visit: Payer: Self-pay

## 2012-02-20 MED ORDER — AMLODIPINE BESYLATE 5 MG PO TABS: 5.0000 mg | ORAL_TABLET | Freq: Every day | ORAL | Status: DC

## 2012-02-20 MED ORDER — LOSARTAN POTASSIUM-HCTZ 100-12.5 MG PO TABS: 1.0000 | ORAL_TABLET | Freq: Every day | ORAL | Status: DC

## 2012-02-25 ENCOUNTER — Ambulatory Visit: Admitting: Internal Medicine

## 2012-05-04 ENCOUNTER — Ambulatory Visit: Admitting: Internal Medicine

## 2012-05-04 DIAGNOSIS — Z0289 Encounter for other administrative examinations: Secondary | ICD-10-CM

## 2012-05-10 ENCOUNTER — Other Ambulatory Visit: Payer: Self-pay | Admitting: Obstetrics and Gynecology

## 2012-05-10 DIAGNOSIS — Z1231 Encounter for screening mammogram for malignant neoplasm of breast: Secondary | ICD-10-CM

## 2012-05-17 ENCOUNTER — Encounter: Payer: Self-pay | Admitting: Internal Medicine

## 2012-05-17 ENCOUNTER — Ambulatory Visit (INDEPENDENT_AMBULATORY_CARE_PROVIDER_SITE_OTHER): Payer: Medicare Other | Admitting: Internal Medicine

## 2012-05-17 VITALS — BP 110/80 | HR 58 | Temp 98.7°F | Ht 63.75 in | Wt 191.0 lb

## 2012-05-17 DIAGNOSIS — I10 Essential (primary) hypertension: Secondary | ICD-10-CM

## 2012-05-17 DIAGNOSIS — J45909 Unspecified asthma, uncomplicated: Secondary | ICD-10-CM | POA: Diagnosis not present

## 2012-05-17 DIAGNOSIS — K625 Hemorrhage of anus and rectum: Secondary | ICD-10-CM | POA: Diagnosis not present

## 2012-05-17 DIAGNOSIS — R609 Edema, unspecified: Secondary | ICD-10-CM | POA: Diagnosis not present

## 2012-05-17 MED ORDER — FUROSEMIDE 20 MG PO TABS: 20.0000 mg | ORAL_TABLET | Freq: Every day | ORAL | Status: DC | PRN

## 2012-05-17 MED ORDER — BUDESONIDE-FORMOTEROL FUMARATE 160-4.5 MCG/ACT IN AERO: 2.0000 | INHALATION_SPRAY | Freq: Two times a day (BID) | RESPIRATORY_TRACT | Status: DC

## 2012-05-17 MED ORDER — HYDROCORTISONE ACETATE 25 MG RE SUPP: 25.0000 mg | Freq: Every day | RECTAL | Status: DC

## 2012-05-17 NOTE — Patient Instructions (Signed)
It was good to see you today. Reduce amlodipine by 1/2 dose - take 2.5 mg daily for blood pressure and to reduce ankle swelling Also use lasix each AM IF needed for swelling Stop advair and start symbicort for asthma (cough and shortness of breath) - Refill on suppository for bleeding hemorrhoids  Your prescription(s) have been submitted to your pharmacy. Please take as directed and contact our office if you believe you are having problem(s) with the medication(s). Please schedule followup in 2-4 weeks to continue review, call sooner if problems.

## 2012-05-17 NOTE — Assessment & Plan Note (Signed)
Sub op control with cough symptoms ?compliance - change advair to symicort and continue ProAir prn

## 2012-05-17 NOTE — Progress Notes (Signed)
  Subjective:    [name], female    DOB: October 17, 1946, 66 y.o.   MRN: [id number]  HPI  See CC and ROS  Hx hemorrhoids (int) in past with same bleeding - denies pain or prolapse - last colo 2008 reviewed Denies med changes or excess salt intake Inconsistent use of MDI/Advair but denies tobacco exposure or hx  Past Medical History  Diagnosis Date  . IBS (irritable bowel syndrome)     Dr Juanda Chance  . Eczema   . Hemorrhoids     colo 06/2007 - int hem  . Hyperlipidemia   . Asthma   . Allergy     food/ rhinitis  . Ovarian cyst   . Fibroid   . Hypertension   . Diabetes mellitus     type 2  . Hiatal hernia   . Heart murmur      Review of Systems  Constitutional: Positive for fatigue. Negative for fever, chills, activity change, appetite change and unexpected weight change.  HENT: Negative for ear pain, congestion, rhinorrhea, postnasal drip and sinus pressure.   Respiratory: Positive for cough, shortness of breath and wheezing. Negative for choking.   Cardiovascular: Negative for chest pain and palpitations.  Gastrointestinal: Negative for nausea, vomiting, abdominal pain, diarrhea, constipation and rectal pain.  Genitourinary: Negative for difficulty urinating.  Musculoskeletal: Negative for back pain and gait problem.  Skin: Negative for wound.  Neurological: Negative for dizziness and headaches.       Objective:   Physical Exam BP 110/80  Pulse 58  Temp 98.7 F (37.1 C) (Oral)  Ht 5' 3.75" (1.619 m)  Wt 191 lb (86.637 kg)  BMI 33.04 kg/m2  SpO2 97%  LMP 03/19/1986 Wt Readings from Last 3 Encounters:  05/17/12 191 lb (86.637 kg)  01/20/12 187 lb (84.823 kg)  11/24/11 186 lb (84.369 kg)   Constitutional: She is overweight, but appears well-developed and well-nourished. No distress. Dtr [name] at side Neck: Normal range of motion. Neck supple. No JVD present. No thyromegaly present.  Cardiovascular: Normal rate, regular rhythm and normal heart  sounds.  No murmur heard. trace BLE edema at ankles (atop fatty ankles). Pulmonary/Chest: Effort normal at rest and breath sounds normal. She has exp wheezes, no rhonchi or crackles.  Abdominal: Soft. Bowel sounds are normal. She exhibits no distension. There is no tenderness. no masses Rectal - defer today Psychiatric: She has a normal mood and affect. Her behavior is normal. Judgment and thought content normal.   Lab Results  Component Value Date   WBC 7.9 11/12/2010   HGB 12.4 11/12/2010   HCT 37.3 11/12/2010   PLT 235.0 11/12/2010   GLUCOSE 88 06/05/2011   CHOL 202* 11/12/2010   TRIG 102.0 11/12/2010   HDL 45.80 11/12/2010   LDLDIRECT 140.2 11/12/2010   LDLCALC 89 01/09/2009   ALT 19 06/05/2011   AST 22 06/05/2011   NA 140 06/05/2011   K 3.6 06/05/2011   CL 101 06/05/2011   CREATININE 0.9 06/05/2011   BUN 11 06/05/2011   CO2 30 06/05/2011   TSH 1.74 06/05/2011   HGBA1C 6.6* 06/05/2011       Assessment & Plan:  Edema - no evidence for volume overload - will reduce dose amlodipine and increase diuretic dose  Cough, subop controlled asthma on exam - ?compliance - see below (asthma)  Rectal bleeding - hx same with hemorrhoids - refill supp and follow up PCP/GI if unimproved - HD stable and no active bleeding

## 2012-05-17 NOTE — Assessment & Plan Note (Signed)
Reduce dose amlodipine due to edema Lasix prn - BP Readings from Last 3 Encounters:  05/17/12 110/80  01/20/12 160/98  11/24/11 140/80

## 2012-05-27 ENCOUNTER — Ambulatory Visit (HOSPITAL_COMMUNITY)

## 2012-06-07 ENCOUNTER — Ambulatory Visit (INDEPENDENT_AMBULATORY_CARE_PROVIDER_SITE_OTHER): Payer: Medicare Other | Admitting: Internal Medicine

## 2012-06-07 ENCOUNTER — Encounter: Payer: Self-pay | Admitting: Internal Medicine

## 2012-06-07 VITALS — BP 150/90 | HR 80 | Temp 98.6°F | Resp 16 | Wt 190.5 lb

## 2012-06-07 DIAGNOSIS — M545 Low back pain: Secondary | ICD-10-CM | POA: Diagnosis not present

## 2012-06-07 DIAGNOSIS — G8911 Acute pain due to trauma: Secondary | ICD-10-CM | POA: Diagnosis not present

## 2012-06-07 DIAGNOSIS — J45909 Unspecified asthma, uncomplicated: Secondary | ICD-10-CM

## 2012-06-07 DIAGNOSIS — E119 Type 2 diabetes mellitus without complications: Secondary | ICD-10-CM

## 2012-06-07 DIAGNOSIS — R05 Cough: Secondary | ICD-10-CM | POA: Diagnosis not present

## 2012-06-07 DIAGNOSIS — R42 Dizziness and giddiness: Secondary | ICD-10-CM

## 2012-06-07 DIAGNOSIS — R609 Edema, unspecified: Secondary | ICD-10-CM | POA: Insufficient documentation

## 2012-06-07 DIAGNOSIS — I10 Essential (primary) hypertension: Secondary | ICD-10-CM

## 2012-06-07 MED ORDER — AMLODIPINE BESYLATE 5 MG PO TABS: 2.5000 mg | ORAL_TABLET | Freq: Every day | ORAL | Status: DC

## 2012-06-07 MED ORDER — MOMETASONE FUROATE 50 MCG/ACT NA SUSP: 1.0000 | Freq: Every day | NASAL | Status: DC

## 2012-06-07 MED ORDER — GLIPIZIDE-METFORMIN HCL 2.5-500 MG PO TABS: 1.0000 | ORAL_TABLET | Freq: Two times a day (BID) | ORAL | Status: DC

## 2012-06-07 MED ORDER — FLUOXETINE HCL 10 MG PO TABS: 10.0000 mg | ORAL_TABLET | Freq: Every day | ORAL | Status: DC

## 2012-06-07 MED ORDER — EPINEPHRINE 0.3 MG/0.3ML IJ DEVI: 0.3000 mg | INTRAMUSCULAR | Status: DC | PRN

## 2012-06-07 MED ORDER — MECLIZINE HCL 12.5 MG PO TABS: 12.5000 mg | ORAL_TABLET | Freq: Three times a day (TID) | ORAL | Status: AC | PRN

## 2012-06-07 MED ORDER — MELOXICAM 7.5 MG PO TABS: 7.5000 mg | ORAL_TABLET | Freq: Every day | ORAL | Status: DC

## 2012-06-07 MED ORDER — FUROSEMIDE 20 MG PO TABS: 20.0000 mg | ORAL_TABLET | Freq: Every day | ORAL | Status: DC | PRN

## 2012-06-07 MED ORDER — TRAMADOL HCL 50 MG PO TABS: 50.0000 mg | ORAL_TABLET | Freq: Two times a day (BID) | ORAL | Status: DC | PRN

## 2012-06-07 MED ORDER — POTASSIUM CHLORIDE CRYS ER 20 MEQ PO TBCR: 20.0000 meq | EXTENDED_RELEASE_TABLET | Freq: Two times a day (BID) | ORAL | Status: DC

## 2012-06-07 MED ORDER — LOSARTAN POTASSIUM-HCTZ 100-12.5 MG PO TABS: 1.0000 | ORAL_TABLET | Freq: Every day | ORAL | Status: DC

## 2012-06-07 MED ORDER — ALBUTEROL SULFATE HFA 108 (90 BASE) MCG/ACT IN AERS: 2.0000 | INHALATION_SPRAY | Freq: Four times a day (QID) | RESPIRATORY_TRACT | Status: DC | PRN

## 2012-06-07 MED ORDER — BUDESONIDE 180 MCG/ACT IN AEPB: 1.0000 | INHALATION_SPRAY | Freq: Two times a day (BID) | RESPIRATORY_TRACT | Status: DC

## 2012-06-07 MED ORDER — FEXOFENADINE HCL 180 MG PO TABS: 180.0000 mg | ORAL_TABLET | Freq: Every day | ORAL | Status: DC

## 2012-06-07 NOTE — Progress Notes (Signed)
Subjective:    [name], female    DOB: June 11, 1946, 66 y.o.   MRN: [id number]  Cough Associated symptoms include shortness of breath. Pertinent negatives include no chills, headaches or rash.  Sinusitis Associated symptoms include coughing and shortness of breath. Pertinent negatives include no chills, congestion, headaches or sinus pressure.  Shortness of Breath Pertinent negatives include no abdominal pain, headaches or rash.  Back Pain Pertinent negatives include no abdominal pain, headaches, numbness or weakness.   C/o dizziness x2 d  F/u LBP - - it is better  4/10 on the L (LS and L buttock), worse with walking. Better w/rest C/o mild L neck and now is c/o some R shoulder pain  BP nl at home  Wt Readings from Last 3 Encounters:  06/07/12 190 lb 8 oz (86.41 kg)  05/17/12 191 lb (86.637 kg)  01/20/12 187 lb (84.823 kg)   BP Readings from Last 3 Encounters:  06/07/12 150/90  05/17/12 110/80  01/20/12 160/98      Review of Systems  Constitutional: Negative for chills, activity change, appetite change, fatigue and unexpected weight change.  HENT: Negative for congestion, mouth sores and sinus pressure.   Eyes: Negative for visual disturbance.  Respiratory: Positive for cough and shortness of breath. Negative for chest tightness.   Gastrointestinal: Negative for nausea and abdominal pain.  Genitourinary: Negative for frequency, difficulty urinating and vaginal pain.  Musculoskeletal: Positive for back pain and gait problem.  Skin: Negative for pallor and rash.  Neurological: Negative for dizziness, tremors, weakness, numbness and headaches.  Psychiatric/Behavioral: Negative for confusion and disturbed wake/sleep cycle.       Objective:   Physical Exam  Constitutional: She appears well-developed and well-nourished. No distress.  HENT:  Head: Normocephalic.  Right Ear: External ear normal.  Left Ear: External ear normal.  Nose: Nose normal.    Mouth/Throat: Oropharynx is clear and moist.       eryth throat  Eyes: Conjunctivae are normal. Pupils are equal, round, and reactive to light. Right eye exhibits no discharge. Left eye exhibits no discharge.  Neck: Normal range of motion. Neck supple. No JVD present. No tracheal deviation present. No thyromegaly present.  Cardiovascular: Normal rate, regular rhythm and normal heart sounds.   Pulmonary/Chest: No stridor. No respiratory distress. She has no wheezes.  Abdominal: Soft. Bowel sounds are normal. She exhibits no distension and no mass. There is no tenderness. There is no rebound and no guarding.  Musculoskeletal: She exhibits tenderness (LS, L buttock is tender). She exhibits no edema.  Lymphadenopathy:    She has no cervical adenopathy.  Neurological: She displays normal reflexes. No cranial nerve deficit. She exhibits normal muscle tone. Coordination normal.  Skin: No rash noted. No erythema.  Psychiatric: Her behavior is normal. Judgment and thought content normal.       subdued  H-P is neg B   Lab Results  Component Value Date   WBC 7.9 11/12/2010   HGB 12.4 11/12/2010   HCT 37.3 11/12/2010   PLT 235.0 11/12/2010   GLUCOSE 88 06/05/2011   CHOL 202* 11/12/2010   TRIG 102.0 11/12/2010   HDL 45.80 11/12/2010   LDLDIRECT 140.2 11/12/2010   LDLCALC 89 01/09/2009   ALT 19 06/05/2011   AST 22 06/05/2011   NA 140 06/05/2011   K 3.6 06/05/2011   CL 101 06/05/2011   CREATININE 0.9 06/05/2011   BUN 11 06/05/2011   CO2 30 06/05/2011   TSH 1.74 06/05/2011  HGBA1C 6.6* 06/05/2011          Assessment & Plan:

## 2012-06-07 NOTE — Assessment & Plan Note (Signed)
Continue with current prescription therapy as reflected on the Med list.  

## 2012-06-07 NOTE — Assessment & Plan Note (Signed)
Better  

## 2012-06-07 NOTE — Assessment & Plan Note (Signed)
Meclizine prn Rest No driving if dizzy

## 2012-06-07 NOTE — Assessment & Plan Note (Signed)
Slowly getting better Continue with current prescription therapy as reflected on the Med list.

## 2012-06-07 NOTE — Assessment & Plan Note (Signed)
Doing better  She is on 1/2 amlodipine

## 2012-06-07 NOTE — Patient Instructions (Signed)
Rest No driving if dizzy  BP Readings from Last 3 Encounters:  06/07/12 150/90  05/17/12 110/80  01/20/12 160/98

## 2012-06-07 NOTE — Assessment & Plan Note (Signed)
Better w/Rx change Wt Readings from Last 3 Encounters:  06/07/12 190 lb 8 oz (86.41 kg)  05/17/12 191 lb (86.637 kg)  01/20/12 187 lb (84.823 kg)

## 2012-06-25 ENCOUNTER — Ambulatory Visit: Payer: Medicare Other | Admitting: Internal Medicine

## 2012-06-25 DIAGNOSIS — Z0289 Encounter for other administrative examinations: Secondary | ICD-10-CM

## 2012-06-29 ENCOUNTER — Ambulatory Visit (HOSPITAL_COMMUNITY)
Admission: RE | Admit: 2012-06-29 | Discharge: 2012-06-29 | Disposition: A | Discharge status: Home / Self Care | Payer: Medicare Other | Source: Ambulatory Visit | Attending: Obstetrics and Gynecology | Admitting: Obstetrics and Gynecology

## 2012-06-29 DIAGNOSIS — Z1231 Encounter for screening mammogram for malignant neoplasm of breast: Secondary | ICD-10-CM | POA: Diagnosis not present

## 2012-08-24 ENCOUNTER — Encounter: Payer: Self-pay | Admitting: Internal Medicine

## 2012-08-24 ENCOUNTER — Ambulatory Visit (INDEPENDENT_AMBULATORY_CARE_PROVIDER_SITE_OTHER): Payer: Medicare Other | Admitting: Internal Medicine

## 2012-08-24 ENCOUNTER — Telehealth: Payer: Self-pay | Admitting: *Deleted

## 2012-08-24 VITALS — BP 158/88 | HR 72 | Temp 98.4°F | Resp 16 | Wt 190.0 lb

## 2012-08-24 DIAGNOSIS — M545 Low back pain, unspecified: Secondary | ICD-10-CM

## 2012-08-24 DIAGNOSIS — I10 Essential (primary) hypertension: Secondary | ICD-10-CM | POA: Diagnosis not present

## 2012-08-24 DIAGNOSIS — G8911 Acute pain due to trauma: Secondary | ICD-10-CM

## 2012-08-24 DIAGNOSIS — E119 Type 2 diabetes mellitus without complications: Secondary | ICD-10-CM

## 2012-08-24 MED ORDER — CYCLOBENZAPRINE HCL 5 MG PO TABS: 5.0000 mg | ORAL_TABLET | Freq: Two times a day (BID) | ORAL | Status: DC | PRN

## 2012-08-24 MED ORDER — KETOROLAC TROMETHAMINE 60 MG/2ML IM SOLN
60.0000 mg | Freq: Once | INTRAMUSCULAR | Status: AC
  Administered 2012-08-24: 60 mg via INTRAMUSCULAR

## 2012-08-24 MED ORDER — MELOXICAM 7.5 MG PO TABS: 7.5000 mg | ORAL_TABLET | Freq: Every day | ORAL | Status: DC

## 2012-08-24 MED ORDER — TRAMADOL HCL 50 MG PO TABS: 50.0000 mg | ORAL_TABLET | Freq: Two times a day (BID) | ORAL | Status: DC | PRN

## 2012-08-24 NOTE — Progress Notes (Signed)
[name], female   DOB: 06/04/1946, 66 y.o.   MRN: [id number]   Subjective:    [name], female    DOB: 1946-03-09, 66 y.o.   MRN: [id number]  Back Pain This is a recurrent problem. The current episode started in the past 7 days. The problem occurs constantly. The problem is unchanged. The pain is present in the gluteal, lumbar spine and sacro-iliac (L LS area). The quality of the pain is described as stabbing. The pain does not radiate. The pain is at a severity of 9/10. The pain is severe. The pain is worse during the day. The symptoms are aggravated by bending, sitting, standing and twisting. Pertinent negatives include no bowel incontinence or weakness. Risk factors include recent trauma. She has tried analgesics and ice for the symptoms. The treatment provided mild relief.   C/o dizziness x2 d  F/u LBP - - it is better  4/10 on the L (LS and L buttock), worse with walking. Better w/rest C/o mild L neck and now is c/o some R shoulder pain  BP nl at home  Wt Readings from Last 3 Encounters:  08/24/12 190 lb (86.183 kg)  06/07/12 190 lb 8 oz (86.41 kg)  05/17/12 191 lb (86.637 kg)   BP Readings from Last 3 Encounters:  08/24/12 158/88  06/07/12 150/90  05/17/12 110/80      Review of Systems  Constitutional: Negative for activity change, appetite change, fatigue and unexpected weight change.  HENT: Negative for mouth sores.   Eyes: Negative for visual disturbance.  Respiratory: Negative for chest tightness.   Gastrointestinal: Negative for nausea and bowel incontinence.  Genitourinary: Negative for frequency, difficulty urinating and vaginal pain.  Musculoskeletal: Positive for back pain and gait problem.  Skin: Negative for pallor.  Neurological: Negative for dizziness, tremors and weakness.  Psychiatric/Behavioral: Negative for confusion and sleep disturbance.       Objective:   Physical Exam  Constitutional: She appears well-developed  and well-nourished. No distress.  HENT:  Head: Normocephalic.  Right Ear: External ear normal.  Left Ear: External ear normal.  Nose: Nose normal.  Mouth/Throat: Oropharynx is clear and moist.       eryth throat  Eyes: Conjunctivae normal are normal. Pupils are equal, round, and reactive to light. Right eye exhibits no discharge. Left eye exhibits no discharge.  Neck: Normal range of motion. Neck supple. No JVD present. No tracheal deviation present. No thyromegaly present.  Cardiovascular: Normal rate, regular rhythm and normal heart sounds.   Pulmonary/Chest: No stridor. No respiratory distress. She has no wheezes.  Abdominal: Soft. Bowel sounds are normal. She exhibits no distension and no mass. There is no tenderness. There is no rebound and no guarding.  Musculoskeletal: She exhibits tenderness (LS, L buttock is tender). She exhibits no edema.  Lymphadenopathy:    She has no cervical adenopathy.  Neurological: She displays normal reflexes. No cranial nerve deficit. She exhibits normal muscle tone. Coordination normal.  Skin: No rash noted. No erythema.  Psychiatric: Her behavior is normal. Judgment and thought content normal.       subdued  strait leg elev is neg B   Lab Results  Component Value Date   WBC 7.9 11/12/2010   HGB 12.4 11/12/2010   HCT 37.3 11/12/2010   PLT 235.0 11/12/2010   GLUCOSE 88 06/05/2011   CHOL 202* 11/12/2010   TRIG 102.0 11/12/2010   HDL 45.80 11/12/2010   LDLDIRECT 140.2 11/12/2010  LDLCALC 89 01/09/2009   ALT 19 06/05/2011   AST 22 06/05/2011   NA 140 06/05/2011   K 3.6 06/05/2011   CL 101 06/05/2011   CREATININE 0.9 06/05/2011   BUN 11 06/05/2011   CO2 30 06/05/2011   TSH 1.74 06/05/2011   HGBA1C 6.6* 06/05/2011          Assessment & Plan:

## 2012-08-24 NOTE — Patient Instructions (Addendum)
To work in 1 wk if ok

## 2012-08-24 NOTE — Assessment & Plan Note (Signed)
11/13 relapsed - L LS spastic pain Stretching Tramadol, meloxicam, flexeril

## 2012-08-24 NOTE — Assessment & Plan Note (Signed)
Continue with current prescription therapy as reflected on the Med list.  

## 2012-08-24 NOTE — Telephone Encounter (Signed)
Faxed [facility] note to [facility] per pt's request.

## 2012-09-16 ENCOUNTER — Ambulatory Visit (INDEPENDENT_AMBULATORY_CARE_PROVIDER_SITE_OTHER): Payer: Medicare Other | Admitting: Family Medicine

## 2012-09-16 ENCOUNTER — Ambulatory Visit: Payer: Medicare Other

## 2012-09-16 VITALS — BP 150/71 | HR 66 | Temp 98.7°F | Resp 16 | Ht 63.5 in | Wt 186.0 lb

## 2012-09-16 DIAGNOSIS — E119 Type 2 diabetes mellitus without complications: Secondary | ICD-10-CM

## 2012-09-16 DIAGNOSIS — R059 Cough, unspecified: Secondary | ICD-10-CM

## 2012-09-16 DIAGNOSIS — R0602 Shortness of breath: Secondary | ICD-10-CM | POA: Diagnosis not present

## 2012-09-16 DIAGNOSIS — J45901 Unspecified asthma with (acute) exacerbation: Secondary | ICD-10-CM

## 2012-09-16 DIAGNOSIS — J019 Acute sinusitis, unspecified: Secondary | ICD-10-CM

## 2012-09-16 DIAGNOSIS — R05 Cough: Secondary | ICD-10-CM | POA: Diagnosis not present

## 2012-09-16 DIAGNOSIS — J45909 Unspecified asthma, uncomplicated: Secondary | ICD-10-CM

## 2012-09-16 LAB — POCT GLYCOSYLATED HEMOGLOBIN (HGB A1C): Hemoglobin A1C: 7.2

## 2012-09-16 LAB — POCT CBC
Granulocyte percent: 70.1 % (ref 37–80)
HCT, POC: 44.3 % (ref 37.7–47.9)
Hemoglobin: 13.4 g/dL (ref 12.2–16.2)
Lymph, poc: 2.9 (ref 0.6–3.4)
MCH, POC: 26.3 pg — AB (ref 27–31.2)
MCHC: 30.2 g/dL — AB (ref 31.8–35.4)
MCV: 86.8 fL (ref 80–97)
MID (cbc): 1 — AB (ref 0–0.9)
MPV: 8.8 fL (ref 0–99.8)
POC Granulocyte: 9.1 — AB (ref 2–6.9)
POC LYMPH PERCENT: 22 % (ref 10–50)
POC MID %: 7.9 %M (ref 0–12)
Platelet Count, POC: 275 10*3/uL (ref 142–424)
RBC: 5.1 M/uL (ref 4.04–5.48)
RDW, POC: 14.1 %
WBC: 13 10*3/uL — AB (ref 4.6–10.2)

## 2012-09-16 MED ORDER — IPRATROPIUM BROMIDE 0.02 % IN SOLN
0.5000 mg | Freq: Once | RESPIRATORY_TRACT | Status: AC
  Administered 2012-09-16: 0.5 mg via RESPIRATORY_TRACT

## 2012-09-16 MED ORDER — ALBUTEROL SULFATE (2.5 MG/3ML) 0.083% IN NEBU
2.5000 mg | INHALATION_SOLUTION | Freq: Once | RESPIRATORY_TRACT | Status: AC
  Administered 2012-09-16: 2.5 mg via RESPIRATORY_TRACT

## 2012-09-16 MED ORDER — METHYLPREDNISOLONE SODIUM SUCC 125 MG IJ SOLR
125.0000 mg | Freq: Once | INTRAMUSCULAR | Status: AC
  Administered 2012-09-16: 125 mg via INTRAMUSCULAR

## 2012-09-16 MED ORDER — PROMETHAZINE-DM 6.25-15 MG/5ML PO SYRP: 2.5000 mL | ORAL_SOLUTION | Freq: Every evening | ORAL | Status: DC | PRN

## 2012-09-16 MED ORDER — METHYLPREDNISOLONE 4 MG PO KIT: PACK | ORAL | Status: DC

## 2012-09-16 MED ORDER — IPRATROPIUM-ALBUTEROL 0.5-2.5 (3) MG/3ML IN SOLN: 3.0000 mL | Freq: Four times a day (QID) | RESPIRATORY_TRACT | Status: DC | PRN

## 2012-09-16 MED ORDER — AZITHROMYCIN 250 MG PO TABS: ORAL_TABLET | ORAL | Status: DC

## 2012-09-16 MED ORDER — BENZONATATE 100 MG PO CAPS: 100.0000 mg | ORAL_CAPSULE | Freq: Three times a day (TID) | ORAL | Status: DC | PRN

## 2012-09-16 NOTE — Progress Notes (Signed)
[facility]:  Office Visit  Chief Complaint:  Chief Complaint  Patient presents with  . Cough    Productive x 3 days  . Asthma    Has tried inhalers and breathing treatments and it's not helping    HPI: Pamela Morgan is a 65 y.o. female who complains of  SOB. She took her neb treatment at home and also inhaler without relief. She is asthmatic and has diabetes. She has had a productive green  Cough that is also in her chest since Friday, has had sxs due to weather change. Denies fevers, + chills.   Past Medical History  Diagnosis Date  . IBS (irritable bowel syndrome)     Dr Juanda Chance  . Eczema   . Hemorrhoids     colo 06/2007 - int hem  . Hyperlipidemia   . Asthma   . Allergy     food/ rhinitis  . Ovarian cyst   . Fibroid   . Hypertension   . Diabetes mellitus     type 2  . Hiatal hernia   . Heart murmur    Past Surgical History  Procedure Date  . Hemorrhoid surgery 2007  . Abdominal hysterectomy 1987    TAH,RSO  . Oophorectomy 1987    TAH,RSO  . Tubal ligation    History   Social History  . Marital Status: Legally Separated    Spouse Name: N/A    Number of Children: N/A  . Years of Education: N/A   Social History Main Topics  . Smoking status: Never Smoker   . Smokeless tobacco: Never Used  . Alcohol Use: No  . Drug Use: No  . Sexually Active: No   Other Topics Concern  . None   Social History Narrative  . None   Family History  Problem Relation Age of Onset  . Allergies Mother   . Hypertension Sister    Allergies  Allergen Reactions  . Clonidine Hydrochloride     REACTION: unspecified  . Codeine     REACTION: itching  . Corn-Containing Products   . Ezetimibe-Simvastatin   . Fish Allergy   . Fish Oil   . Hydrochlorothiazide W-Triamterene   . Iodine   . Isradipine   . Latex   . Lovastatin   . Metformin     REACTION: bloating at high dose  . Other Itching    Adhesive tape   . Peanut-Containing Drug Products    "nuts"  . Statins   . Sulfadiazine   . Sulfamethoxazole     REACTION: unspecified   Prior to Admission medications   Medication Sig Start Date End Date Taking? Authorizing Provider  albuterol (PROAIR HFA) 108 (90 BASE) MCG/ACT inhaler Inhale 2 puffs into the lungs 4 (four) times daily as needed. 06/07/12   Georgina Quint Plotnikov, MD  amLODipine (NORVASC) 5 MG tablet Take 0.5 tablets (2.5 mg total) by mouth daily. 06/07/12   Georgina Quint Plotnikov, MD  aspirin 81 MG tablet Take 81 mg by mouth daily.      Historical Provider, MD  budesonide (PULMICORT) 180 MCG/ACT inhaler Inhale 1 puff into the lungs 2 (two) times daily. 06/07/12   Georgina Quint Plotnikov, MD  budesonide-formoterol (SYMBICORT) 160-4.5 MCG/ACT inhaler Inhale 2 puffs into the lungs 2 (two) times daily. 05/17/12 05/17/13  Newt Lukes, MD  Cholecalciferol (EQL VITAMIN D3) 1000 UNITS tablet Take 1,000 Units by mouth daily.      Historical Provider, MD  cyclobenzaprine (FLEXERIL) 5  MG tablet Take 1 tablet (5 mg total) by mouth 2 (two) times daily as needed for muscle spasms. 08/24/12   Georgina Quint Plotnikov, MD  EPINEPHrine (EPIPEN) 0.3 mg/0.3 mL DEVI Inject 0.3 mLs (0.3 mg total) into the muscle as needed. 06/07/12   Georgina Quint Plotnikov, MD  fexofenadine (ALLEGRA) 180 MG tablet Take 1 tablet (180 mg total) by mouth daily. 06/07/12   Georgina Quint Plotnikov, MD  FLUoxetine (PROZAC) 10 MG tablet Take 1 tablet (10 mg total) by mouth daily. 06/07/12   Georgina Quint Plotnikov, MD  furosemide (LASIX) 20 MG tablet Take 1 tablet (20 mg total) by mouth daily as needed (ankle swelling). 06/07/12 06/07/13  Georgina Quint Plotnikov, MD  glipiZIDE-metformin (METAGLIP) 2.5-500 MG per tablet Take 1 tablet by mouth 2 (two) times daily before a meal. 06/07/12   Georgina Quint Plotnikov, MD  glucose blood test strip Use as instructed 06/05/11   Tresa Garter, MD  hydrocortisone (ANUSOL-HC) 25 MG suppository Place 1 suppository (25 mg total) rectally daily. 05/17/12   Newt Lukes, MD  losartan-hydrochlorothiazide (HYZAAR) 100-12.5 MG per tablet Take 1 tablet by mouth daily. 06/07/12   Georgina Quint Plotnikov, MD  meloxicam (MOBIC) 7.5 MG tablet Take 1 tablet (7.5 mg total) by mouth daily. 08/24/12 08/24/13  Georgina Quint Plotnikov, MD  mometasone (NASONEX) 50 MCG/ACT nasal spray Place 1 spray into the nose daily. 06/07/12   Georgina Quint Plotnikov, MD  potassium chloride SA (K-DUR,KLOR-CON) 20 MEQ tablet Take 1 tablet (20 mEq total) by mouth 2 (two) times daily. 06/07/12   Georgina Quint Plotnikov, MD  traMADol (ULTRAM) 50 MG tablet Take 1 tablet (50 mg total) by mouth 2 (two) times daily as needed. 08/24/12   Georgina Quint Plotnikov, MD     ROS: The patient denies fevers, night sweats, unintentional weight loss, chest pain, palpitations, nausea, vomiting, abdominal pain, dysuria, hematuria, melena, numbness, weakness, or tingling.  All other systems have been reviewed and were otherwise negative with the exception of those mentioned in the HPI and as above.    PHYSICAL EXAM: Filed Vitals:   09/16/12 1119  BP: 150/71  Pulse: 66  Temp: 98.7 F (37.1 C)  Resp: 16   Filed Vitals:   09/16/12 1119  Height: 5' 3.5" (1.613 m)  Weight: 186 lb (84.369 kg)   Body mass index is 32.43 kg/(m^2).  General: Alert, no acute distress HEENT:  Normocephalic, atraumatic, oropharynx patent. No exudates, + sinus tenderness BL, no exudates Cardiovascular:  Regular rate and rhythm, no rubs murmurs or gallops.  No Carotid bruits, radial pulse intact. No pedal edema.  Respiratory: Clear to auscultation bilaterally.  Decrease BS; no wheezes, rales, or rhonchi.  No cyanosis, no use of accessory musculature GI: No organomegaly, abdomen is soft and non-tender, positive bowel sounds.  No masses. Skin: No rashes. Neurologic: Facial musculature symmetric. Psychiatric: Patient is appropriate throughout our interaction. Lymphatic: No cervical lymphadenopathy Musculoskeletal: Gait  intact.   LABS: Results for orders placed in visit on 09/16/12  POCT CBC      Component Value Range   WBC 13.0 (*) 4.6 - 10.2 K/uL   Lymph, poc 2.9  0.6 - 3.4   POC LYMPH PERCENT 22.0  10 - 50 %L   MID (cbc) 1.0 (*) 0 - 0.9   POC MID % 7.9  0 - 12 %M   POC Granulocyte 9.1 (*) 2 - 6.9   Granulocyte percent 70.1  37 - 80 %G   RBC 5.10  4.04 - 5.48 M/uL   Hemoglobin 13.4  12.2 - 16.2 g/dL   HCT, POC 45.4  09.8 - 47.9 %   MCV 86.8  80 - 97 fL   MCH, POC 26.3 (*) 27 - 31.2 pg   MCHC 30.2 (*) 31.8 - 35.4 g/dL   RDW, POC 11.9     Platelet Count, POC 275  142 - 424 K/uL   MPV 8.8  0 - 99.8 fL  POCT GLYCOSYLATED HEMOGLOBIN (HGB A1C)      Component Value Range   Hemoglobin A1C 7.2       EKG/XRAY:   Primary read interpreted by Dr. Conley Rolls at [facility]. CXR no acute cardiopulmonary process   ASSESSMENT/PLAN: Encounter Diagnoses  Name Primary?  . SOB (shortness of breath) Yes  . Asthma exacerbation   . Diabetes mellitus   . Acute sinusitis   . Cough   . Asthmatic bronchitis     1. Solumedrol 125 mg IM x 1 2. Neb treatment in office-feels much better 3. Azithromycin abx  4. Medrol Dose Pak. 5. Tessalon perles and also wrote paper rx for hydromet syrup 120 ml, no refills  F/u prn, I will call her in 2 days to see how she is doing. Advise to go to ER if worsening sxs on meds   Talma Aguillard PHUONG, DO 09/20/2012 9:44 AM

## 2012-09-20 ENCOUNTER — Telehealth: Payer: Self-pay | Admitting: Family Medicine

## 2012-09-20 NOTE — Telephone Encounter (Signed)
Patietn doing better, able to sleep for first time last night. Decrease cough and SOB

## 2012-09-22 ENCOUNTER — Ambulatory Visit (INDEPENDENT_AMBULATORY_CARE_PROVIDER_SITE_OTHER): Payer: Medicare Other | Admitting: Internal Medicine

## 2012-09-22 ENCOUNTER — Encounter: Payer: Self-pay | Admitting: Internal Medicine

## 2012-09-22 VITALS — BP 160/90 | HR 80 | Temp 99.5°F | Resp 16 | Wt 188.0 lb

## 2012-09-22 DIAGNOSIS — E119 Type 2 diabetes mellitus without complications: Secondary | ICD-10-CM | POA: Diagnosis not present

## 2012-09-22 DIAGNOSIS — G8911 Acute pain due to trauma: Secondary | ICD-10-CM

## 2012-09-22 DIAGNOSIS — J209 Acute bronchitis, unspecified: Secondary | ICD-10-CM

## 2012-09-22 DIAGNOSIS — M545 Low back pain: Secondary | ICD-10-CM | POA: Diagnosis not present

## 2012-09-22 DIAGNOSIS — J45909 Unspecified asthma, uncomplicated: Secondary | ICD-10-CM | POA: Diagnosis not present

## 2012-09-22 MED ORDER — AMOXICILLIN-POT CLAVULANATE 875-125 MG PO TABS: 1.0000 | ORAL_TABLET | Freq: Two times a day (BID) | ORAL | Status: DC

## 2012-09-22 MED ORDER — HYDROCOD POLST-CHLORPHEN POLST 10-8 MG/5ML PO LQCR: 5.0000 mL | Freq: Two times a day (BID) | ORAL | Status: DC | PRN

## 2012-09-22 NOTE — Assessment & Plan Note (Signed)
Finishd steroids ussionex Cont inhalers

## 2012-09-22 NOTE — Progress Notes (Signed)
   Subjective:    [name], female    DOB: [birth year], 66 y.o.   MRN: [id number]  HPI C/o URI sx's x 2 wks. She took Medrol, Zpac a wk ago - a little better. F/u on asthma - worse F/u LBP - - it is better   L buttock/hip, worse with walking. Better w/rest C/o mild L neck and now is c/o some R shoulder pain  BP nl at home  Wt Readings from Last 3 Encounters:  09/22/12 188 lb (85.276 kg)  09/16/12 186 lb (84.369 kg)  08/24/12 190 lb (86.183 kg)   BP Readings from Last 3 Encounters:  09/22/12 160/90  09/16/12 150/71  08/24/12 158/88      Review of Systems  Constitutional: Negative for activity change, appetite change, fatigue and unexpected weight change.  HENT: Negative for mouth sores.   Eyes: Negative for visual disturbance.  Respiratory: Negative for chest tightness.   Gastrointestinal: Negative for nausea.  Genitourinary: Negative for frequency, difficulty urinating and vaginal pain.  Musculoskeletal: Positive for gait problem.  Skin: Negative for pallor.  Neurological: Negative for dizziness and tremors.  Psychiatric/Behavioral: Negative for confusion and sleep disturbance.       Objective:   Physical Exam  Constitutional: She appears well-developed and well-nourished. No distress.  HENT:  Head: Normocephalic.  Right Ear: External ear normal.  Left Ear: External ear normal.  Nose: Nose normal.  Mouth/Throat: Oropharynx is clear and moist.       eryth throat  Eyes: Conjunctivae normal are normal. Pupils are equal, round, and reactive to light. Right eye exhibits no discharge. Left eye exhibits no discharge.  Neck: Normal range of motion. Neck supple. No JVD present. No tracheal deviation present. No thyromegaly present.  Cardiovascular: Normal rate, regular rhythm and normal heart sounds.   Pulmonary/Chest: No stridor. No respiratory distress. She has no wheezes.  Abdominal: Soft. Bowel sounds are normal. She exhibits no distension and no mass.  There is no tenderness. There is no rebound and no guarding.  Musculoskeletal: She exhibits tenderness (LS, L buttock is tender). She exhibits no edema.  Lymphadenopathy:    She has no cervical adenopathy.  Neurological: She displays normal reflexes. No cranial nerve deficit. She exhibits normal muscle tone. Coordination normal.  Skin: No rash noted. No erythema.  Psychiatric: Her behavior is normal. Judgment and thought content normal.       subdued  strait leg elev is neg B hoarse   Lab Results  Component Value Date   WBC 13.0* 09/16/2012   HGB 13.4 09/16/2012   HCT 44.3 09/16/2012   PLT 235.0 11/12/2010   GLUCOSE 88 06/05/2011   CHOL 202* 11/12/2010   TRIG 102.0 11/12/2010   HDL 45.80 11/12/2010   LDLDIRECT 140.2 11/12/2010   LDLCALC 89 01/09/2009   ALT 19 06/05/2011   AST 22 06/05/2011   NA 140 06/05/2011   K 3.6 06/05/2011   CL 101 06/05/2011   CREATININE 0.9 06/05/2011   BUN 11 06/05/2011   CO2 30 06/05/2011   TSH 1.74 06/05/2011   HGBA1C 7.2 09/16/2012          Assessment & Plan:

## 2012-09-22 NOTE — Assessment & Plan Note (Signed)
Continue with current prescription therapy as reflected on the Med list.  

## 2012-09-22 NOTE — Assessment & Plan Note (Signed)
Pain is more in the L hip now Continue with current prescription therapy as reflected on the Med list.

## 2012-09-22 NOTE — Assessment & Plan Note (Signed)
Augmentin x 10 d 

## 2012-11-23 ENCOUNTER — Ambulatory Visit: Payer: Medicare Other

## 2012-11-23 ENCOUNTER — Ambulatory Visit (INDEPENDENT_AMBULATORY_CARE_PROVIDER_SITE_OTHER): Payer: Medicare Other | Admitting: Family Medicine

## 2012-11-23 VITALS — BP 190/90 | HR 66 | Temp 97.9°F | Resp 24 | Ht 63.0 in | Wt 181.8 lb

## 2012-11-23 DIAGNOSIS — R062 Wheezing: Secondary | ICD-10-CM

## 2012-11-23 DIAGNOSIS — R05 Cough: Secondary | ICD-10-CM

## 2012-11-23 DIAGNOSIS — J45909 Unspecified asthma, uncomplicated: Secondary | ICD-10-CM

## 2012-11-23 DIAGNOSIS — E119 Type 2 diabetes mellitus without complications: Secondary | ICD-10-CM | POA: Diagnosis not present

## 2012-11-23 DIAGNOSIS — R059 Cough, unspecified: Secondary | ICD-10-CM | POA: Diagnosis not present

## 2012-11-23 DIAGNOSIS — J45901 Unspecified asthma with (acute) exacerbation: Secondary | ICD-10-CM | POA: Diagnosis not present

## 2012-11-23 DIAGNOSIS — I10 Essential (primary) hypertension: Secondary | ICD-10-CM | POA: Diagnosis not present

## 2012-11-23 DIAGNOSIS — R0602 Shortness of breath: Secondary | ICD-10-CM | POA: Diagnosis not present

## 2012-11-23 LAB — POCT CBC
MCH, POC: 27.7 pg (ref 27–31.2)
MCHC: 31.8 g/dL (ref 31.8–35.4)
MCV: 86.9 fL (ref 80–97)
MID (cbc): 1.1 — AB (ref 0–0.9)
POC LYMPH PERCENT: 39.1 %L (ref 10–50)
Platelet Count, POC: 254 10*3/uL (ref 142–424)
RDW, POC: 13.7 %
WBC: 9.7 10*3/uL (ref 4.6–10.2)

## 2012-11-23 MED ORDER — ALBUTEROL SULFATE (2.5 MG/3ML) 0.083% IN NEBU
2.5000 mg | INHALATION_SOLUTION | Freq: Once | RESPIRATORY_TRACT | Status: AC
  Administered 2012-11-23: 2.5 mg via RESPIRATORY_TRACT

## 2012-11-23 MED ORDER — BENZONATATE 100 MG PO CAPS: 100.0000 mg | ORAL_CAPSULE | Freq: Three times a day (TID) | ORAL | Status: DC | PRN

## 2012-11-23 MED ORDER — IPRATROPIUM BROMIDE 0.02 % IN SOLN
0.5000 mg | Freq: Once | RESPIRATORY_TRACT | Status: AC
  Administered 2012-11-23: 0.5 mg via RESPIRATORY_TRACT

## 2012-11-23 MED ORDER — AZITHROMYCIN 250 MG PO TABS: ORAL_TABLET | ORAL | Status: DC

## 2012-11-23 MED ORDER — PREDNISONE 20 MG PO TABS: 40.0000 mg | ORAL_TABLET | Freq: Every day | ORAL | Status: DC

## 2012-11-23 NOTE — Patient Instructions (Signed)
Start the prednisone tonight - 2 pills per day for 5 days.  You can use the albuterol inhaler or nebulizer machine up to every 4 to 6 hours overnight if needed.  If increased use needed or worsening of symptoms overnight- call 911 or go to an emergency room. Start the antibiotic tonight, and the tessalon perles if needed for cough. Check your blood sugar twice per day when taking prednisone, and if it reads over 250 - call your doctor. Bring all your medicines to the visit tomorrow after 2pm with Dr. Neva Seat. Return to the clinic or go to the nearest emergency room if any of your symptoms worsen or new symptoms occur.

## 2012-11-23 NOTE — Progress Notes (Signed)
Subjective:    [name], female    DOB: 09-10-1946, 67 y.o.   MRN: [id number]  HPI Pamela Morgan is a 67 y.o. female Hx of multiple medical problems including HTN, DM2, hyperlipidemia, and asthma. Primary care: Dr. Posey Rea.   Cough and short of breath past 2 weeks, and wheezing. Feels worse this this week.  Coughing spells with vomiting and due to this having trouble keeping medicines down.  Vomiting 3 times per day with cough. Using albuterol inhaler every 3 hours now for past 3 days, duoneb nebulizer morning and night. Tingling in chest with cough but no pain or heaviness in chest. No known hx of CAD.MI or CHF.  Only symbicort as needed - has not used past few weeks.   Seen 09/16/12. Diagnosed with asthmatic bronchitis -  Treated with Solumedrol 125 mg IM x 1,  Neb treatment in office,  Azithromycin, Medrol Dose Pak., tessalon perles and also wrote paper rx for hydromet syrup 120 ml, no refills CXR report : Findings: The cardiac silhouette, mediastinal and hilar contours are within normal limits and stable. The lungs are clear. No pleural effusion. The bony thorax is intact. IMPRESSION: No acute cardiopulmonary findings.   DM2 - recent blood sugars: 125, 140. Not above 200.   Review of Systems  Constitutional: Negative for fever and chills.  HENT: Positive for congestion (min intially. ).   Respiratory: Positive for shortness of breath and wheezing. Negative for chest tightness.   Cardiovascular: Negative for chest pain, palpitations and leg swelling.   As above.       Objective:   Physical Exam  Vitals reviewed. Constitutional: She is oriented to person, place, and time. She appears well-developed and well-nourished. No distress.  HENT:  Head: Normocephalic and atraumatic.  Right Ear: Hearing, tympanic membrane, external ear and ear canal normal.  Left Ear: Hearing, tympanic membrane, external ear and ear canal normal.  Nose: Nose normal.  Mouth/Throat:  Oropharynx is clear and moist. No oropharyngeal exudate.  Eyes: Conjunctivae normal and EOM are normal. Pupils are equal, round, and reactive to light.  Neck: No JVD present.  Cardiovascular: Normal rate, regular rhythm, normal heart sounds, intact distal pulses and normal pulses.   No murmur heard. Pulmonary/Chest: Effort normal. No accessory muscle usage. No respiratory distress. She has no decreased breath sounds. She has wheezes. She has no rhonchi. She has no rales.       Diffuse expiratory wheeze initally.   Repeat exam after albuterol and atrovent x1 - faint end exp wheeze in LUL only. No rales.  Abdominal: Soft.  Musculoskeletal: She exhibits no edema.  Neurological: She is alert and oriented to person, place, and time.  Skin: Skin is warm and dry. No rash noted.  Psychiatric: She has a normal mood and affect. Her behavior is normal.      Results for orders placed in visit on 11/23/12  GLUCOSE, POCT (MANUAL RESULT ENTRY)      Component Value Range   POC Glucose 183 (*) 70 - 99 mg/dl  POCT CBC      Component Value Range   WBC 9.7  4.6 - 10.2 K/uL   Lymph, poc 3.8 (*) 0.6 - 3.4   POC LYMPH PERCENT 39.1  10 - 50 %L   MID (cbc) 1.1 (*) 0 - 0.9   POC MID % 11.3  0 - 12 %M   POC Granulocyte 4.8  2 - 6.9   Granulocyte percent 49.6  37 -  80 %G   RBC 4.84  4.04 - 5.48 M/uL   Hemoglobin 13.4  12.2 - 16.2 g/dL   HCT, POC 43.3  29.5 - 47.9 %   MCV 86.9  80 - 97 fL   MCH, POC 27.7  27 - 31.2 pg   MCHC 31.8  31.8 - 35.4 g/dL   RDW, POC 18.8     Platelet Count, POC 254  142 - 424 K/uL   MPV 9.7  0 - 99.8 fL   [facility] reading (PRIMARY) by  Dr. Neva Seat: CXR: NAD. Marland Kitchen      Assessment & Plan:  Pamela Morgan is a 67 y.o. female 1. Cough  DG Chest 2 View, POCT CBC, Brain natriuretic peptide, albuterol (PROVENTIL) (2.5 MG/3ML) 0.083% nebulizer solution 2.5 mg, ipratropium (ATROVENT) nebulizer solution 0.5 mg, benzonatate (TESSALON) 100 MG capsule  2. Wheezing  DG Chest 2 View, Brain  natriuretic peptide, albuterol (PROVENTIL) (2.5 MG/3ML) 0.083% nebulizer solution 2.5 mg, ipratropium (ATROVENT) nebulizer solution 0.5 mg  3. Asthmatic bronchitis  DG Chest 2 View, POCT CBC, albuterol (PROVENTIL) (2.5 MG/3ML) 0.083% nebulizer solution 2.5 mg, ipratropium (ATROVENT) nebulizer solution 0.5 mg, azithromycin (ZITHROMAX) 250 MG tablet, predniSONE (DELTASONE) 20 MG tablet  4. Diabetes mellitus  POCT glucose (manual entry), albuterol (PROVENTIL) (2.5 MG/3ML) 0.083% nebulizer solution 2.5 mg, ipratropium (ATROVENT) nebulizer solution 0.5 mg  5. Shortness of breath  Brain natriuretic peptide, albuterol (PROVENTIL) (2.5 MG/3ML) 0.083% nebulizer solution 2.5 mg, ipratropium (ATROVENT) nebulizer solution 0.5 mg  6. Asthma exacerbation  predniSONE (DELTASONE) 20 MG tablet   Probable asthmatic bronchitis flair.  Similar to November, but not as severe per pt. Improved with neb x 1 in office. Will continue nebs overnight or proair if milder sx's.  Start prednisone and z pak,  Recheck tomorrow with medications. Er precautions overnight discussed.  HTN - decreased control with posttussive emesis and off meds.  asx in office.  Will check BNP given hx of htn and wheeze, but no signs of CHF on exam or CXR.  DM - follow cbg's on prednisone.  Recheck tomorrow.  Meds ordered this encounter  Medications  . albuterol (PROVENTIL) (2.5 MG/3ML) 0.083% nebulizer solution 2.5 mg    Sig:   . ipratropium (ATROVENT) nebulizer solution 0.5 mg    Sig:   . azithromycin (ZITHROMAX) 250 MG tablet    Sig: Take 2 pills by mouth on day 1, then 1 pill by mouth per day on days 2 through 5.    Dispense:  6 each    Refill:  0  . predniSONE (DELTASONE) 20 MG tablet    Sig: Take 2 tablets (40 mg total) by mouth daily.    Dispense:  10 tablet    Refill:  0  . benzonatate (TESSALON) 100 MG capsule    Sig: Take 1 capsule (100 mg total) by mouth 3 (three) times daily as needed for cough.    Dispense:  20 capsule     Refill:  0   Patient Instructions  Start the prednisone tonight - 2 pills per day for 5 days.  You can use the albuterol inhaler or nebulizer machine up to every 4 to 6 hours overnight if needed.  If increased use needed or worsening of symptoms overnight- call 911 or go to an emergency room. Start the antibiotic tonight, and the tessalon perles if needed for cough. Check your blood sugar twice per day when taking prednisone, and if it reads over 250 - call your doctor. Bring  all your medicines to the visit tomorrow after 2pm with Dr. Neva Seat. Return to the clinic or go to the nearest emergency room if any of your symptoms worsen or new symptoms occur.

## 2012-11-24 ENCOUNTER — Ambulatory Visit (INDEPENDENT_AMBULATORY_CARE_PROVIDER_SITE_OTHER): Payer: Medicare Other | Admitting: Family Medicine

## 2012-11-24 ENCOUNTER — Encounter: Payer: Self-pay | Admitting: Family Medicine

## 2012-11-24 VITALS — BP 170/90 | HR 60 | Temp 98.1°F | Resp 16 | Ht 64.5 in | Wt 184.2 lb

## 2012-11-24 DIAGNOSIS — I10 Essential (primary) hypertension: Secondary | ICD-10-CM | POA: Diagnosis not present

## 2012-11-24 DIAGNOSIS — R05 Cough: Secondary | ICD-10-CM | POA: Diagnosis not present

## 2012-11-24 DIAGNOSIS — R062 Wheezing: Secondary | ICD-10-CM | POA: Diagnosis not present

## 2012-11-24 DIAGNOSIS — J45901 Unspecified asthma with (acute) exacerbation: Secondary | ICD-10-CM

## 2012-11-24 DIAGNOSIS — R059 Cough, unspecified: Secondary | ICD-10-CM

## 2012-11-24 DIAGNOSIS — E119 Type 2 diabetes mellitus without complications: Secondary | ICD-10-CM | POA: Diagnosis not present

## 2012-11-24 DIAGNOSIS — J45909 Unspecified asthma, uncomplicated: Secondary | ICD-10-CM | POA: Diagnosis not present

## 2012-11-24 NOTE — Progress Notes (Signed)
Subjective:    [name], female    DOB: 29-Nov-1945, 67 y.o.   MRN: [id number]  HPI Pamela Morgan is a 67 y.o. female See ov last night.  In summary - cough and dyspnea over past 2 weeks with increased albuterol use.  Improved aeration after nebulizer treatment last night, started on prednisone 40mg  qd for 5 days, and Z pak for asthmatic bronchitis. Continued on home nebs or HFA overnight.  Labs from yesterday:  Results for orders placed in visit on 11/23/12  GLUCOSE, POCT (MANUAL RESULT ENTRY)      Component Value Range   POC Glucose 183 (*) 70 - 99 mg/dl  POCT CBC      Component Value Range   WBC 9.7  4.6 - 10.2 K/uL   Lymph, poc 3.8 (*) 0.6 - 3.4   POC LYMPH PERCENT 39.1  10 - 50 %L   MID (cbc) 1.1 (*) 0 - 0.9   POC MID % 11.3  0 - 12 %M   POC Granulocyte 4.8  2 - 6.9   Granulocyte percent 49.6  37 - 80 %G   RBC 4.84  4.04 - 5.48 M/uL   Hemoglobin 13.4  12.2 - 16.2 g/dL   HCT, POC 40.9  81.1 - 47.9 %   MCV 86.9  80 - 97 fL   MCH, POC 27.7  27 - 31.2 pg   MCHC 31.8  31.8 - 35.4 g/dL   RDW, POC 91.4     Platelet Count, POC 254  142 - 424 K/uL   MPV 9.7  0 - 99.8 fL  BNP still pending.  CXR report from last night: CHEST - 2 VIEW Comparison: 09/16/2012 Findings: The lungs are clear without focal consolidation, edema, effusion or pneumothorax. Cardiopericardial silhouette is within  normal limits for size. Imaged bony structures of the thorax are intact. IMPRESSION: Normal exam.  Few coughing spells overnight, but better than it has been.  Here with dtr: [name]. Used nebulizer this morning, didn't need any medicine overnight to sleep. Hasn't used inhaler today since. No fevers overnight. No vomiting today. Able to take some of meds today, took hyzaar this morning, but not amlodipine.  On med review (brought meds today) - is not taking symbicort, but has been taking pulmicort since august as needed - once per day only.   Blood sugar 282 this morning, but doesn't  think diabetes meds were taken today.   Review of Systems  Constitutional: Negative for fever and chills.  Respiratory: Positive for cough and wheezing.   Cardiovascular: Negative for chest pain.  Neurological: Positive for headaches (minimal headache. ). Negative for speech difficulty, weakness and light-headedness.       Objective:   Physical Exam  Vitals reviewed. Constitutional: She is oriented to person, place, and time. She appears well-developed and well-nourished. No distress.  HENT:  Head: Normocephalic and atraumatic.  Right Ear: Hearing, tympanic membrane, external ear and ear canal normal.  Left Ear: Hearing, tympanic membrane, external ear and ear canal normal.  Nose: Nose normal.  Mouth/Throat: Oropharynx is clear and moist. No oropharyngeal exudate.  Eyes: Conjunctivae normal and EOM are normal. Pupils are equal, round, and reactive to light.  Cardiovascular: Normal rate, regular rhythm, normal heart sounds and intact distal pulses.   No murmur heard. Pulmonary/Chest: Effort normal. No respiratory distress. She has wheezes (few end expiratory wheezes. ) in the right middle field, the right lower field and the left lower field. She has  no rhonchi. She has no rales.  Neurological: She is alert and oriented to person, place, and time.  Skin: Skin is warm and dry. No rash noted.  Psychiatric: She has a normal mood and affect. Her behavior is normal.          Assessment & Plan:  Pamela Morgan is a 67 y.o. female 1. Asthma exacerbation - improving, stable.  Discussed albuterol use, and reviewed her home meds in office and clarified plan. Understanding expressed. rtc precautions.   2. Cough - as above.  Advised on albuterol use if wheezing and cough, and tessalon if needed.   3. DM2 (diabetes mellitus, type 2) - uncontrolled off med last night - watch home blood sugars , and if remain elevated on prednisone, may need temporary med adjustment.   4. HTN (hypertension) -  recheck better, but high as off normal regimen this am.  Restart usual meds, and follow up with primary provider.     Recheck in few days if not continuing to improve - sooner if worse. - RTC/ER precautions discussed.   Patient Instructions  Continue prednisone, azithromycin as prescribed.  Should increase pulmicort to twice per day for asthma as this was the dose prescribed by your doctor. Tessalon perles if needed for cough, but should be using albuterol inhaler up to every 4 hours if cough with wheezing. If more frequent dosing needed, or any worsening of your symptoms - return to clinic.   Take your blood pressure and diabetes medicines as prescribed. If your blood sugars remain over 250 on medicines - call or return to clinic, and keep regular follow up with your primary care provider.   Return to the clinic or go to the nearest emergency room if any of your symptoms worsen or new symptoms occur.

## 2012-11-24 NOTE — Patient Instructions (Signed)
Continue prednisone, azithromycin as prescribed.  Should increase pulmicort to twice per day for asthma as this was the dose prescribed by your doctor. Tessalon perles if needed for cough, but should be using albuterol inhaler up to every 4 hours if cough with wheezing. If more frequent dosing needed, or any worsening of your symptoms - return to clinic.   Take your blood pressure and diabetes medicines as prescribed. If your blood sugars remain over 250 on medicines - call or return to clinic, and keep regular follow up with your primary care provider.   Return to the clinic or go to the nearest emergency room if any of your symptoms worsen or new symptoms occur.

## 2012-12-16 ENCOUNTER — Telehealth: Payer: Self-pay | Admitting: Internal Medicine

## 2012-12-16 NOTE — Telephone Encounter (Signed)
Patient is requesting to switch from Dr. Posey Rea to Dr. Jonny Ruiz because she does not think that Dr. Posey Rea has been very attentive to her care , please advise

## 2012-12-16 NOTE — Telephone Encounter (Signed)
Very sorry, but I am unable to accept at this time 

## 2012-12-17 NOTE — Telephone Encounter (Signed)
Patient informed. 

## 2012-12-21 ENCOUNTER — Ambulatory Visit: Payer: Medicare Other | Admitting: Internal Medicine

## 2012-12-30 ENCOUNTER — Encounter: Payer: Self-pay | Admitting: Internal Medicine

## 2012-12-30 ENCOUNTER — Ambulatory Visit (INDEPENDENT_AMBULATORY_CARE_PROVIDER_SITE_OTHER): Payer: Medicare Other | Admitting: Internal Medicine

## 2012-12-30 VITALS — BP 184/88 | HR 76 | Temp 98.1°F | Resp 16 | Wt 188.0 lb

## 2012-12-30 DIAGNOSIS — E119 Type 2 diabetes mellitus without complications: Secondary | ICD-10-CM | POA: Diagnosis not present

## 2012-12-30 DIAGNOSIS — I10 Essential (primary) hypertension: Secondary | ICD-10-CM | POA: Diagnosis not present

## 2012-12-30 DIAGNOSIS — M545 Low back pain, unspecified: Secondary | ICD-10-CM

## 2012-12-30 DIAGNOSIS — J209 Acute bronchitis, unspecified: Secondary | ICD-10-CM | POA: Diagnosis not present

## 2012-12-30 DIAGNOSIS — J45909 Unspecified asthma, uncomplicated: Secondary | ICD-10-CM | POA: Diagnosis not present

## 2012-12-30 DIAGNOSIS — J309 Allergic rhinitis, unspecified: Secondary | ICD-10-CM

## 2012-12-30 MED ORDER — AMOXICILLIN-POT CLAVULANATE 875-125 MG PO TABS: 1.0000 | ORAL_TABLET | Freq: Two times a day (BID) | ORAL | Status: DC

## 2012-12-30 MED ORDER — HYDROCOD POLST-CHLORPHEN POLST 10-8 MG/5ML PO LQCR: 5.0000 mL | Freq: Two times a day (BID) | ORAL | Status: DC | PRN

## 2012-12-30 MED ORDER — METHYLPREDNISOLONE ACETATE 80 MG/ML IJ SUSP
80.0000 mg | Freq: Once | INTRAMUSCULAR | Status: AC
  Administered 2012-12-30: 80 mg via INTRAMUSCULAR

## 2012-12-30 MED ORDER — FLUTICASONE-SALMETEROL 100-50 MCG/DOSE IN AEPB: 1.0000 | INHALATION_SPRAY | Freq: Two times a day (BID) | RESPIRATORY_TRACT | Status: DC

## 2012-12-30 MED ORDER — LOSARTAN POTASSIUM-HCTZ 100-25 MG PO TABS: 1.0000 | ORAL_TABLET | Freq: Every day | ORAL | Status: DC

## 2012-12-30 NOTE — Assessment & Plan Note (Signed)
Chronic 3/13, 12/13, 3/14- flare ups Meds reviewed Treat URI Depo 80 mg im

## 2012-12-30 NOTE — Assessment & Plan Note (Signed)
Discussed Stretch Another round of PT was offered

## 2012-12-30 NOTE — Assessment & Plan Note (Signed)
Chronic. BP nl at home I increased Hyzaar dose - 100/25

## 2012-12-30 NOTE — Progress Notes (Signed)
   Subjective:     HPI C/o cough sx's x 4 wks.  Yellow phlegm, worse at night F/u on asthma - worse F/u LBP - - it is better  C/o mild L neck pain - better  R shoulder pain is not better  BP nl at home  Wt Readings from Last 3 Encounters:  12/30/12 188 lb (85.276 kg)  11/24/12 184 lb 3.2 oz (83.553 kg)  11/23/12 181 lb 12.8 oz (82.464 kg)   BP Readings from Last 3 Encounters:  12/30/12 184/88  11/24/12 170/90  11/23/12 190/90      Review of Systems  Constitutional: Negative for activity change, appetite change, fatigue and unexpected weight change.  HENT: Negative for mouth sores.   Eyes: Negative for visual disturbance.  Respiratory: Negative for chest tightness.   Gastrointestinal: Negative for nausea.  Genitourinary: Negative for frequency, difficulty urinating and vaginal pain.  Musculoskeletal: Positive for gait problem.  Skin: Negative for pallor.  Neurological: Negative for dizziness and tremors.  Psychiatric/Behavioral: Negative for confusion and sleep disturbance.       Objective:   Physical Exam  Constitutional: She appears well-developed and well-nourished. No distress.  HENT:  Head: Normocephalic.  Right Ear: External ear normal.  Left Ear: External ear normal.  Nose: Nose normal.  Mouth/Throat: Oropharynx is clear and moist.  eryth throat  Eyes: Conjunctivae are normal. Pupils are equal, round, and reactive to light. Right eye exhibits no discharge. Left eye exhibits no discharge.  Neck: Normal range of motion. Neck supple. No JVD present. No tracheal deviation present. No thyromegaly present.  Cardiovascular: Normal rate, regular rhythm and normal heart sounds.   Pulmonary/Chest: No stridor. No respiratory distress. She has no wheezes.  Abdominal: Soft. Bowel sounds are normal. She exhibits no distension and no mass. There is no tenderness. There is no rebound and no guarding.  Musculoskeletal: She exhibits tenderness (LS, L buttock is tender). She  exhibits no edema.  Lymphadenopathy:    She has no cervical adenopathy.  Neurological: She displays normal reflexes. No cranial nerve deficit. She exhibits normal muscle tone. Coordination normal.  Skin: No rash noted. No erythema.  Psychiatric: Her behavior is normal. Judgment and thought content normal.  subdued  strait leg elev is neg B hoarse   Lab Results  Component Value Date   WBC 9.7 11/23/2012   HGB 13.4 11/23/2012   HCT 42.1 11/23/2012   PLT 235.0 11/12/2010   GLUCOSE 88 06/05/2011   CHOL 202* 11/12/2010   TRIG 102.0 11/12/2010   HDL 45.80 11/12/2010   LDLDIRECT 140.2 11/12/2010   LDLCALC 89 01/09/2009   ALT 19 06/05/2011   AST 22 06/05/2011   NA 140 06/05/2011   K 3.6 06/05/2011   CL 101 06/05/2011   CREATININE 0.9 06/05/2011   BUN 11 06/05/2011   CO2 30 06/05/2011   TSH 1.74 06/05/2011   HGBA1C 7.2 09/16/2012          Assessment & Plan:

## 2012-12-30 NOTE — Assessment & Plan Note (Signed)
Continue with current prescription therapy as reflected on the Med list.  

## 2013-01-11 ENCOUNTER — Telehealth: Payer: Self-pay | Admitting: Internal Medicine

## 2013-01-11 NOTE — Telephone Encounter (Signed)
OK w/me Thx 

## 2013-01-11 NOTE — Telephone Encounter (Signed)
Per patients daughter patient would like to switch from Dr. Posey Rea to Dr. Felicity Coyer, please advise

## 2013-01-11 NOTE — Telephone Encounter (Signed)
Ok with me if ok with AVP

## 2013-01-12 NOTE — Telephone Encounter (Signed)
Please schedule in my next open new patient appointment. Thanks

## 2013-01-17 NOTE — Telephone Encounter (Signed)
Left message for patient to call back and schedule with Dr. Felicity Coyer

## 2013-01-19 NOTE — Telephone Encounter (Signed)
Left patient another message to call back and schedule

## 2013-02-10 ENCOUNTER — Ambulatory Visit: Payer: Medicare Other | Admitting: Internal Medicine

## 2013-02-21 ENCOUNTER — Ambulatory Visit (INDEPENDENT_AMBULATORY_CARE_PROVIDER_SITE_OTHER): Payer: Medicare Other | Admitting: Internal Medicine

## 2013-02-21 ENCOUNTER — Encounter: Payer: Self-pay | Admitting: Internal Medicine

## 2013-02-21 VITALS — BP 152/90 | HR 49 | Temp 98.3°F | Ht 63.0 in | Wt 190.4 lb

## 2013-02-21 DIAGNOSIS — M76899 Other specified enthesopathies of unspecified lower limb, excluding foot: Secondary | ICD-10-CM | POA: Diagnosis not present

## 2013-02-21 DIAGNOSIS — E119 Type 2 diabetes mellitus without complications: Secondary | ICD-10-CM

## 2013-02-21 DIAGNOSIS — J45901 Unspecified asthma with (acute) exacerbation: Secondary | ICD-10-CM | POA: Diagnosis not present

## 2013-02-21 DIAGNOSIS — R5381 Other malaise: Secondary | ICD-10-CM

## 2013-02-21 DIAGNOSIS — R5383 Other fatigue: Secondary | ICD-10-CM

## 2013-02-21 DIAGNOSIS — M7062 Trochanteric bursitis, left hip: Secondary | ICD-10-CM

## 2013-02-21 MED ORDER — PREDNISONE (PAK) 10 MG PO TABS: 10.0000 mg | ORAL_TABLET | ORAL | Status: DC

## 2013-02-21 NOTE — Assessment & Plan Note (Signed)
Chronic symptoms, acute seasonal flare Treat with pred taper and continue Pulmicort - pt prefers this to Advair Alb MDI prn follow up pulm as needed

## 2013-02-21 NOTE — Assessment & Plan Note (Signed)
Lab Results  Component Value Date   HGBA1C 7.2 09/16/2012   The current medical regimen is effective;  continue present plan and medications.  The patient is asked to make an attempt to improve diet and exercise patterns to aid in medical management of this problem.
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
## 2013-02-21 NOTE — Patient Instructions (Signed)
It was good to see you today. We have reviewed your prior records including labs and tests today Test(s) ordered today - return in AM for these tests when fasting. Your results will be released to MyChart (or called to you) after review, usually within 72hours after test completion. If any changes need to be made, you will be notified at that same time. Injection of your greater trochanteric bursa for hip pain today - place ice over the injection site for 5 minutes every 4-6 hours for next 24h, then as needed. Do stretches as listed below and call if increasing pain or other problems Please schedule followup in 3-4 months for diabetes mellitus check, call sooner if problems. Trochanteric Bursitis You have hip pain due to trochanteric bursitis. Bursitis means that the sack near the outside of the hip is filled with fluid and inflamed. This sack is made up of protective soft tissue. The pain from trochanteric bursitis can be severe and keep you from sleep. It can radiate to the buttocks or down the outside of the thigh to the knee. The pain is almost always worse when rising from the seated or lying position and with walking. Pain can improve after you take a few steps. It happens more often in people with hip joint and lumbar spine problems, such as arthritis or previous surgery. Very rarely the trochanteric bursa can become infected, and antibiotics and/or surgery may be needed. Treatment often includes an injection of local anesthetic mixed with cortisone medicine. This medicine is injected into the area where it is most tender over the hip. Repeat injections may be necessary if the response to treatment is slow. You can apply ice packs over the tender area for 30 minutes every 2 hours for the next few days. Anti-inflammatory and/or narcotic pain medicine may also be helpful. Limit your activity for the next few days if the pain continues. See your caregiver in 5-10 days if you are not greatly improved.    SEEK IMMEDIATE MEDICAL CARE IF:  You develop severe pain, fever, or increased redness.  You have pain that radiates below the knee. EXERCISES STRETCHING EXERCISES - Trochantic Bursitis  These exercises may help you when beginning to rehabilitate your injury. Your symptoms may resolve with or without further involvement from your physician, physical therapist or athletic trainer. While completing these exercises, remember:   Restoring tissue flexibility helps normal motion to return to the joints. This allows healthier, less painful movement and activity.  An effective stretch should be held for at least 30 seconds.  A stretch should never be painful. You should only feel a gentle lengthening or release in the stretched tissue. STRETCH  Iliotibial Band  On the floor or bed, lie on your side so your injured leg is on top. Bend your knee and grab your ankle.  Slowly bring your knee back so that your thigh is in line with your trunk. Keep your heel at your buttocks and gently arch your back so your head, shoulders and hips line up.  Slowly lower your leg so that your knee approaches the floor/bed until you feel a gentle stretch on the outside of your thigh. If you do not feel a stretch and your knee will not fall farther, place the heel of your opposite foot on top of your knee and pull your thigh down farther.  Hold this stretch for __________ seconds.  Repeat __________ times. Complete this exercise __________ times per day. STRETCH Hamstrings, Supine   Lie on  your back. Loop a belt or towel over the ball of your foot as shown.  Straighten your knee and slowly pull on the belt to raise your injured leg. Do not allow the knee to bend. Keep your opposite leg flat on the floor.  Raise the leg until you feel a gentle stretch behind your knee or thigh. Hold this position for __________ seconds.  Repeat __________ times. Complete this stretch __________ times per day. STRETCH -  Quadriceps, Prone   Lie on your stomach on a firm surface, such as a bed or padded floor.  Bend your knee and grasp your ankle. If you are unable to reach, your ankle or pant leg, use a belt around your foot to lengthen your reach.  Gently pull your heel toward your buttocks. Your knee should not slide out to the side. You should feel a stretch in the front of your thigh and/or knee.  Hold this position for __________ seconds.  Repeat __________ times. Complete this stretch __________ times per day. STRETCHING - Hip Flexors, Lunge Half kneel with your knee on the floor and your opposite knee bent and directly over your ankle.  Keep good posture with your head over your shoulders. Tighten your buttocks to point your tailbone downward; this will prevent your back from arching too much.  You should feel a gentle stretch in the front of your thigh and/or hip. If you do not feel any resistance, slightly slide your opposite foot forward and then slowly lunge forward so your knee once again lines up over your ankle. Be sure your tailbone remains pointed downward.  Hold this stretch for __________ seconds.  Repeat __________ times. Complete this stretch __________ times per day. STRETCH - Adductors, Lunge  While standing, spread your legs  Lean away from your injured leg by bending your opposite knee. You may rest your hands on your thigh for balance.  You should feel a stretch in your inner thigh. Hold for __________ seconds.  Repeat __________ times. Complete this exercise __________ times per day. Document Released: 11/13/2004 Document Revised: 12/29/2011 Document Reviewed: 01/18/2009 [facility] Patient Information 2013 [place], [place].
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
## 2013-02-22 ENCOUNTER — Ambulatory Visit (INDEPENDENT_AMBULATORY_CARE_PROVIDER_SITE_OTHER): Payer: Medicare Other

## 2013-02-22 DIAGNOSIS — E119 Type 2 diabetes mellitus without complications: Secondary | ICD-10-CM

## 2013-02-22 DIAGNOSIS — R5381 Other malaise: Secondary | ICD-10-CM | POA: Diagnosis not present

## 2013-02-22 DIAGNOSIS — R5383 Other fatigue: Secondary | ICD-10-CM | POA: Diagnosis not present

## 2013-02-22 LAB — CBC WITH DIFFERENTIAL/PLATELET
Basophils Relative: 0.5 % (ref 0.0–3.0)
Eosinophils Absolute: 0.6 10*3/uL (ref 0.0–0.7)
Eosinophils Relative: 5.5 % — ABNORMAL HIGH (ref 0.0–5.0)
HCT: 42.2 % (ref 36.0–46.0)
Hemoglobin: 13.8 g/dL (ref 12.0–15.0)
MCHC: 32.7 g/dL (ref 30.0–36.0)
MCV: 85.3 fl (ref 78.0–100.0)
Monocytes Absolute: 0.6 10*3/uL (ref 0.1–1.0)
Neutro Abs: 8.1 10*3/uL — ABNORMAL HIGH (ref 1.4–7.7)
RBC: 4.95 Mil/uL (ref 3.87–5.11)
WBC: 11.7 10*3/uL — ABNORMAL HIGH (ref 4.5–10.5)

## 2013-02-22 LAB — LIPID PANEL
HDL: 74 mg/dL (ref >39.00)
Total CHOL/HDL Ratio: 3
Triglycerides: 35 mg/dL (ref 0.0–149.0)

## 2013-02-22 LAB — TSH: TSH: 1.77 u[IU]/mL (ref 0.35–5.50)

## 2013-02-22 LAB — BASIC METABOLIC PANEL
CO2: 32 mEq/L (ref 19–32)
Chloride: 101 mEq/L (ref 96–112)
Potassium: 3.3 mEq/L — ABNORMAL LOW (ref 3.5–5.1)
Sodium: 140 mEq/L (ref 135–145)

## 2013-02-22 LAB — HEPATIC FUNCTION PANEL
AST: 17 U/L (ref 0–37)
Albumin: 4 g/dL (ref 3.5–5.2)

## 2013-04-12 ENCOUNTER — Encounter: Payer: Self-pay | Admitting: Internal Medicine

## 2013-04-12 ENCOUNTER — Ambulatory Visit (INDEPENDENT_AMBULATORY_CARE_PROVIDER_SITE_OTHER): Payer: Medicare Other | Admitting: Internal Medicine

## 2013-04-12 VITALS — BP 148/82 | HR 60 | Temp 98.6°F

## 2013-04-12 DIAGNOSIS — J45901 Unspecified asthma with (acute) exacerbation: Secondary | ICD-10-CM

## 2013-04-12 DIAGNOSIS — J209 Acute bronchitis, unspecified: Secondary | ICD-10-CM | POA: Diagnosis not present

## 2013-04-12 DIAGNOSIS — J4521 Mild intermittent asthma with (acute) exacerbation: Secondary | ICD-10-CM

## 2013-04-12 MED ORDER — AZITHROMYCIN 250 MG PO TABS: ORAL_TABLET | ORAL | Status: DC

## 2013-04-12 MED ORDER — METHYLPREDNISOLONE ACETATE 80 MG/ML IJ SUSP
80.0000 mg | Freq: Once | INTRAMUSCULAR | Status: AC
  Administered 2013-04-12: 80 mg via INTRAMUSCULAR

## 2013-04-12 NOTE — Patient Instructions (Signed)

## 2013-04-12 NOTE — Addendum Note (Signed)
Addended by: Deatra James on: 04/12/2013 11:26 AM   Modules accepted: Orders
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
## 2013-05-24 ENCOUNTER — Ambulatory Visit (INDEPENDENT_AMBULATORY_CARE_PROVIDER_SITE_OTHER): Payer: Medicare Other | Admitting: Internal Medicine

## 2013-05-24 ENCOUNTER — Encounter: Payer: Self-pay | Admitting: Internal Medicine

## 2013-05-24 ENCOUNTER — Other Ambulatory Visit (INDEPENDENT_AMBULATORY_CARE_PROVIDER_SITE_OTHER): Payer: Medicare Other

## 2013-05-24 VITALS — BP 158/80 | HR 48 | Temp 97.7°F | Wt 188.1 lb

## 2013-05-24 DIAGNOSIS — E119 Type 2 diabetes mellitus without complications: Secondary | ICD-10-CM

## 2013-05-24 DIAGNOSIS — M940 Chondrocostal junction syndrome [Tietze]: Secondary | ICD-10-CM

## 2013-05-24 DIAGNOSIS — I10 Essential (primary) hypertension: Secondary | ICD-10-CM

## 2013-05-24 LAB — MICROALBUMIN / CREATININE URINE RATIO
Creatinine,U: 293.3 mg/dL
Microalb Creat Ratio: 0.5 mg/g (ref 0.0–30.0)

## 2013-05-24 LAB — HEMOGLOBIN A1C: Hgb A1c MFr Bld: 9.2 % — ABNORMAL HIGH (ref 4.6–6.5)

## 2013-05-24 MED ORDER — AMLODIPINE BESYLATE 5 MG PO TABS: 5.0000 mg | ORAL_TABLET | Freq: Every day | ORAL | Status: DC

## 2013-05-24 MED ORDER — MELOXICAM 15 MG PO TABS: 15.0000 mg | ORAL_TABLET | Freq: Every day | ORAL | Status: DC
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
## 2013-05-24 NOTE — Assessment & Plan Note (Signed)
BP Readings from Last 3 Encounters:  05/24/13 158/80  04/12/13 148/82  02/21/13 152/90   subop control, chronic Reports well controlled at home Increase amlodipine to 5 mg daily - new erx done continue max ARB/hct Avoid BB due to bradycardia (but asymptomatic presently)

## 2013-05-24 NOTE — Patient Instructions (Addendum)
It was good to see you today. We have reviewed your prior records including labs and tests today Test(s) ordered today. Your results will be released to MyChart (or called to you) after review, usually within 72hours after test completion. If any changes need to be made, you will be notified at that same time. Medications reviewed and updated: increase amlodipine to whole tablet (5 mg) every day for blood pressure control and use meloxicam one tablet daily for next 7 days to help left rib cage pain -no other changes recommended at this time. Your prescription(s) have been submitted to your pharmacy. Please take as directed and contact our office if you believe you are having problem(s) with the medication(s). Please schedule followup in 3-4 months for DM check, call sooner if problems.Costochondritis Costochondritis (Tietze syndrome), or costochondral separation, is a swelling and irritation (inflammation) of the tissue (cartilage) that connects your ribs with your breastbone (sternum). It may occur on its own (spontaneously), through damage caused by an accident (trauma), or simply from coughing or minor exercise. It may take up to 6 weeks to get better and longer if you are unable to be conservative in your activities. HOME CARE INSTRUCTIONS   Avoid exhausting physical activity. Try not to strain your ribs during normal activity. This would include any activities using chest, belly (abdominal), and side muscles, especially if heavy weights are used.  Use ice for 15-20 minutes per hour while awake for the first 2 days. Place the ice in a plastic bag, and place a towel between the bag of ice and your skin.  Only take over-the-counter or prescription medicines for pain, discomfort, or fever as directed by your caregiver. SEEK IMMEDIATE MEDICAL CARE IF:   Your pain increases or you are very uncomfortable.  You have a fever.  You develop difficulty with your breathing.  You cough up blood.  You  develop worse chest pains, shortness of breath, sweating, or vomiting.  You develop new, unexplained problems (symptoms). MAKE SURE YOU:   Understand these instructions.  Will watch your condition.  Will get help right away if you are not doing well or get worse. Document Released: 07/16/2005 Document Revised: 12/29/2011 Document Reviewed: 05/24/2008 [facility] Patient Information 2014 [place], [place].

## 2013-05-24 NOTE — Assessment & Plan Note (Signed)
Lab Results  Component Value Date   HGBA1C 7.2 09/16/2012   The current medical regimen is effective;  continue present plan and medications.  The patient is asked to make an attempt to improve diet and exercise patterns to aid in medical management of this problem.

## 2013-05-25 MED ORDER — GLIPIZIDE-METFORMIN HCL 5-500 MG PO TABS: 1.0000 | ORAL_TABLET | Freq: Two times a day (BID) | ORAL | Status: DC

## 2013-05-25 NOTE — Addendum Note (Signed)
Addended by: Rene Paci A on: 05/25/2013 09:54 AM   Modules accepted: Orders, Medications

## 2013-08-23 ENCOUNTER — Other Ambulatory Visit: Payer: Self-pay | Admitting: Internal Medicine

## 2013-08-23 DIAGNOSIS — Z1231 Encounter for screening mammogram for malignant neoplasm of breast: Secondary | ICD-10-CM

## 2013-08-24 ENCOUNTER — Encounter: Payer: Self-pay | Admitting: Internal Medicine

## 2013-08-24 ENCOUNTER — Ambulatory Visit (INDEPENDENT_AMBULATORY_CARE_PROVIDER_SITE_OTHER): Payer: Medicare Other | Admitting: Internal Medicine

## 2013-08-24 ENCOUNTER — Other Ambulatory Visit (INDEPENDENT_AMBULATORY_CARE_PROVIDER_SITE_OTHER): Payer: Medicare Other

## 2013-08-24 ENCOUNTER — Ambulatory Visit (INDEPENDENT_AMBULATORY_CARE_PROVIDER_SITE_OTHER)
Admission: RE | Admit: 2013-08-24 | Discharge: 2013-08-24 | Disposition: A | Discharge status: Home / Self Care | Payer: Medicare Other | Source: Ambulatory Visit | Attending: Internal Medicine | Admitting: Internal Medicine

## 2013-08-24 VITALS — BP 142/74 | HR 55 | Temp 99.4°F | Wt 193.0 lb

## 2013-08-24 DIAGNOSIS — I10 Essential (primary) hypertension: Secondary | ICD-10-CM

## 2013-08-24 DIAGNOSIS — R05 Cough: Secondary | ICD-10-CM | POA: Diagnosis not present

## 2013-08-24 DIAGNOSIS — R0781 Pleurodynia: Secondary | ICD-10-CM

## 2013-08-24 DIAGNOSIS — R079 Chest pain, unspecified: Secondary | ICD-10-CM

## 2013-08-24 DIAGNOSIS — R0602 Shortness of breath: Secondary | ICD-10-CM | POA: Diagnosis not present

## 2013-08-24 DIAGNOSIS — E119 Type 2 diabetes mellitus without complications: Secondary | ICD-10-CM

## 2013-08-24 LAB — BASIC METABOLIC PANEL
CO2: 34 mEq/L — ABNORMAL HIGH (ref 19–32)
Chloride: 95 mEq/L — ABNORMAL LOW (ref 96–112)
Potassium: 3.1 mEq/L — ABNORMAL LOW (ref 3.5–5.1)

## 2013-08-24 MED ORDER — GLIPIZIDE-METFORMIN HCL 5-500 MG PO TABS: 1.0000 | ORAL_TABLET | Freq: Every day | ORAL | Status: DC

## 2013-08-24 NOTE — Assessment & Plan Note (Signed)
BP Readings from Last 3 Encounters:  08/24/13 142/74  05/24/13 158/80  04/12/13 148/82   subop control, chronic Reports well controlled at home Increased amlodipine to 5 mg daily 05/2013 continue max ARB/hct Avoid BB due to bradycardia (but asymptomatic presently)

## 2013-08-24 NOTE — Patient Instructions (Signed)
It was good to see you today.  We have reviewed your prior records including labs and tests today  Test(s) ordered today -lab and xray. Your results will be released to MyChart (or called to you) after review, usually within 72hours after test completion. If any changes need to be made, you will be notified at that same time.  Medications reviewed and updated: reduce Metaglip to once daily -no other changes recommended at this time.  Please schedule followup in 3-4 months for DM check, call sooner if problems.

## 2013-08-24 NOTE — Progress Notes (Signed)
Pre-visit discussion using our clinic review tool. No additional management support is needed unless otherwise documented below in the visit note.  
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
## 2013-08-24 NOTE — Assessment & Plan Note (Signed)
Lab Results  Component Value Date   HGBA1C 9.2* 05/24/2013   metaglip increased to twice daily August 2014 when A1c not goal ?recent hypoglycemic symptoms reviewed, currently taking only once daily Check A1c now, consider Januvia if alternate therapy needed The patient is asked to make an attempt to improve diet and exercise patterns to aid in medical management of this problem.

## 2013-09-08 ENCOUNTER — Ambulatory Visit (HOSPITAL_COMMUNITY)
Admission: RE | Admit: 2013-09-08 | Discharge: 2013-09-08 | Disposition: A | Discharge status: Home / Self Care | Payer: Medicare Other | Source: Ambulatory Visit | Attending: Internal Medicine | Admitting: Internal Medicine

## 2013-09-08 DIAGNOSIS — Z1231 Encounter for screening mammogram for malignant neoplasm of breast: Secondary | ICD-10-CM | POA: Diagnosis not present

## 2013-09-19 ENCOUNTER — Ambulatory Visit (INDEPENDENT_AMBULATORY_CARE_PROVIDER_SITE_OTHER)
Admission: RE | Admit: 2013-09-19 | Discharge: 2013-09-19 | Disposition: A | Discharge status: Home / Self Care | Payer: Medicare Other | Source: Ambulatory Visit | Attending: Internal Medicine | Admitting: Internal Medicine

## 2013-09-19 ENCOUNTER — Encounter: Payer: Self-pay | Admitting: Internal Medicine

## 2013-09-19 ENCOUNTER — Ambulatory Visit (INDEPENDENT_AMBULATORY_CARE_PROVIDER_SITE_OTHER): Payer: Medicare Other | Admitting: Internal Medicine

## 2013-09-19 VITALS — BP 130/90 | HR 60 | Temp 98.7°F | Resp 16

## 2013-09-19 DIAGNOSIS — J45909 Unspecified asthma, uncomplicated: Secondary | ICD-10-CM | POA: Diagnosis not present

## 2013-09-19 DIAGNOSIS — J45901 Unspecified asthma with (acute) exacerbation: Secondary | ICD-10-CM | POA: Diagnosis not present

## 2013-09-19 DIAGNOSIS — J209 Acute bronchitis, unspecified: Secondary | ICD-10-CM | POA: Diagnosis not present

## 2013-09-19 DIAGNOSIS — R05 Cough: Secondary | ICD-10-CM

## 2013-09-19 DIAGNOSIS — R059 Cough, unspecified: Secondary | ICD-10-CM

## 2013-09-19 MED ORDER — AZITHROMYCIN 500 MG PO TABS: 500.0000 mg | ORAL_TABLET | Freq: Every day | ORAL | Status: DC

## 2013-09-19 MED ORDER — METHYLPREDNISOLONE ACETATE 80 MG/ML IJ SUSP
120.0000 mg | Freq: Once | INTRAMUSCULAR | Status: AC
  Administered 2013-09-19: 120 mg via INTRAMUSCULAR

## 2013-09-19 MED ORDER — PROMETHAZINE-DM 6.25-15 MG/5ML PO SYRP: 5.0000 mL | ORAL_SOLUTION | Freq: Four times a day (QID) | ORAL | Status: DC | PRN

## 2013-09-19 NOTE — Progress Notes (Signed)
Pre visit review using our clinic review tool, if applicable. No additional management support is needed unless otherwise documented below in the visit note. 

## 2013-09-19 NOTE — Assessment & Plan Note (Signed)
I will check her CXR to see if there is a pna, mass ,edema

## 2013-09-19 NOTE — Progress Notes (Signed)
   Subjective:    [name], female    DOB: 1946/06/23, 67 y.o.   MRN: [id number]  Cough This is a new problem. The current episode started 1 to 4 weeks ago. The problem has been gradually worsening. The problem occurs every few hours. The cough is productive of purulent sputum. Associated symptoms include shortness of breath and wheezing. Pertinent negatives include no chest pain, chills, ear congestion, ear pain, fever, headaches, heartburn, hemoptysis, myalgias, nasal congestion, postnasal drip, rash, rhinorrhea, sore throat, sweats or weight loss. She has tried OTC cough suppressant for the symptoms. The treatment provided mild relief. Her past medical history is significant for asthma. There is no history of bronchiectasis, bronchitis, COPD, emphysema, environmental allergies or pneumonia.      Review of Systems  Constitutional: Negative.  Negative for fever, chills, weight loss, diaphoresis, appetite change, fatigue and unexpected weight change.  HENT: Negative.  Negative for ear pain, postnasal drip, rhinorrhea, sinus pressure, sore throat and trouble swallowing.   Eyes: Negative.   Respiratory: Positive for cough, shortness of breath and wheezing. Negative for apnea, hemoptysis, choking, chest tightness and stridor.   Cardiovascular: Negative.  Negative for chest pain, palpitations and leg swelling.  Gastrointestinal: Negative.  Negative for heartburn.  Endocrine: Negative.   Genitourinary: Negative.   Musculoskeletal: Negative.  Negative for myalgias.  Skin: Negative for rash.  Allergic/Immunologic: Negative.  Negative for environmental allergies.  Neurological: Negative.  Negative for dizziness, tremors, speech difficulty, weakness, light-headedness and headaches.  Hematological: Negative.  Negative for adenopathy. Does not bruise/bleed easily.  Psychiatric/Behavioral: Negative.        Objective:   Physical Exam  Vitals reviewed. Constitutional: She is oriented  to person, place, and time. She appears well-developed and well-nourished.  Non-toxic appearance. She does not have a sickly appearance. She does not appear ill. No distress.  HENT:  Head: Normocephalic and atraumatic.  Mouth/Throat: Oropharynx is clear and moist. No oropharyngeal exudate.  Eyes: Conjunctivae are normal. Right eye exhibits no discharge. Left eye exhibits no discharge. No scleral icterus.  Neck: Normal range of motion. Neck supple. No JVD present. No tracheal deviation present. No thyromegaly present.  Cardiovascular: Normal rate, regular rhythm, normal heart sounds and intact distal pulses.  Exam reveals no gallop and no friction rub.   No murmur heard. Pulmonary/Chest: Effort normal. No accessory muscle usage or stridor. Not tachypneic. No respiratory distress. She has no decreased breath sounds. She has wheezes in the right middle field and the left middle field. She has rhonchi in the right middle field and the left middle field. She has no rales. She exhibits no tenderness.  Abdominal: Soft. Bowel sounds are normal. She exhibits no distension and no mass. There is no tenderness. There is no rebound and no guarding.  Musculoskeletal: Normal range of motion. She exhibits no edema and no tenderness.  Lymphadenopathy:    She has no cervical adenopathy.  Neurological: She is oriented to person, place, and time.  Skin: Skin is warm and dry. No rash noted. She is not diaphoretic. No erythema. No pallor.  Psychiatric: She has a normal mood and affect. Her behavior is normal. Judgment and thought content normal.          Assessment & Plan:

## 2013-09-19 NOTE — Patient Instructions (Signed)
Asthma, Adult Asthma is a recurring condition in which the airways tighten and narrow. Asthma can make it difficult to breathe. It can cause coughing, wheezing, and shortness of breath. Asthma episodes (also called asthma attacks) range from minor to life-threatening. Asthma cannot be cured, but medicines and lifestyle changes can help control it. CAUSES Asthma is believed to be caused by inherited (genetic) and environmental factors, but its exact cause is unknown. Asthma may be triggered by allergens, lung infections, or irritants in the air. Asthma triggers are different for each person. Common triggers include:   Animal dander.  Dust mites.  Cockroaches.  Pollen from trees or grass.  Mold.  Smoke.  Air pollutants such as dust, household cleaners, hair sprays, aerosol sprays, paint fumes, strong chemicals, or strong odors.  Cold air, weather changes, and winds (which increase molds and pollens in the air).  Strong emotional expressions such as crying or laughing hard.  Stress.  Certain medicines (such as aspirin) or types of drugs (such as beta-blockers).  Sulfites in foods and drinks. Foods and drinks that may contain sulfites include dried fruit, potato chips, and sparkling grape juice.  Infections or inflammatory conditions such as the flu, a cold, or an inflammation of the nasal membranes (rhinitis).  Gastroesophageal reflux disease (GERD).  Exercise or strenuous activity. SYMPTOMS Symptoms may occur immediately after asthma is triggered or many hours later. Symptoms include:  Wheezing.  Excessive nighttime or early morning coughing.  Frequent or severe coughing with a common cold.  Chest tightness.  Shortness of breath. DIAGNOSIS  The diagnosis of asthma is made by a review of your medical history and a physical exam. Tests may also be performed. These may include:  Lung function studies. These tests show how much air you breath in and out.  Allergy  tests.  Imaging tests such as X-rays. TREATMENT  Asthma cannot be cured, but it can usually be controlled. Treatment involves identifying and avoiding your asthma triggers. It also involves medicines. There are 2 classes of medicine used for asthma treatment:   Controller medicines. These prevent asthma symptoms from occurring. They are usually taken every day.  Reliever or rescue medicines. These quickly relieve asthma symptoms. They are used as needed and provide short-term relief. Your health care provider will help you create an asthma action plan. An asthma action plan is a written plan for managing and treating your asthma attacks. It includes a list of your asthma triggers and how they may be avoided. It also includes information on when medicines should be taken and when their dosage should be changed. An action plan may also involve the use of a device called a peak flow meter. A peak flow meter measures how well the lungs are working. It helps you monitor your condition. HOME CARE INSTRUCTIONS   Take medicine as directed by your health care provider. Speak with your health care provider if you have questions about how or when to take the medicines.  Use a peak flow meter as directed by your health care provider. Record and keep track of readings.  Understand and use the action plan to help minimize or stop an asthma attack without needing to seek medical care.  Control your home environment in the following ways to help prevent asthma attacks:  Do not smoke. Avoid being exposed to secondhand smoke.  Change your heating and air conditioning filter regularly.  Limit your use of fireplaces and wood stoves.  Get rid of pests (such as roaches and   mice) and their droppings.  Throw away plants if you see mold on them.  Clean your floors and dust regularly. Use unscented cleaning products.  Try to have someone else vacuum for you regularly. Stay out of rooms while they are being  vacuumed and for a short while afterward. If you vacuum, use a dust mask from a hardware store, a double-layered or microfilter vacuum cleaner bag, or a vacuum cleaner with a HEPA filter.  Replace carpet with wood, tile, or vinyl flooring. Carpet can trap dander and dust.  Use allergy-proof pillows, mattress covers, and box spring covers.  Wash bed sheets and blankets every week in hot water and dry them in a dryer.  Use blankets that are made of polyester or cotton.  Clean bathrooms and kitchens with bleach. If possible, have someone repaint the walls in these rooms with mold-resistant paint. Keep out of the rooms that are being cleaned and painted.  Wash hands frequently. SEEK MEDICAL CARE IF:   You have wheezing, shortness of breath, or a cough even if taking medicine to prevent attacks.  The colored mucus you cough up (sputum) is thicker than usual.  Your sputum changes from clear or white to yellow, green, gray, or bloody.  You have any problems that may be related to the medicines you are taking (such as a rash, itching, swelling, or trouble breathing).  You are using a reliever medicine more than 2 3 times per week.  Your peak flow is still at 50 79% of you personal best after following your action plan for 1 hour. SEEK IMMEDIATE MEDICAL CARE IF:   You seem to be getting worse and are unresponsive to treatment during an asthma attack.  You are short of breath even at rest.  You get short of breath when doing very little physical activity.  You have difficulty eating, drinking, or talking due to asthma symptoms.  You develop chest pain.  You develop a fast heartbeat.  You have a bluish color to your lips or fingernails.  You are lightheaded, dizzy, or faint.  Your peak flow is less than 50% of your personal best.  You have a fever or persistent symptoms for more than 2 3 days.  You have a fever and symptoms suddenly get worse. MAKE SURE YOU:   Understand these  instructions.  Will watch your condition.  Will get help right away if you are not doing well or get worse. Document Released: 10/06/2005 Document Revised: 06/08/2013 Document Reviewed: 05/05/2013 [employer] Patient Information 2014 [employer].  

## 2013-09-19 NOTE — Assessment & Plan Note (Signed)
I will treat the infection with zpak and will control the cough with phenergan-dm 

## 2013-09-19 NOTE — Assessment & Plan Note (Signed)
She is having an exacerbation of of her symptoms so I gave her an injection of depo-medrol IM

## 2013-09-19 NOTE — Addendum Note (Signed)
Addended by: Rock Nephew T on: 09/19/2013 02:10 PM   Modules accepted: Orders

## 2013-09-20 NOTE — Progress Notes (Signed)
Left message for pt to return call.

## 2013-11-24 ENCOUNTER — Encounter: Payer: Self-pay | Admitting: Internal Medicine

## 2013-11-24 ENCOUNTER — Ambulatory Visit (INDEPENDENT_AMBULATORY_CARE_PROVIDER_SITE_OTHER): Payer: Medicare Other | Admitting: Internal Medicine

## 2013-11-24 ENCOUNTER — Other Ambulatory Visit (INDEPENDENT_AMBULATORY_CARE_PROVIDER_SITE_OTHER): Payer: Medicare Other

## 2013-11-24 VITALS — BP 130/84 | HR 51 | Temp 99.3°F | Wt 192.4 lb

## 2013-11-24 DIAGNOSIS — M549 Dorsalgia, unspecified: Secondary | ICD-10-CM

## 2013-11-24 DIAGNOSIS — I10 Essential (primary) hypertension: Secondary | ICD-10-CM | POA: Diagnosis not present

## 2013-11-24 DIAGNOSIS — E119 Type 2 diabetes mellitus without complications: Secondary | ICD-10-CM

## 2013-11-24 LAB — HEMOGLOBIN A1C: Hgb A1c MFr Bld: 9 % — ABNORMAL HIGH (ref 4.6–6.5)

## 2013-11-24 MED ORDER — TRAMADOL HCL 50 MG PO TABS: 50.0000 mg | ORAL_TABLET | Freq: Two times a day (BID) | ORAL | Status: DC | PRN

## 2013-11-24 MED ORDER — CYCLOBENZAPRINE HCL 5 MG PO TABS: 5.0000 mg | ORAL_TABLET | Freq: Two times a day (BID) | ORAL | Status: DC | PRN

## 2013-11-24 NOTE — Assessment & Plan Note (Signed)
Chronic and recurrent spasm flares No neuro deficits on exam Resume flexeril and tramadol prn Continue brace prn and call if worse or unimproved
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
## 2013-11-24 NOTE — Assessment & Plan Note (Signed)
BP Readings from Last 3 Encounters:  11/24/13 130/84  09/19/13 130/90  08/24/13 142/74   subop control, chronic Reports well controlled at home Increased amlodipine to 5 mg daily 05/2013 continue max ARB/hct Avoid BB due to bradycardia (but asymptomatic presently)

## 2013-11-24 NOTE — Progress Notes (Signed)
Pre-visit discussion using our clinic review tool. No additional management support is needed unless otherwise documented below in the visit note.  

## 2013-11-24 NOTE — Assessment & Plan Note (Signed)
Lab Results  Component Value Date   HGBA1C 9.2* 05/24/2013   metaglip increased to twice daily August 2014 when A1c not goal, but noncompliant with same Check A1c now, consider Januvia or alternate therapy needed The patient is asked to make an attempt to improve diet and exercise patterns to aid in medical management of this problem.

## 2013-11-24 NOTE — Patient Instructions (Addendum)
It was good to see you today.  We have reviewed your prior records including labs and tests today  Test(s) ordered today -labs. Your results will be released to [place] (or called to you) after review, usually within 72hours after test completion. If any changes need to be made, you will be notified at that same time.  Medications reviewed and updated: will consider replacement for diabetes mellitus pill Metaglip depending on lab results  Also use Flexeril as needed for muscle spasm and tramadol as needed for back pain no other changes recommended at this time.  Your prescription(s) have been submitted to your pharmacy. Please take as directed and contact our office if you believe you are having problem(s) with the medication(s).  Please schedule followup in 3-4 months for DM check, call sooner if problems.  Diabetes and Standards of Medical Care  Diabetes is complicated. You may find that your diabetes team includes a dietitian, nurse, diabetes educator, eye doctor, and more. To help everyone know what is going on and to help you get the care you deserve, the following schedule of care was developed to help keep you on track. Below are the tests, exams, vaccines, medicines, education, and plans you will need. HbA1c test This test shows how well you have controlled your glucose over the past 2 3 months. It is used to see if your diabetes management plan needs to be adjusted.   It is performed at least 2 times a year if you are meeting treatment goals.  It is performed 4 times a year if therapy has changed or if you are not meeting treatment goals. Blood pressure test  This test is performed at every routine medical visit. The goal is less than 140/90 mmHg for most people, but 130/80 mmHg in some cases. Ask your health care provider about your goal. Dental exam  Follow up with the dentist regularly. Eye exam  If you are diagnosed with type 1 diabetes as a child, get an exam upon  reaching the age of 74 years or older and have had diabetes for 3 5 years. Yearly eye exams are recommended after that initial eye exam.  If you are diagnosed with type 1 diabetes as an adult, get an exam within 5 years of diagnosis and then yearly.  If you are diagnosed with type 2 diabetes, get an exam as soon as possible after the diagnosis and then yearly. Foot care exam  Visual foot exams are performed at every routine medical visit. The exams check for cuts, injuries, or other problems with the feet.  A comprehensive foot exam should be done yearly. This includes visual inspection as well as assessing foot pulses and testing for loss of sensation.  Check your feet nightly for cuts, injuries, or other problems with your feet. Tell your health care provider if anything is not healing. Kidney function test (urine microalbumin)  This test is performed once a year.  Type 1 diabetes: The first test is performed 5 years after diagnosis.  Type 2 diabetes: The first test is performed at the time of diagnosis.  A serum creatinine and estimated glomerular filtration rate (eGFR) test is done once a year to assess the level of chronic kidney disease (CKD), if present. Lipid profile (cholesterol, HDL, LDL, triglycerides)  Performed every 5 years for most people.  The goal for LDL is less than 100 mg/dL. If you are at high risk, the goal is less than 70 mg/dL.  The goal for HDL is  40 mg/dL 50 mg/dL for men and 50 mg/dL 60 mg/dL for women. An HDL cholesterol of 60 mg/dL or higher gives some protection against heart disease.  The goal for triglycerides is less than 150 mg/dL. Influenza vaccine, pneumococcal vaccine, and hepatitis B vaccine  The influenza vaccine is recommended yearly.  The pneumococcal vaccine is generally given once in a lifetime. However, there are some instances when another vaccination is recommended. Check with your health care provider.  The hepatitis B vaccine is also  recommended for adults with diabetes. Diabetes self-management education  Education is recommended at diagnosis and ongoing as needed. Treatment plan  Your treatment plan is reviewed at every medical visit. Document Released: 08/03/2009 Document Revised: 06/08/2013 Document Reviewed: 03/08/2013 [facility] Patient Information 2014 [place].

## 2013-11-25 MED ORDER — SITAGLIPTIN PHOSPHATE 100 MG PO TABS: 100.0000 mg | ORAL_TABLET | Freq: Every day | ORAL | Status: DC

## 2013-11-25 NOTE — Addendum Note (Signed)
Addended by: Gwendolyn Grant A on: 11/25/2013 09:10 AM   Modules accepted: Orders

## 2013-12-20 ENCOUNTER — Encounter (HOSPITAL_COMMUNITY): Payer: Self-pay | Admitting: Emergency Medicine

## 2013-12-20 ENCOUNTER — Emergency Department (HOSPITAL_COMMUNITY): Payer: No Typology Code available for payment source

## 2013-12-20 ENCOUNTER — Emergency Department (HOSPITAL_COMMUNITY)
Admission: EM | Admit: 2013-12-20 | Discharge: 2013-12-20 | Disposition: A | Discharge status: Home / Self Care | Payer: No Typology Code available for payment source | Attending: Emergency Medicine | Admitting: Emergency Medicine

## 2013-12-20 DIAGNOSIS — Z8719 Personal history of other diseases of the digestive system: Secondary | ICD-10-CM | POA: Diagnosis not present

## 2013-12-20 DIAGNOSIS — M545 Low back pain, unspecified: Secondary | ICD-10-CM

## 2013-12-20 DIAGNOSIS — J45909 Unspecified asthma, uncomplicated: Secondary | ICD-10-CM | POA: Insufficient documentation

## 2013-12-20 DIAGNOSIS — Z91040 Latex allergy status: Secondary | ICD-10-CM | POA: Diagnosis not present

## 2013-12-20 DIAGNOSIS — Z7982 Long term (current) use of aspirin: Secondary | ICD-10-CM | POA: Insufficient documentation

## 2013-12-20 DIAGNOSIS — Y92410 Unspecified street and highway as the place of occurrence of the external cause: Secondary | ICD-10-CM | POA: Insufficient documentation

## 2013-12-20 DIAGNOSIS — Z8709 Personal history of other diseases of the respiratory system: Secondary | ICD-10-CM | POA: Insufficient documentation

## 2013-12-20 DIAGNOSIS — Z791 Long term (current) use of non-steroidal anti-inflammatories (NSAID): Secondary | ICD-10-CM | POA: Diagnosis not present

## 2013-12-20 DIAGNOSIS — IMO0002 Reserved for concepts with insufficient information to code with codable children: Secondary | ICD-10-CM | POA: Diagnosis not present

## 2013-12-20 DIAGNOSIS — Z872 Personal history of diseases of the skin and subcutaneous tissue: Secondary | ICD-10-CM | POA: Diagnosis not present

## 2013-12-20 DIAGNOSIS — I10 Essential (primary) hypertension: Secondary | ICD-10-CM | POA: Diagnosis not present

## 2013-12-20 DIAGNOSIS — Z79899 Other long term (current) drug therapy: Secondary | ICD-10-CM | POA: Insufficient documentation

## 2013-12-20 DIAGNOSIS — Y9389 Activity, other specified: Secondary | ICD-10-CM | POA: Diagnosis not present

## 2013-12-20 DIAGNOSIS — Z8659 Personal history of other mental and behavioral disorders: Secondary | ICD-10-CM | POA: Insufficient documentation

## 2013-12-20 DIAGNOSIS — G8929 Other chronic pain: Secondary | ICD-10-CM | POA: Insufficient documentation

## 2013-12-20 DIAGNOSIS — E669 Obesity, unspecified: Secondary | ICD-10-CM | POA: Diagnosis not present

## 2013-12-20 DIAGNOSIS — E119 Type 2 diabetes mellitus without complications: Secondary | ICD-10-CM | POA: Insufficient documentation

## 2013-12-20 DIAGNOSIS — M549 Dorsalgia, unspecified: Secondary | ICD-10-CM | POA: Diagnosis not present

## 2013-12-20 LAB — CBG MONITORING, ED: Glucose-Capillary: 230 mg/dL — ABNORMAL HIGH (ref 70–99)

## 2013-12-20 MED ORDER — FENTANYL CITRATE 0.05 MG/ML IJ SOLN
50.0000 ug | Freq: Once | INTRAMUSCULAR | Status: AC
  Administered 2013-12-20: 50 ug via NASAL
  Filled 2013-12-20: qty 2

## 2013-12-20 MED ORDER — TRAMADOL HCL 50 MG PO TABS: 100.0000 mg | ORAL_TABLET | Freq: Four times a day (QID) | ORAL | Status: DC | PRN

## 2013-12-20 MED ORDER — METHOCARBAMOL 500 MG PO TABS: 500.0000 mg | ORAL_TABLET | Freq: Two times a day (BID) | ORAL | Status: DC

## 2013-12-20 MED ORDER — NAPROXEN 500 MG PO TABS: 500.0000 mg | ORAL_TABLET | Freq: Two times a day (BID) | ORAL | Status: DC
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
## 2013-12-21 NOTE — ED Provider Notes (Signed)
Medical screening examination/treatment/procedure(s) were performed by non-physician practitioner and as supervising physician I was immediately available for consultation/collaboration.   EKG Interpretation None        Ephraim Hamburger, MD 12/21/13 1201

## 2013-12-23 ENCOUNTER — Encounter: Payer: Self-pay | Admitting: Internal Medicine

## 2013-12-23 ENCOUNTER — Ambulatory Visit (INDEPENDENT_AMBULATORY_CARE_PROVIDER_SITE_OTHER): Payer: Medicare Other | Admitting: Internal Medicine

## 2013-12-23 VITALS — BP 160/80 | HR 58 | Temp 98.0°F | Wt 193.6 lb

## 2013-12-23 DIAGNOSIS — M545 Low back pain, unspecified: Secondary | ICD-10-CM | POA: Diagnosis not present

## 2013-12-23 DIAGNOSIS — R937 Abnormal findings on diagnostic imaging of other parts of musculoskeletal system: Secondary | ICD-10-CM

## 2013-12-23 DIAGNOSIS — G8911 Acute pain due to trauma: Secondary | ICD-10-CM | POA: Diagnosis not present
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
## 2013-12-23 NOTE — Progress Notes (Signed)
Pre visit review using our clinic review tool, if applicable. No additional management support is needed unless otherwise documented below in the visit note. 

## 2013-12-23 NOTE — Patient Instructions (Signed)
I recommend a Physical Therapy consultation to determine optimal therapy.  

## 2013-12-29 ENCOUNTER — Telehealth: Payer: Self-pay | Admitting: *Deleted

## 2013-12-29 MED ORDER — SITAGLIPTIN PHOSPHATE 100 MG PO TABS: 100.0000 mg | ORAL_TABLET | Freq: Every day | ORAL | Status: DC

## 2013-12-29 NOTE — Telephone Encounter (Signed)
[name], patient's daughter, phoned requesting that patient's Januvia script be sent to [facility] ([place] mail order pharmacy).  It was not on pharmacy profile preference, added it & erx per protocol.

## 2014-01-03 ENCOUNTER — Ambulatory Visit: Payer: Medicare Other | Admitting: Physical Therapy

## 2014-02-08 ENCOUNTER — Encounter: Payer: Self-pay | Admitting: Gynecology

## 2014-02-08 ENCOUNTER — Ambulatory Visit (INDEPENDENT_AMBULATORY_CARE_PROVIDER_SITE_OTHER): Payer: Medicare Other | Admitting: Internal Medicine

## 2014-02-08 ENCOUNTER — Ambulatory Visit (INDEPENDENT_AMBULATORY_CARE_PROVIDER_SITE_OTHER): Payer: Medicare Other | Admitting: Gynecology

## 2014-02-08 ENCOUNTER — Encounter: Payer: Self-pay | Admitting: Internal Medicine

## 2014-02-08 VITALS — BP 124/74 | Ht 64.0 in | Wt 178.0 lb

## 2014-02-08 VITALS — BP 142/80 | HR 68 | Temp 99.6°F | Wt 180.4 lb

## 2014-02-08 DIAGNOSIS — M549 Dorsalgia, unspecified: Secondary | ICD-10-CM | POA: Diagnosis not present

## 2014-02-08 DIAGNOSIS — E119 Type 2 diabetes mellitus without complications: Secondary | ICD-10-CM | POA: Diagnosis not present

## 2014-02-08 DIAGNOSIS — K649 Unspecified hemorrhoids: Secondary | ICD-10-CM | POA: Diagnosis not present

## 2014-02-08 DIAGNOSIS — N952 Postmenopausal atrophic vaginitis: Secondary | ICD-10-CM

## 2014-02-08 DIAGNOSIS — J309 Allergic rhinitis, unspecified: Secondary | ICD-10-CM | POA: Diagnosis not present

## 2014-02-08 MED ORDER — TRAMADOL HCL 50 MG PO TABS: 50.0000 mg | ORAL_TABLET | Freq: Four times a day (QID) | ORAL | Status: DC | PRN

## 2014-02-08 MED ORDER — OLOPATADINE HCL 0.1 % OP SOLN: 1.0000 [drp] | Freq: Two times a day (BID) | OPHTHALMIC | Status: DC

## 2014-02-08 NOTE — Assessment & Plan Note (Signed)
Lab Results  Component Value Date   HGBA1C 9.0* 11/24/2013   metaglip increased to twice daily August 2014 when A1c not goal, but noncompliant with same Check A1c q3-37mo and titrate as needed The patient is asked to make an attempt to improve diet and exercise patterns to aid in medical management of this problem.
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
## 2014-02-08 NOTE — Patient Instructions (Signed)
Followup for bone density as scheduled. Followup in one year for annual exam 
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
## 2014-02-08 NOTE — Assessment & Plan Note (Signed)
Seasonal symptoms, ongoing flare affecting eyes Refill on antihistamine drops provided today as requested

## 2014-02-08 NOTE — Progress Notes (Signed)
Pre visit review using our clinic review tool, if applicable. No additional management support is needed unless otherwise documented below in the visit note. 

## 2014-02-09 LAB — URINALYSIS W MICROSCOPIC + REFLEX CULTURE
BILIRUBIN URINE: NEGATIVE
Bacteria, UA: NONE SEEN
CASTS: NONE SEEN
CRYSTALS: NONE SEEN
GLUCOSE, UA: NEGATIVE mg/dL
Hgb urine dipstick: NEGATIVE
Ketones, ur: NEGATIVE mg/dL
Leukocytes, UA: NEGATIVE
Nitrite: NEGATIVE
PH: 6 (ref 5.0–8.0)
Protein, ur: NEGATIVE mg/dL
Specific Gravity, Urine: 1.025 (ref 1.005–1.030)
Urobilinogen, UA: 1 mg/dL (ref 0.0–1.0)

## 2014-02-15 ENCOUNTER — Telehealth: Payer: Self-pay | Admitting: Internal Medicine

## 2014-02-15 MED ORDER — OLOPATADINE HCL 0.1 % OP SOLN: 1.0000 [drp] | Freq: Two times a day (BID) | OPHTHALMIC | Status: DC

## 2014-02-15 NOTE — Telephone Encounter (Signed)
Called pt spoke with daughter [name]) inform her will send to med-by-mail...Johny Chess

## 2014-02-15 NOTE — Telephone Encounter (Signed)
Patient is calling to have her olopatadine (PATANOL) 0.1 % ophthalmic solution rx sent to her mail order [facility] instead of [facility]. States that she does not need the medication immediately and it is free through the mail order [place].

## 2014-02-22 ENCOUNTER — Ambulatory Visit: Payer: Medicare Other | Admitting: Internal Medicine

## 2014-02-23 ENCOUNTER — Ambulatory Visit (INDEPENDENT_AMBULATORY_CARE_PROVIDER_SITE_OTHER): Payer: Medicare Other

## 2014-02-23 ENCOUNTER — Other Ambulatory Visit: Payer: Self-pay | Admitting: Gynecology

## 2014-02-23 DIAGNOSIS — N952 Postmenopausal atrophic vaginitis: Secondary | ICD-10-CM

## 2014-02-23 DIAGNOSIS — Z780 Asymptomatic menopausal state: Secondary | ICD-10-CM | POA: Diagnosis not present

## 2014-04-20 ENCOUNTER — Other Ambulatory Visit: Payer: Self-pay | Admitting: Internal Medicine

## 2014-04-20 ENCOUNTER — Ambulatory Visit: Payer: Medicare Other

## 2014-04-20 ENCOUNTER — Ambulatory Visit (INDEPENDENT_AMBULATORY_CARE_PROVIDER_SITE_OTHER)
Admission: RE | Admit: 2014-04-20 | Discharge: 2014-04-20 | Disposition: A | Discharge status: Home / Self Care | Payer: Medicare Other | Source: Ambulatory Visit | Attending: Internal Medicine | Admitting: Internal Medicine

## 2014-04-20 ENCOUNTER — Ambulatory Visit (INDEPENDENT_AMBULATORY_CARE_PROVIDER_SITE_OTHER): Payer: Medicare Other | Admitting: Internal Medicine

## 2014-04-20 ENCOUNTER — Encounter: Payer: Self-pay | Admitting: Internal Medicine

## 2014-04-20 ENCOUNTER — Telehealth: Payer: Self-pay | Admitting: *Deleted

## 2014-04-20 VITALS — BP 152/70 | HR 56 | Temp 97.9°F | Wt 181.4 lb

## 2014-04-20 DIAGNOSIS — E1159 Type 2 diabetes mellitus with other circulatory complications: Secondary | ICD-10-CM | POA: Diagnosis not present

## 2014-04-20 DIAGNOSIS — E119 Type 2 diabetes mellitus without complications: Secondary | ICD-10-CM

## 2014-04-20 DIAGNOSIS — E785 Hyperlipidemia, unspecified: Secondary | ICD-10-CM

## 2014-04-20 DIAGNOSIS — J45901 Unspecified asthma with (acute) exacerbation: Principal | ICD-10-CM

## 2014-04-20 DIAGNOSIS — J441 Chronic obstructive pulmonary disease with (acute) exacerbation: Secondary | ICD-10-CM

## 2014-04-20 DIAGNOSIS — J45909 Unspecified asthma, uncomplicated: Secondary | ICD-10-CM | POA: Diagnosis not present

## 2014-04-20 DIAGNOSIS — E1059 Type 1 diabetes mellitus with other circulatory complications: Secondary | ICD-10-CM

## 2014-04-20 LAB — LIPID PANEL
CHOL/HDL RATIO: 4
Cholesterol: 188 mg/dL (ref 0–200)
HDL: 49.5 mg/dL (ref >39.00)
LDL Cholesterol: 117 mg/dL — ABNORMAL HIGH (ref 0–99)
NONHDL: 138.5
Triglycerides: 107 mg/dL (ref 0.0–149.0)
VLDL: 21.4 mg/dL (ref 0.0–40.0)

## 2014-04-20 LAB — CBC WITH DIFFERENTIAL/PLATELET
BASOS ABS: 0.1 10*3/uL (ref 0.0–0.1)
Basophils Relative: 1.3 % (ref 0.0–3.0)
EOS ABS: 0.6 10*3/uL (ref 0.0–0.7)
Eosinophils Relative: 7.5 % — ABNORMAL HIGH (ref 0.0–5.0)
HCT: 39.1 % (ref 36.0–46.0)
Hemoglobin: 13 g/dL (ref 12.0–15.0)
LYMPHS ABS: 2.1 10*3/uL (ref 0.7–4.0)
Lymphocytes Relative: 27.1 % (ref 12.0–46.0)
MCHC: 33.2 g/dL (ref 30.0–36.0)
MCV: 84.2 fl (ref 78.0–100.0)
Monocytes Absolute: 0.4 10*3/uL (ref 0.1–1.0)
Monocytes Relative: 5 % (ref 3.0–12.0)
NEUTROS PCT: 59.1 % (ref 43.0–77.0)
Neutro Abs: 4.6 10*3/uL (ref 1.4–7.7)
PLATELETS: 213 10*3/uL (ref 150.0–400.0)
RBC: 4.64 Mil/uL (ref 3.87–5.11)
RDW: 14.3 % (ref 11.5–15.5)
WBC: 7.8 10*3/uL (ref 4.0–10.5)

## 2014-04-20 LAB — HEMOGLOBIN A1C: HEMOGLOBIN A1C: 10.8 % — AB (ref 4.6–6.5)

## 2014-04-20 MED ORDER — AMOXICILLIN 500 MG PO CAPS: 500.0000 mg | ORAL_CAPSULE | Freq: Three times a day (TID) | ORAL | Status: DC

## 2014-04-20 MED ORDER — PREDNISONE 20 MG PO TABS: 20.0000 mg | ORAL_TABLET | Freq: Two times a day (BID) | ORAL | Status: DC

## 2014-04-20 NOTE — Telephone Encounter (Signed)
Patient coming in today for an office visit.  Lipid panel ordered.

## 2014-04-20 NOTE — Progress Notes (Signed)
   Subjective:    [name], female    DOB: 03-22-1946, 68 y.o.   MRN: [id number]  HPI   She's had respiratory symptoms since 04/14/14. Initial symptoms of sneezing followed by a dry cough. It has become productive of mucus. She treated this with allergy medicines and over-the-counter cough syrup without benefit. She's also been using her Advair as well as Pulmicort. She's also employed the hand-held nebulizer twice a day but continues to have asthma symptoms  She has a past history of asthma but has never smoked  She's a diabetic but rarely monitors her glucoses. The most recent monitor revealed fasting glucoses of 140-170. Her last A1c was 9% in February this year.    Review of Systems   She denies frontal headache, facial pain, nasal purulence, dental pain, sore throat ,otic pain, or otic discharge.   Chest pain, palpitations, tachycardia, claudication or edema are absent.        Objective:   Physical Exam   Significant or distinguishing  findings on physical exam are documented first.  Below that are other systems examined & findings.    She is markedly ticklish which impaired examination of the ears and axilla.  Chest reveals  low-grade musical rhonchi in all lung fields.  Posterior tibial pulses are slightly decreased. No ischemic changes are noted.  General appearance:good health ;well nourished; no acute distress or increased work of breathing is present.  No  lymphadenopathy about the head, neck, or axilla noted. Appears younger than stated age  Eyes: No conjunctival inflammation or lid edema is present. There is no scleral icterus.  Ears:  External ear exam shows no significant lesions or deformities.  Otoscopic examination reveals clear canals, tympanic membranes are intact bilaterally without bulging, retraction, inflammation or discharge.  Nose:  External nasal examination shows no deformity or inflammation. Nasal mucosa are pink and moist  without lesions or exudates. No septal dislocation or deviation.No obstruction to airflow.   Oral exam: Dental hygiene is good; lips and gums are healthy appearing.There is no oropharyngeal erythema or exudate noted.   Neck:  No deformities, thyromegaly, masses, or tenderness noted.   Supple with full range of motion without pain.   Heart:  Normal rate and regular rhythm. S1 and S2 normal without gallop, murmur, click, rub or other extra sounds.   Lungs:.No increased work of breathing.    Extremities:  No cyanosis, edema, or clubbing  noted    Skin: Warm & dry w/o jaundice or tenting.         Assessment & Plan:  #1 asthma exacerbation despite intensive therapy #2 DM, ? Control See orders & AVS

## 2014-04-20 NOTE — Progress Notes (Signed)
Pre visit review using our clinic review tool, if applicable. No additional management support is needed unless otherwise documented below in the visit note. 

## 2014-04-20 NOTE — Addendum Note (Signed)
Addended byUnice Cobble F on: 04/20/2014 12:46 PM   Modules accepted: Orders

## 2014-04-20 NOTE — Patient Instructions (Signed)
Goals for home glucose monitoring are : fasting  or morning glucose goal of  90-150. Two hours (from "first bite") after any meal , goal = < 180, preferably < 160.Any low blood glucoses should be reported immediately.

## 2014-04-21 ENCOUNTER — Other Ambulatory Visit: Payer: Self-pay | Admitting: Internal Medicine

## 2014-04-21 DIAGNOSIS — IMO0001 Reserved for inherently not codable concepts without codable children: Secondary | ICD-10-CM

## 2014-04-21 DIAGNOSIS — E1165 Type 2 diabetes mellitus with hyperglycemia: Principal | ICD-10-CM

## 2014-04-21 NOTE — Assessment & Plan Note (Signed)
04/20/14 A1c 10.8%

## 2014-05-05 ENCOUNTER — Ambulatory Visit: Payer: Medicare Other | Admitting: Endocrinology

## 2014-05-10 ENCOUNTER — Encounter: Payer: Self-pay | Admitting: Internal Medicine

## 2014-05-10 ENCOUNTER — Ambulatory Visit (INDEPENDENT_AMBULATORY_CARE_PROVIDER_SITE_OTHER): Payer: Medicare Other | Admitting: Internal Medicine

## 2014-05-10 VITALS — BP 140/80 | HR 53 | Temp 98.3°F | Ht 64.0 in | Wt 181.4 lb

## 2014-05-10 DIAGNOSIS — M545 Low back pain, unspecified: Secondary | ICD-10-CM | POA: Diagnosis not present

## 2014-05-10 DIAGNOSIS — J45909 Unspecified asthma, uncomplicated: Secondary | ICD-10-CM | POA: Diagnosis not present

## 2014-05-10 DIAGNOSIS — I10 Essential (primary) hypertension: Secondary | ICD-10-CM | POA: Diagnosis not present

## 2014-05-10 DIAGNOSIS — E1165 Type 2 diabetes mellitus with hyperglycemia: Principal | ICD-10-CM

## 2014-05-10 DIAGNOSIS — IMO0001 Reserved for inherently not codable concepts without codable children: Secondary | ICD-10-CM | POA: Diagnosis not present

## 2014-05-10 MED ORDER — INSULIN DETEMIR 100 UNIT/ML FLEXPEN: 20.0000 [IU] | PEN_INJECTOR | Freq: Every day | SUBCUTANEOUS | Status: DC

## 2014-05-10 NOTE — Assessment & Plan Note (Signed)
BP Readings from Last 3 Encounters:  05/10/14 140/80  04/20/14 152/70  02/08/14 142/80   subop control, chronic Reports well controlled at home Increased amlodipine to 5 mg daily 05/2013 continue max ARB/hct Avoid BB due to bradycardia (but asymptomatic presently)
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
## 2014-05-10 NOTE — Assessment & Plan Note (Signed)
Chronic symptoms with episodic seasonal flare continue Pulmicort - pt prefers this to Advair Alb MDI prn follow up pulm as needed

## 2014-05-10 NOTE — Progress Notes (Signed)
Pre visit review using our clinic review tool, if applicable. No additional management support is needed unless otherwise documented below in the visit note. 
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
## 2014-05-10 NOTE — Patient Instructions (Addendum)
It was good to see you today.  We have reviewed your prior records including labs and tests today  Medications reviewed and updated  Begin Levemir insulin injection once daily at bedtime in addition to ongoing Januvia for diabetes mellitus  No other changes recommended at this time.  Your prescription(s) have been submitted to your pharmacy. Please take as directed and contact our office if you believe you are having problem(s) with the medication(s).  follow up with Dr Dwyane Dee for additional diabetes mellitus help as scheduled 06/12/14  Please schedule followup with me in 6 months, call sooner if problems.  Insulin Resistance Blood sugar (glucose) levels are controlled by a hormone called insulin. Insulin is made by your pancreas. When your blood glucose goes up, insulin is released into your blood. Insulin is required for your body to function normally. However, your body can become resistant to your own insulin or to insulin given to treat diabetes. In either case, insulin resistance can lead to serious problems. These problems include:  Type 2 diabetes.  Heart disease.  High blood pressure.  Stroke.  Polycystic ovary syndrome.  Fatty liver. CAUSES  Insulin resistance can develop for many different reasons. It is more likely to happen in people with these conditions or characteristics:  Obesity.  Inactivity.  Pregnancy.  High blood pressure.  Stress.  Steroid use.  Infection or severe illness.  Increased levels of cholesterol and triglycerides. SYMPTOMS  There are no symptoms. You may have symptoms related to the various complications of insulin resistance.  DIAGNOSIS  Several different things can make your caregiver suspect you have insulin resistance. These include:  High blood glucose (hyperglycemia).  Abnormal cholesterol levels.  High uric acid levels.  Changes related to blood pressure.  Changes related to inflammation. Insulin resistance can be  determined with blood tests. An elevated insulin level when you have not eaten might suggest resistance. Other more complicated tests are sometimes necessary. TREATMENT  Lifestyle changes are the most important treatment for insulin resistance.   If you are overweight and you have insulin resistance, you can improve your insulin sensitivity by losing weight.  Moderate exercise for 30-40 minutes, 4 days a week, can improve insulin sensitivity. Some medicines can also help improve your insulin sensitivity. Your caregiver can discuss these with you if they are appropriate.  HOME CARE INSTRUCTIONS   Do not smoke.  Keep your weight at a healthy level.  Get exercise.  If you have diabetes, follow your caregiver's directions.  If you have high blood pressure, follow your caregiver's directions.  Only take prescription medicines for pain, fever, or discomfort as directed by your caregiver. SEEK MEDICAL CARE IF:   You are diabetic and you are having problems keeping your blood glucose levels at target range.  You are having episodes of low blood glucose (hypoglycemia).  You feel you might be having side effects from your medicines.  You have symptoms of an illness that is not improving after 3-4 days.  You have a sore or wound that is not healing.  You notice a change in vision or a new problem with your vision. SEEK IMMEDIATE MEDICAL CARE IF:   Your blood glucose goes below 70, especially if you have confusion, lightheadedness, or other symptoms with it.  Your blood glucose is very high (as advised by your caregiver) twice in a row.  You pass out.  You have chest pain or trouble breathing.  You have a sudden, severe headache.  You have sudden weakness  in one arm or one leg.  You have sudden difficulty speaking or swallowing.  You develop vomiting or diarrhea that is getting worse or not improving after 1 day. Document Released: 11/25/2005 Document Revised: 04/06/2012  Document Reviewed: 03/17/2013 [facility] Patient Information 2015 [place], [place]. This information is not intended to replace advice given to you by your health care provider. Make sure you discuss any questions you have with your health care provider.

## 2014-05-10 NOTE — Assessment & Plan Note (Signed)
Lab Results  Component Value Date   HGBA1C 10.8* 04/20/2014  long hx noncompliance reviewed today - complicated by drug intol and allergies metaglip increased to twice daily August 2014 when A1c not goal, but noncompliant with same Add Levemir 20U to ongoing Januvia Pending eval by endo Dwyane Dee scheduled 06/12/14 The patient is asked to make an attempt to improve diet and exercise patterns to aid in medical management of this problem.

## 2014-05-11 ENCOUNTER — Telehealth: Payer: Self-pay | Admitting: Internal Medicine

## 2014-05-11 MED ORDER — INSULIN DETEMIR 100 UNIT/ML FLEXPEN: 20.0000 [IU] | PEN_INJECTOR | Freq: Every day | SUBCUTANEOUS | Status: DC

## 2014-05-11 NOTE — Telephone Encounter (Signed)
Notified pt spoke with daughter [name]) sent levemir to meds-by-mail...Pamela Morgan

## 2014-05-11 NOTE — Telephone Encounter (Signed)
Patient states a script was sent to her local pharmacy for levemir.  She states the cost is too high.  She is requesting that a script be sent meds by mail through the [place].

## 2014-05-11 NOTE — Telephone Encounter (Signed)
Relevant patient education assigned to patient using Emmi. ° °

## 2014-06-12 ENCOUNTER — Other Ambulatory Visit: Payer: Self-pay | Admitting: *Deleted

## 2014-06-12 ENCOUNTER — Telehealth: Payer: Self-pay | Admitting: Internal Medicine

## 2014-06-12 ENCOUNTER — Encounter: Payer: Self-pay | Admitting: Endocrinology

## 2014-06-12 ENCOUNTER — Ambulatory Visit (INDEPENDENT_AMBULATORY_CARE_PROVIDER_SITE_OTHER): Payer: Medicare Other | Admitting: Endocrinology

## 2014-06-12 VITALS — BP 124/72 | HR 62 | Temp 98.2°F | Resp 16 | Ht 63.0 in | Wt 178.0 lb

## 2014-06-12 DIAGNOSIS — E785 Hyperlipidemia, unspecified: Secondary | ICD-10-CM

## 2014-06-12 DIAGNOSIS — I10 Essential (primary) hypertension: Secondary | ICD-10-CM | POA: Diagnosis not present

## 2014-06-12 DIAGNOSIS — IMO0001 Reserved for inherently not codable concepts without codable children: Secondary | ICD-10-CM | POA: Diagnosis not present

## 2014-06-12 DIAGNOSIS — E1165 Type 2 diabetes mellitus with hyperglycemia: Principal | ICD-10-CM

## 2014-06-12 LAB — BASIC METABOLIC PANEL
BUN: 12 mg/dL (ref 6–23)
CALCIUM: 9.8 mg/dL (ref 8.4–10.5)
CO2: 28 meq/L (ref 19–32)
Chloride: 100 mEq/L (ref 96–112)
Creatinine, Ser: 0.9 mg/dL (ref 0.4–1.2)
GFR: 77.2 mL/min (ref >60.00)
GLUCOSE: 209 mg/dL — AB (ref 70–99)
Potassium: 3.4 mEq/L — ABNORMAL LOW (ref 3.5–5.1)
Sodium: 139 mEq/L (ref 135–145)

## 2014-06-12 MED ORDER — METFORMIN HCL ER 500 MG PO TB24: 500.0000 mg | ORAL_TABLET | Freq: Every day | ORAL | Status: DC

## 2014-06-12 MED ORDER — GLIMEPIRIDE 1 MG PO TABS: 1.0000 mg | ORAL_TABLET | Freq: Every day | ORAL | Status: DC

## 2014-06-12 MED ORDER — GLIMEPIRIDE 1 MG PO TABS
1.0000 mg | ORAL_TABLET | Freq: Every day | ORAL | Status: DC
Start: 2014-06-12 — End: 2015-05-10

## 2014-06-12 NOTE — Patient Instructions (Signed)
Start taking Metformin 500 mg, 1 tablet with your main meal for 5 days. Occasionally this may initially cause loose stools or nausea. If  tolerating well after 5 days add a second Metformin tablet (500 mg) at the same time. Continue adding another tablet after 5 days days if no persistent nausea or diarrhea until reaching the maximum tolerated dose or the full dose of 3 tablets once daily.  Protein at breakfast like low fat yogurt or low fat meat at breakfast  Glimeperide before breakfast  Please check blood sugars at least half the time about 2 hours after any meal and times per week on waking up. Please bring blood sugar monitor to each visit  Restart insulin when getting steroid shot

## 2014-06-12 NOTE — Telephone Encounter (Signed)
Patient is requesting scripts for allegra and one touch ultra 2 glucose blood test strips and stickers? Sent to meds by mail.
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
## 2014-06-13 LAB — FRUCTOSAMINE: Fructosamine: 383 umol/L — ABNORMAL HIGH (ref 0–285)

## 2014-06-14 ENCOUNTER — Other Ambulatory Visit: Payer: Self-pay

## 2014-06-14 ENCOUNTER — Telehealth: Payer: Self-pay

## 2014-06-14 MED ORDER — FEXOFENADINE HCL 180 MG PO TABS: 180.0000 mg | ORAL_TABLET | Freq: Every day | ORAL | Status: DC

## 2014-06-14 MED ORDER — GLUCOSE BLOOD VI STRP: ORAL_STRIP | Status: DC

## 2014-06-14 NOTE — Telephone Encounter (Signed)
erx for allegra was printed instead of erxed the first time. Sent to [facility]

## 2014-07-06 ENCOUNTER — Ambulatory Visit: Payer: Medicare Other | Admitting: Endocrinology

## 2014-07-25 ENCOUNTER — Ambulatory Visit: Payer: Medicare Other | Admitting: Endocrinology

## 2014-07-26 ENCOUNTER — Ambulatory Visit: Payer: Medicare Other | Admitting: Dietician

## 2014-07-27 ENCOUNTER — Ambulatory Visit: Payer: Medicare Other | Admitting: Endocrinology

## 2014-08-21 ENCOUNTER — Encounter: Payer: Self-pay | Admitting: Family

## 2014-08-21 ENCOUNTER — Ambulatory Visit (INDEPENDENT_AMBULATORY_CARE_PROVIDER_SITE_OTHER): Payer: Medicare Other | Admitting: Family

## 2014-08-21 VITALS — BP 130/84 | HR 48 | Temp 98.2°F | Resp 18 | Ht 64.0 in | Wt 182.8 lb

## 2014-08-21 DIAGNOSIS — T148 Other injury of unspecified body region: Secondary | ICD-10-CM | POA: Diagnosis not present

## 2014-08-21 DIAGNOSIS — W57XXXA Bitten or stung by nonvenomous insect and other nonvenomous arthropods, initial encounter: Secondary | ICD-10-CM | POA: Diagnosis not present

## 2014-08-21 MED ORDER — TRIAMCINOLONE ACETONIDE 0.1 % EX CREA: 1.0000 "application " | TOPICAL_CREAM | Freq: Two times a day (BID) | CUTANEOUS | Status: DC

## 2014-08-21 MED ORDER — DOXYCYCLINE HYCLATE 100 MG PO TABS: 100.0000 mg | ORAL_TABLET | Freq: Two times a day (BID) | ORAL | Status: DC

## 2014-08-21 NOTE — Progress Notes (Signed)
Pre visit review using our clinic review tool, if applicable. No additional management support is needed unless otherwise documented below in the visit note. 

## 2014-08-21 NOTE — Assessment & Plan Note (Signed)
Symptoms and exam consistent with bed bugs, but cannot rule out ticks although unlikely. Start doxycycline x 7 days and triamcinalone cream as needed for itching. Given instructions on bed bug treatments and care. Follow up if symptoms worsen or fail to improve.

## 2014-08-21 NOTE — Patient Instructions (Signed)
Thank you for choosing [facility].  Summary/Instructions:   I have sent your antibiotic and steroid cream to the pharmacy  Please use the steroid cream 2x daily  Please take the antibiotic completely.   Please follow up if your symptoms worsen or fail to improve.    Bedbugs Bedbugs are tiny bugs that live in and around beds. During the day, they hide in mattresses and other places near beds. They come out at night and bite people lying in bed. They need blood to live and grow. Bedbugs can be found in beds anywhere. Usually, they are found in places where many people come and go (hotels, shelters, hospitals). It does not matter whether the place is dirty or clean. Getting bitten by bedbugs rarely causes a medical problem. The biggest problem can be getting rid of them. This often takes the work of a Financial risk analyst. CAUSES  Less use of pesticides. Bedbugs were common before the 1950s. Then, strong pesticides such as DDT nearly wiped them out. Today, these pesticides are not used because they harm the environment and can cause health problems.  More travel. Besides mattresses, bedbugs can also live in clothing and luggage. They can come along as people travel from place to place. Bedbugs are more common in certain parts of the world. When people travel to those areas, the bugs can come home with them.  Presence of birds and bats. Bedbugs often infest birds and bats. If you have these animals in or near your home, bedbugs may infest your house, too. SYMPTOMS It does not hurt to be bitten by a bedbug. You will probably not wake up when you are bitten. Bedbugs usually bite areas of the skin that are not covered. Symptoms may show when you wake up, or they may take a day or more to show up. Symptoms may include:  Small red bumps on the skin. These might be lined up in a row or clustered in a group.  A darker red dot in the middle of red bumps.  Blisters on the skin. There may be  swelling and very bad itching. These may be signs of an allergic reaction. This does not happen often. DIAGNOSIS Bedbug bites might look and feel like other types of insect bites. The bugs do not stay on the body like ticks or lice. They bite, drop off, and crawl away to hide. Your caregiver will probably:  Ask about your symptoms.  Ask about your recent activities and travel.  Check your skin for bedbug bites.  Ask you to check at home for signs of bedbugs. You should look for:  Spots or stains on the bed or nearby. This could be from bedbugs that were crushed or from their eggs or waste.  Bedbugs themselves. They are reddish-brown, oval, and flat. They do not fly. They are about the size of an apple seed.  Places to look for bedbugs include:  Beds. Check mattresses, headboards, box springs, and bed frames.  On drapes and curtains near the bed.  Under carpeting in the bedroom.  Behind electrical outlets.  Behind any wallpaper that is peeling.  Inside luggage. TREATMENT Most bedbug bites do not need treatment. They usually go away on their own in a few days. The bites are not dangerous. However, treatment may be needed if you have scratched so much that your skin has become infected. You may also need treatment if you are allergic to bedbug bites. Treatment options include:  A drug that  stops swelling and itching (corticosteroid). Usually, a cream is rubbed on the skin. If you have a bad rash, you may be given a corticosteroid pill.  Oral antihistamines. These are pills to help control itching.  Antibiotic medicines. An antibiotic may be prescribed for infected skin. HOME CARE INSTRUCTIONS   Take any medicine prescribed by your caregiver for your bites. Follow the directions carefully.  Consider wearing pajamas with long sleeves and pant legs.  Your bedroom may need to be treated. A pest control expert should make sure the bedbugs are gone. You may need to throw away  mattresses or luggage. Ask the pest control expert what you can do to keep the bedbugs from coming back. Common suggestions include:  Putting a plastic cover over your mattress.  Washing and drying your clothes and bedding in hot water and a hot dryer. The temperature should be hotter than 120 F (48.9 C). Bedbugs are killed by high temperatures.  Vacuuming carefully all around your bed. Vacuum in all cracks and crevices where the bugs might hide. Do this often.  Carefully checking all used furniture, bedding, or clothes that you bring into your house.  Eliminating bird nests and bat roosts.  If you get bedbug bites when traveling, check all your possessions carefully before bringing them into your house. If you find any bugs on clothes or in your luggage, consider throwing those items away. SEEK MEDICAL CARE IF:  You have red bug bites that keep coming back.  You have red bug bites that itch badly.  You have bug bites that cause a skin rash.  You have scratch marks that are red and sore. SEEK IMMEDIATE MEDICAL CARE IF: You have a fever. Document Released: 11/08/2010 Document Revised: 12/29/2011 Document Reviewed: 11/08/2010 [facility] Patient Information 2015 [place], [place]. This information is not intended to replace advice given to you by your health care provider. Make sure you discuss any questions you have with your health care provider.

## 2014-08-21 NOTE — Progress Notes (Signed)
Subjective:    [name], female    DOB: 1945-12-07, 68 y.o.   MRN: [id number]  Chief Complaint  Patient presents with  . Bug bites    Possible bug bites on different parts of body, x 3 days     HPI:  Pamela Morgan is a 68 y.o. female who presents today for insect bites.   Found several spots on left arm, right chest, right buttock and left leg starting about 3 days ago. Found a bug on the mattress yesterday and when she killed it there was blood. Has not done any treatments for these areas and has not noticed anything that makes it better or worse.   Allergies  Allergen Reactions  . Cinnamon Swelling  . Clonidine Hydrochloride     REACTION: unspecified  . Codeine     REACTION: itching  . Corn-Containing Products   . Ezetimibe-Simvastatin   . Fish Allergy   . Fish Oil   . Hydrochlorothiazide W-Triamterene   . Influenza Vaccines   . Iodine   . Isradipine   . Latex   . Lovastatin   . Metformin     REACTION: bloating at high dose  . Other Itching    Adhesive tape   . Peanut-Containing Drug Products     "nuts"  . Statins   . Sulfadiazine   . Sulfamethoxazole     REACTION: unspecified  . Watermelon Concentrate [Citrullus Vulgaris] Swelling   Current Outpatient Prescriptions on File Prior to Visit  Medication Sig Dispense Refill  . albuterol (PROAIR HFA) 108 (90 BASE) MCG/ACT inhaler Inhale 2 puffs into the lungs 4 (four) times daily as needed. 3 Inhaler 3  . amLODipine (NORVASC) 5 MG tablet Take 1 tablet (5 mg total) by mouth daily. 90 tablet 3  . aspirin 81 MG tablet Take 81 mg by mouth daily.      . budesonide (PULMICORT) 180 MCG/ACT inhaler Inhale 1 puff into the lungs 2 (two) times daily. 3 Inhaler 3  . docusate sodium (COLACE) 100 MG capsule Take 100 mg by mouth 2 (two) times daily.    Marland Kitchen EPINEPHrine (EPIPEN) 0.3 mg/0.3 mL DEVI Inject 0.3 mLs (0.3 mg total) into the muscle as needed. 3 Device 3  . fexofenadine (ALLEGRA) 180 MG tablet Take 1  tablet (180 mg total) by mouth daily. 90 tablet 3  . Fluticasone-Salmeterol (ADVAIR) 100-50 MCG/DOSE AEPB Inhale 1 puff into the lungs 2 (two) times daily. 1 each 11  . glimepiride (AMARYL) 1 MG tablet Take 1 tablet (1 mg total) by mouth daily with breakfast. 90 tablet 1  . glucose blood test strip Use as instructed 100 each 5  . hydrocortisone (ANUSOL-HC) 25 MG suppository Place 1 suppository (25 mg total) rectally daily. 36 suppository 1  . Insulin Detemir (LEVEMIR) 100 UNIT/ML Pen Inject 20 Units into the skin daily at 10 pm. 45 mL 3  . losartan-hydrochlorothiazide (HYZAAR) 100-25 MG per tablet Take 1 tablet by mouth daily. 90 tablet 3  . metFORMIN (GLUCOPHAGE XR) 500 MG 24 hr tablet Take 1 tablet (500 mg total) by mouth daily with breakfast. 90 tablet 1  . methocarbamol (ROBAXIN) 500 MG tablet Take 1 tablet (500 mg total) by mouth 2 (two) times daily. 20 tablet 0  . naproxen (NAPROSYN) 500 MG tablet Take 1 tablet (500 mg total) by mouth 2 (two) times daily with a meal. 30 tablet 0  . olopatadine (PATANOL) 0.1 % ophthalmic solution Place 1 drop into both  eyes 2 (two) times daily. 15 mL 3  . sitaGLIPtin (JANUVIA) 100 MG tablet Take 1 tablet (100 mg total) by mouth daily. 90 tablet 3  . traMADol (ULTRAM) 50 MG tablet Take 1-2 tablets (50-100 mg total) by mouth every 6 (six) hours as needed for moderate pain or severe pain. 100 tablet 1   No current facility-administered medications on file prior to visit.     Review of Systems   See HPI  Objective:    BP 130/84 mmHg  Pulse 48  Temp(Src) 98.2 F (36.8 C) (Oral)  Resp 18  Ht 5\' 4"  (1.626 m)  Wt 182 lb 12.8 oz (82.918 kg)  BMI 31.36 kg/m2  SpO2 97%  LMP 03/19/1986  LMP 03/19/1986 Nursing note and vital signs reviewed.  Physical Exam  Constitutional: She is oriented to person, place, and time. She appears well-developed and well-nourished. No distress.  Cardiovascular: Normal rate, regular rhythm and normal heart sounds.     Pulmonary/Chest: Effort normal and breath sounds normal.  Neurological: She is alert and oriented to person, place, and time.  Skin: Skin is warm and dry.  Firm macular rashes that are itchy noted on the left arm, right chest, right buttock and left leg. Each is warm to the touch.   Psychiatric: She has a normal mood and affect. Her behavior is normal. Judgment and thought content normal.      Assessment & Plan:

## 2014-09-05 DIAGNOSIS — Z0289 Encounter for other administrative examinations: Secondary | ICD-10-CM

## 2014-09-19 ENCOUNTER — Telehealth: Payer: Self-pay | Admitting: Internal Medicine

## 2014-09-19 ENCOUNTER — Encounter: Payer: Self-pay | Admitting: Family

## 2014-09-19 ENCOUNTER — Ambulatory Visit (INDEPENDENT_AMBULATORY_CARE_PROVIDER_SITE_OTHER): Payer: Medicare Other | Admitting: Family

## 2014-09-19 VITALS — BP 160/90 | HR 58 | Temp 98.5°F | Resp 18 | Ht 64.0 in | Wt 180.0 lb

## 2014-09-19 DIAGNOSIS — L509 Urticaria, unspecified: Secondary | ICD-10-CM

## 2014-09-19 MED ORDER — METHYLPREDNISOLONE (PAK) 4 MG PO TABS: ORAL_TABLET | ORAL | Status: DC

## 2014-09-19 MED ORDER — METHYLPREDNISOLONE SODIUM SUCC 125 MG IJ SOLR
125.0000 mg | Freq: Once | INTRAMUSCULAR | Status: AC
  Administered 2014-09-19: 125 mg via INTRAMUSCULAR

## 2014-09-19 NOTE — Patient Instructions (Signed)
Thank you for choosing [facility].  Summary/Instructions:  Your prescription(s) have been submitted to your pharmacy. Please take as directed and contact our office if you believe you are having problem(s) with the medication(s).  If your symptoms worsen or fail to improve, please contact our office for further instruction, or in case of emergency go directly to the emergency room at the closest medical facility.   Contact Dermatitis Contact dermatitis is a reaction to certain substances that touch the skin. Contact dermatitis can be either irritant contact dermatitis or allergic contact dermatitis. Irritant contact dermatitis does not require previous exposure to the substance for a reaction to occur.Allergic contact dermatitis only occurs if you have been exposed to the substance before. Upon a repeat exposure, your body reacts to the substance.  CAUSES  Many substances can cause contact dermatitis. Irritant dermatitis is most commonly caused by repeated exposure to mildly irritating substances, such as:  Makeup.  Soaps.  Detergents.  Bleaches.  Acids.  Metal salts, such as nickel. Allergic contact dermatitis is most commonly caused by exposure to:  Poisonous plants.  Chemicals (deodorants, shampoos).  Jewelry.  Latex.  Neomycin in triple antibiotic cream.  Preservatives in products, including clothing. SYMPTOMS  The area of skin that is exposed may develop:  Dryness or flaking.  Redness.  Cracks.  Itching.  Pain or a burning sensation.  Blisters. With allergic contact dermatitis, there may also be swelling in areas such as the eyelids, mouth, or genitals.  DIAGNOSIS  Your caregiver can usually tell what the problem is by doing a physical exam. In cases where the cause is uncertain and an allergic contact dermatitis is suspected, a patch skin test may be performed to help determine the cause of your dermatitis. TREATMENT Treatment includes protecting  the skin from further contact with the irritating substance by avoiding that substance if possible. Barrier creams, powders, and gloves may be helpful. Your caregiver may also recommend:  Steroid creams or ointments applied 2 times daily. For best results, soak the rash area in cool water for 20 minutes. Then apply the medicine. Cover the area with a plastic wrap. You can store the steroid cream in the refrigerator for a "chilly" effect on your rash. That may decrease itching. Oral steroid medicines may be needed in more severe cases.  Antibiotics or antibacterial ointments if a skin infection is present.  Antihistamine lotion or an antihistamine taken by mouth to ease itching.  Lubricants to keep moisture in your skin.  Burow's solution to reduce redness and soreness or to dry a weeping rash. Mix one packet or tablet of solution in 2 cups cool water. Dip a clean washcloth in the mixture, wring it out a bit, and put it on the affected area. Leave the cloth in place for 30 minutes. Do this as often as possible throughout the day.  Taking several cornstarch or baking soda baths daily if the area is too large to cover with a washcloth. Harsh chemicals, such as alkalis or acids, can cause skin damage that is like a burn. You should flush your skin for 15 to 20 minutes with cold water after such an exposure. You should also seek immediate medical care after exposure. Bandages (dressings), antibiotics, and pain medicine may be needed for severely irritated skin.  HOME CARE INSTRUCTIONS  Avoid the substance that caused your reaction.  Keep the area of skin that is affected away from hot water, soap, sunlight, chemicals, acidic substances, or anything else that would irritate   your skin.  Do not scratch the rash. Scratching may cause the rash to become infected.  You may take cool baths to help stop the itching.  Only take over-the-counter or prescription medicines as directed by your  caregiver.  See your caregiver for follow-up care as directed to make sure your skin is healing properly. SEEK MEDICAL CARE IF:   Your condition is not better after 3 days of treatment.  You seem to be getting worse.  You see signs of infection such as swelling, tenderness, redness, soreness, or warmth in the affected area.  You have any problems related to your medicines. Document Released: 10/03/2000 Document Revised: 12/29/2011 Document Reviewed: 03/11/2011 Exit[employer] Patient Information 2015 [employer]. This information is not intended to replace advice given to you by your health care provider. Make sure you discuss any questions you have with your health care provider.   

## 2014-09-19 NOTE — Progress Notes (Signed)
Pre visit review using our clinic review tool, if applicable. No additional management support is needed unless otherwise documented below in the visit note. 

## 2014-09-19 NOTE — Telephone Encounter (Signed)
Patient is requesting all her meds to be sent to [facility]

## 2014-09-19 NOTE — Progress Notes (Signed)
Subjective:    [name], female    DOB: 03-07-1946, 68 y.o.   MRN: [id number]  Chief Complaint  Patient presents with  . Breakout    Has red spots all over body that itch x1 month    HPI:  Pamela Morgan is a 68 y.o. female who presents today for an acute visit.   Describes acute itching that has been going on for about a month. Has several spots on her legs and back. Has tried olive oil and benedryl, but at nights the itching returns.  Has changed a washing powders within the past month at around the time that symptoms started. Denies changes to clothing, lotions, or medications.   Allergies  Allergen Reactions  . Cinnamon Swelling  . Clonidine Hydrochloride     REACTION: unspecified  . Codeine     REACTION: itching  . Corn-Containing Products   . Ezetimibe-Simvastatin   . Fish Allergy   . Fish Oil   . Hydrochlorothiazide W-Triamterene   . Influenza Vaccines   . Iodine   . Isradipine   . Latex   . Lovastatin   . Metformin     REACTION: bloating at high dose  . Other Itching    Adhesive tape   . Peanut-Containing Drug Products     "nuts"  . Statins   . Sulfadiazine   . Sulfamethoxazole     REACTION: unspecified  . Watermelon Concentrate [Citrullus Vulgaris] Swelling   Current Outpatient Prescriptions on File Prior to Visit  Medication Sig Dispense Refill  . albuterol (PROAIR HFA) 108 (90 BASE) MCG/ACT inhaler Inhale 2 puffs into the lungs 4 (four) times daily as needed. 3 Inhaler 3  . amLODipine (NORVASC) 5 MG tablet Take 1 tablet (5 mg total) by mouth daily. 90 tablet 3  . aspirin 81 MG tablet Take 81 mg by mouth daily.      . budesonide (PULMICORT) 180 MCG/ACT inhaler Inhale 1 puff into the lungs 2 (two) times daily. 3 Inhaler 3  . docusate sodium (COLACE) 100 MG capsule Take 100 mg by mouth 2 (two) times daily.    Marland Kitchen doxycycline (VIBRA-TABS) 100 MG tablet Take 1 tablet (100 mg total) by mouth 2 (two) times daily. 14 tablet 0  . EPINEPHrine  (EPIPEN) 0.3 mg/0.3 mL DEVI Inject 0.3 mLs (0.3 mg total) into the muscle as needed. 3 Device 3  . fexofenadine (ALLEGRA) 180 MG tablet Take 1 tablet (180 mg total) by mouth daily. 90 tablet 3  . Fluticasone-Salmeterol (ADVAIR) 100-50 MCG/DOSE AEPB Inhale 1 puff into the lungs 2 (two) times daily. 1 each 11  . glimepiride (AMARYL) 1 MG tablet Take 1 tablet (1 mg total) by mouth daily with breakfast. 90 tablet 1  . glucose blood test strip Use as instructed 100 each 5  . hydrocortisone (ANUSOL-HC) 25 MG suppository Place 1 suppository (25 mg total) rectally daily. 36 suppository 1  . Insulin Detemir (LEVEMIR) 100 UNIT/ML Pen Inject 20 Units into the skin daily at 10 pm. 45 mL 3  . losartan-hydrochlorothiazide (HYZAAR) 100-25 MG per tablet Take 1 tablet by mouth daily. 90 tablet 3  . metFORMIN (GLUCOPHAGE XR) 500 MG 24 hr tablet Take 1 tablet (500 mg total) by mouth daily with breakfast. 90 tablet 1  . methocarbamol (ROBAXIN) 500 MG tablet Take 1 tablet (500 mg total) by mouth 2 (two) times daily. 20 tablet 0  . naproxen (NAPROSYN) 500 MG tablet Take 1 tablet (500 mg total) by  mouth 2 (two) times daily with a meal. 30 tablet 0  . olopatadine (PATANOL) 0.1 % ophthalmic solution Place 1 drop into both eyes 2 (two) times daily. 15 mL 3  . sitaGLIPtin (JANUVIA) 100 MG tablet Take 1 tablet (100 mg total) by mouth daily. 90 tablet 3  . traMADol (ULTRAM) 50 MG tablet Take 1-2 tablets (50-100 mg total) by mouth every 6 (six) hours as needed for moderate pain or severe pain. 100 tablet 1  . triamcinolone cream (KENALOG) 0.1 % Apply 1 application topically 2 (two) times daily. 30 g 0   No current facility-administered medications on file prior to visit.    Review of Systems    See HPI Objective:    BP 160/90 mmHg  Pulse 58  Temp(Src) 98.5 F (36.9 C) (Oral)  Resp 18  Ht 5\' 4"  (1.626 m)  Wt 180 lb (81.647 kg)  BMI 30.88 kg/m2  SpO2 98%  LMP 03/19/1986 Nursing note and vital signs  reviewed.  Physical Exam  Constitutional: She is oriented to person, place, and time. She appears well-developed and well-nourished. No distress.  Cardiovascular: Normal rate, regular rhythm, normal heart sounds and intact distal pulses.   Pulmonary/Chest: Effort normal and breath sounds normal.  Neurological: She is alert and oriented to person, place, and time.  Skin: Skin is warm and dry.  Several patches of urticarial rash noted on legs and back.   Psychiatric: She has a normal mood and affect. Her behavior is normal. Judgment and thought content normal.       Assessment & Plan:

## 2014-09-19 NOTE — Assessment & Plan Note (Addendum)
Symptoms and exam consistent with allergic reaction. In office Solu-Medrol injection given. Start Medrol Dosepak tomorrow. Discontinue use of new detergent. Recommend changing back to old formulation. Follow-up if symptoms worsen or fail to improve.

## 2014-09-20 MED ORDER — EPINEPHRINE 0.3 MG/0.3ML IJ SOAJ: 0.3000 mg | Freq: Once | INTRAMUSCULAR | Status: DC

## 2014-09-20 MED ORDER — ASPIRIN 81 MG PO TABS: 81.0000 mg | ORAL_TABLET | Freq: Every day | ORAL | Status: DC

## 2014-09-20 MED ORDER — FLUTICASONE-SALMETEROL 100-50 MCG/DOSE IN AEPB: 1.0000 | INHALATION_SPRAY | Freq: Two times a day (BID) | RESPIRATORY_TRACT | Status: DC

## 2014-09-20 MED ORDER — LOSARTAN POTASSIUM-HCTZ 100-25 MG PO TABS: 1.0000 | ORAL_TABLET | Freq: Every day | ORAL | Status: DC

## 2014-09-20 MED ORDER — GLUCOSE BLOOD VI STRP: ORAL_STRIP | Status: DC

## 2014-09-20 MED ORDER — TRIAMCINOLONE ACETONIDE 0.1 % EX CREA: 1.0000 "application " | TOPICAL_CREAM | Freq: Two times a day (BID) | CUTANEOUS | Status: DC

## 2014-09-20 MED ORDER — AMLODIPINE BESYLATE 5 MG PO TABS: 5.0000 mg | ORAL_TABLET | Freq: Every day | ORAL | Status: DC

## 2014-09-20 MED ORDER — ALBUTEROL SULFATE HFA 108 (90 BASE) MCG/ACT IN AERS: 2.0000 | INHALATION_SPRAY | Freq: Four times a day (QID) | RESPIRATORY_TRACT | Status: DC | PRN

## 2014-09-20 MED ORDER — ONETOUCH ULTRASOFT LANCETS MISC: Status: DC

## 2014-09-20 NOTE — Telephone Encounter (Signed)
[facility], i.e., two blood pressure meds and epi-pens.

## 2014-09-20 NOTE — Telephone Encounter (Signed)
Spoke to pt.   Sent refills to pharmacy.

## 2014-11-13 ENCOUNTER — Ambulatory Visit: Payer: Medicare Other | Admitting: Internal Medicine

## 2014-11-16 ENCOUNTER — Ambulatory Visit: Payer: Medicare Other | Admitting: Family

## 2014-11-17 ENCOUNTER — Encounter: Payer: Self-pay | Admitting: Family

## 2014-11-17 ENCOUNTER — Ambulatory Visit (INDEPENDENT_AMBULATORY_CARE_PROVIDER_SITE_OTHER): Payer: Medicare Other | Admitting: Family

## 2014-11-17 VITALS — BP 160/90 | HR 50 | Temp 98.3°F | Resp 18 | Ht 64.0 in | Wt 180.0 lb

## 2014-11-17 DIAGNOSIS — R05 Cough: Secondary | ICD-10-CM | POA: Insufficient documentation

## 2014-11-17 DIAGNOSIS — M25511 Pain in right shoulder: Secondary | ICD-10-CM | POA: Insufficient documentation

## 2014-11-17 DIAGNOSIS — R059 Cough, unspecified: Secondary | ICD-10-CM | POA: Insufficient documentation

## 2014-11-17 MED ORDER — AMOXICILLIN-POT CLAVULANATE 875-125 MG PO TABS: 1.0000 | ORAL_TABLET | Freq: Two times a day (BID) | ORAL | Status: DC

## 2014-11-17 NOTE — Assessment & Plan Note (Signed)
Symptoms and exam consistent with bacterial sinusitis. Start Augmentin. Continue over-the-counter medications as needed for symptom relief. Continue prescribed medications as needed for wheezing. Follow-up if symptoms worsen or fail to improve.

## 2014-11-17 NOTE — Assessment & Plan Note (Signed)
Symptoms and exam consistent with potential rotator cuff tendinitis. Continue supportive care at this time with over-the-counter anti-inflammatory medications. Recommend alternating between ice and heat. If symptoms worsen, consider referral to sports medicine or physical therapy.
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
## 2014-11-17 NOTE — Patient Instructions (Signed)
Thank you for choosing [facility].  Summary/Instructions:  Your prescription(s) have been submitted to your pharmacy or been printed and provided for you. Please take as directed and contact our office if you believe you are having problem(s) with the medication(s) or have any questions.  If your symptoms worsen or fail to improve, please contact our office for further instruction, or in case of emergency go directly to the emergency room at the closest medical facility.   General Recommendations:    Please drink plenty of fluids.  Get plenty of rest   Sleep in humidified air  Use saline nasal sprays  Netti pot   OTC Medications:  Decongestants - helps relieve congestion   Flonase (generic fluticasone) or Nasacort (generic triamcinolone) - please make sure to use the "cross-over" technique at a 45 degree angle towards the opposite eye as opposed to straight up the nasal passageway.   If you have HIGH BLOOD PRESSURE - Coricidin HBP; AVOID any product that is -D as this contains pseudoephedrine which may increase your blood pressure.  Afrin (oxymetazoline) every 6-8 hours for up to 3 days.   Allergies - helps relieve runny nose, itchy eyes and sneezing   Claritin (generic loratidine), Allegra (fexofenidine), or Zyrtec (generic cyrterizine) for runny nose. These medications should not cause drowsiness.  Note - Benadryl (generic diphenhydramine) may be used however may cause drowsiness  Cough -   Delsym or Robitussin (generic dextromethorphan)  Expectorants - helps loosen mucus to ease removal   Mucinex (generic guaifenesin) as directed on the package.  Headaches / General Aches   Tylenol (generic acetaminophen) - DO NOT EXCEED 3 grams (3,000 mg) in a 24 hour time period  Advil/Motrin (generic ibuprofen)   Sore Throat -   Salt water gargle   Chloraseptic (generic benzocaine) spray or lozenges / Sucrets (generic dyclonine)    For your shoulder -  alternate ice and heat for 20 minutes 2-3 times per day as needed. Ice after sewing.   Continue ibuprofen or aleve as needed for pain relief.

## 2014-11-17 NOTE — Progress Notes (Signed)
Pre visit review using our clinic review tool, if applicable. No additional management support is needed unless otherwise documented below in the visit note. 

## 2014-12-19 ENCOUNTER — Encounter (HOSPITAL_COMMUNITY): Payer: Self-pay | Admitting: Radiology

## 2014-12-19 ENCOUNTER — Emergency Department (HOSPITAL_COMMUNITY): Payer: Medicare Other

## 2014-12-19 ENCOUNTER — Emergency Department (HOSPITAL_COMMUNITY)
Admission: EM | Admit: 2014-12-19 | Discharge: 2014-12-19 | Disposition: A | Discharge status: Home / Self Care | Payer: Medicare Other | Attending: Emergency Medicine | Admitting: Emergency Medicine

## 2014-12-19 DIAGNOSIS — I10 Essential (primary) hypertension: Secondary | ICD-10-CM | POA: Diagnosis not present

## 2014-12-19 DIAGNOSIS — Z91040 Latex allergy status: Secondary | ICD-10-CM | POA: Diagnosis not present

## 2014-12-19 DIAGNOSIS — Y92511 Restaurant or cafe as the place of occurrence of the external cause: Secondary | ICD-10-CM | POA: Insufficient documentation

## 2014-12-19 DIAGNOSIS — E785 Hyperlipidemia, unspecified: Secondary | ICD-10-CM | POA: Insufficient documentation

## 2014-12-19 DIAGNOSIS — M25562 Pain in left knee: Secondary | ICD-10-CM | POA: Diagnosis not present

## 2014-12-19 DIAGNOSIS — Z791 Long term (current) use of non-steroidal anti-inflammatories (NSAID): Secondary | ICD-10-CM | POA: Insufficient documentation

## 2014-12-19 DIAGNOSIS — Z7982 Long term (current) use of aspirin: Secondary | ICD-10-CM | POA: Insufficient documentation

## 2014-12-19 DIAGNOSIS — G8929 Other chronic pain: Secondary | ICD-10-CM | POA: Insufficient documentation

## 2014-12-19 DIAGNOSIS — W01198A Fall on same level from slipping, tripping and stumbling with subsequent striking against other object, initial encounter: Secondary | ICD-10-CM | POA: Insufficient documentation

## 2014-12-19 DIAGNOSIS — S4991XA Unspecified injury of right shoulder and upper arm, initial encounter: Secondary | ICD-10-CM | POA: Diagnosis not present

## 2014-12-19 DIAGNOSIS — Z794 Long term (current) use of insulin: Secondary | ICD-10-CM | POA: Insufficient documentation

## 2014-12-19 DIAGNOSIS — T07XXXA Unspecified multiple injuries, initial encounter: Secondary | ICD-10-CM

## 2014-12-19 DIAGNOSIS — K589 Irritable bowel syndrome without diarrhea: Secondary | ICD-10-CM | POA: Diagnosis not present

## 2014-12-19 DIAGNOSIS — E119 Type 2 diabetes mellitus without complications: Secondary | ICD-10-CM | POA: Insufficient documentation

## 2014-12-19 DIAGNOSIS — S0990XA Unspecified injury of head, initial encounter: Secondary | ICD-10-CM | POA: Diagnosis not present

## 2014-12-19 DIAGNOSIS — Z7951 Long term (current) use of inhaled steroids: Secondary | ICD-10-CM | POA: Insufficient documentation

## 2014-12-19 DIAGNOSIS — Y998 Other external cause status: Secondary | ICD-10-CM | POA: Insufficient documentation

## 2014-12-19 DIAGNOSIS — S8992XA Unspecified injury of left lower leg, initial encounter: Secondary | ICD-10-CM | POA: Diagnosis not present

## 2014-12-19 DIAGNOSIS — T1490 Injury, unspecified: Secondary | ICD-10-CM | POA: Insufficient documentation

## 2014-12-19 DIAGNOSIS — F430 Acute stress reaction: Secondary | ICD-10-CM | POA: Insufficient documentation

## 2014-12-19 DIAGNOSIS — M545 Low back pain: Secondary | ICD-10-CM | POA: Diagnosis not present

## 2014-12-19 DIAGNOSIS — Z79899 Other long term (current) drug therapy: Secondary | ICD-10-CM | POA: Diagnosis not present

## 2014-12-19 DIAGNOSIS — Y9389 Activity, other specified: Secondary | ICD-10-CM | POA: Diagnosis not present

## 2014-12-19 DIAGNOSIS — T148 Other injury of unspecified body region: Secondary | ICD-10-CM | POA: Diagnosis not present

## 2014-12-19 DIAGNOSIS — W19XXXA Unspecified fall, initial encounter: Secondary | ICD-10-CM

## 2014-12-19 DIAGNOSIS — S8002XA Contusion of left knee, initial encounter: Secondary | ICD-10-CM | POA: Diagnosis not present

## 2014-12-19 DIAGNOSIS — S0083XA Contusion of other part of head, initial encounter: Secondary | ICD-10-CM | POA: Diagnosis not present

## 2014-12-19 DIAGNOSIS — R51 Headache: Secondary | ICD-10-CM | POA: Diagnosis not present

## 2014-12-19 DIAGNOSIS — Z7952 Long term (current) use of systemic steroids: Secondary | ICD-10-CM | POA: Diagnosis not present

## 2014-12-19 DIAGNOSIS — S199XXA Unspecified injury of neck, initial encounter: Secondary | ICD-10-CM | POA: Diagnosis not present

## 2014-12-19 DIAGNOSIS — M25511 Pain in right shoulder: Secondary | ICD-10-CM | POA: Diagnosis not present

## 2014-12-19 DIAGNOSIS — J45909 Unspecified asthma, uncomplicated: Secondary | ICD-10-CM | POA: Diagnosis not present

## 2014-12-19 DIAGNOSIS — Z872 Personal history of diseases of the skin and subcutaneous tissue: Secondary | ICD-10-CM | POA: Diagnosis not present

## 2014-12-19 DIAGNOSIS — S40011A Contusion of right shoulder, initial encounter: Secondary | ICD-10-CM | POA: Diagnosis not present

## 2014-12-19 DIAGNOSIS — S80212A Abrasion, left knee, initial encounter: Secondary | ICD-10-CM | POA: Diagnosis not present

## 2014-12-19 DIAGNOSIS — M542 Cervicalgia: Secondary | ICD-10-CM | POA: Diagnosis not present

## 2014-12-19 DIAGNOSIS — S0081XA Abrasion of other part of head, initial encounter: Secondary | ICD-10-CM | POA: Insufficient documentation

## 2014-12-19 DIAGNOSIS — S0993XA Unspecified injury of face, initial encounter: Secondary | ICD-10-CM | POA: Diagnosis present

## 2014-12-19 MED ORDER — TRAMADOL HCL 50 MG PO TABS
50.0000 mg | ORAL_TABLET | Freq: Once | ORAL | Status: AC
  Administered 2014-12-19: 50 mg via ORAL
  Filled 2014-12-19: qty 1

## 2014-12-19 NOTE — ED Notes (Signed)
Patient transported to CT 
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
## 2014-12-19 NOTE — ED Notes (Addendum)
Per EMS. Pt fell in restaurant with family. Pt states she landed on her face. Pt has abrasion below L eye. Has had back spasms since fall a year ago, which have gotten worse since fall. Pt also has R shoulder/upper arm pain and L knee pain. Pt hypertensive with EMS and on arrival. Hx of HTN.

## 2014-12-19 NOTE — Progress Notes (Signed)
CSW met with pt at bedside. Daughter was present. Pt confirms that she fell today. Pt states that she is able to complete her ADL's independently.  Daughter states that she lives with pt. She states that she does not need any help managing pt at home at this time. Also, pt informed CSW that she is not interested in a facility.   , LCSWA [phone] ED CSW 12/19/2014 5:15 PM    

## 2014-12-19 NOTE — Discharge Instructions (Signed)
Contusion A contusion is a deep bruise. Contusions are the result of an injury that caused bleeding under the skin. The contusion may turn blue, purple, or yellow. Minor injuries will give you a painless contusion, but more severe contusions may stay painful and swollen for a few weeks.  CAUSES  A contusion is usually caused by a blow, trauma, or direct force to an area of the body. SYMPTOMS   Swelling and redness of the injured area.  Bruising of the injured area.  Tenderness and soreness of the injured area.  Pain. DIAGNOSIS  The diagnosis can be made by taking a history and physical exam. An X-ray, CT scan, or MRI may be needed to determine if there were any associated injuries, such as fractures. TREATMENT  Specific treatment will depend on what area of the body was injured. In general, the best treatment for a contusion is resting, icing, elevating, and applying cold compresses to the injured area. Over-the-counter medicines may also be recommended for pain control. Ask your caregiver what the best treatment is for your contusion. HOME CARE INSTRUCTIONS   Put ice on the injured area.  Put ice in a plastic bag.  Place a towel between your skin and the bag.  Leave the ice on for 15-20 minutes, 3-4 times a day, or as directed by your health care provider.  Only take over-the-counter or prescription medicines for pain, discomfort, or fever as directed by your caregiver. Your caregiver may recommend avoiding anti-inflammatory medicines (aspirin, ibuprofen, and naproxen) for 48 hours because these medicines may increase bruising.  Rest the injured area.  If possible, elevate the injured area to reduce swelling. SEEK IMMEDIATE MEDICAL CARE IF:   You have increased bruising or swelling.  You have pain that is getting worse.  Your swelling or pain is not relieved with medicines. MAKE SURE YOU:   Understand these instructions.  Will watch your condition.  Will get help right  away if you are not doing well or get worse. Document Released: 07/16/2005 Document Revised: 10/11/2013 Document Reviewed: 08/11/2011 [facility] Patient Information 2015 [place], [place]. This information is not intended to replace advice given to you by your health care provider. Make sure you discuss any questions you have with your health care provider.   You may use an ice pack as much as is comfortable for the next 2 days.  You may add heat therapy 20 minutes several times daily starting on Friday.  Your xrays and CT scans are negative today for any acute injury from your fall.

## 2014-12-19 NOTE — ED Notes (Signed)
Bed: [facility] Expected date:  Expected time:  Means of arrival:  Comments: EMS- FALL

## 2014-12-25 ENCOUNTER — Encounter: Payer: Self-pay | Admitting: Internal Medicine

## 2014-12-25 ENCOUNTER — Telehealth: Payer: Self-pay | Admitting: Internal Medicine

## 2014-12-25 ENCOUNTER — Ambulatory Visit (INDEPENDENT_AMBULATORY_CARE_PROVIDER_SITE_OTHER)
Admission: RE | Admit: 2014-12-25 | Discharge: 2014-12-25 | Disposition: A | Discharge status: Home / Self Care | Payer: Medicare Other | Source: Ambulatory Visit | Attending: Internal Medicine | Admitting: Internal Medicine

## 2014-12-25 ENCOUNTER — Other Ambulatory Visit (INDEPENDENT_AMBULATORY_CARE_PROVIDER_SITE_OTHER): Payer: Medicare Other

## 2014-12-25 ENCOUNTER — Ambulatory Visit (INDEPENDENT_AMBULATORY_CARE_PROVIDER_SITE_OTHER): Payer: Medicare Other | Admitting: Internal Medicine

## 2014-12-25 ENCOUNTER — Telehealth: Payer: Self-pay | Admitting: *Deleted

## 2014-12-25 VITALS — BP 140/86 | HR 58 | Temp 98.6°F | Resp 18 | Ht 64.0 in | Wt 178.0 lb

## 2014-12-25 DIAGNOSIS — J441 Chronic obstructive pulmonary disease with (acute) exacerbation: Secondary | ICD-10-CM

## 2014-12-25 DIAGNOSIS — E1165 Type 2 diabetes mellitus with hyperglycemia: Secondary | ICD-10-CM

## 2014-12-25 DIAGNOSIS — E119 Type 2 diabetes mellitus without complications: Secondary | ICD-10-CM | POA: Diagnosis not present

## 2014-12-25 DIAGNOSIS — G8929 Other chronic pain: Secondary | ICD-10-CM | POA: Diagnosis not present

## 2014-12-25 DIAGNOSIS — J45901 Unspecified asthma with (acute) exacerbation: Secondary | ICD-10-CM

## 2014-12-25 DIAGNOSIS — M545 Low back pain: Secondary | ICD-10-CM

## 2014-12-25 DIAGNOSIS — IMO0001 Reserved for inherently not codable concepts without codable children: Secondary | ICD-10-CM

## 2014-12-25 DIAGNOSIS — J449 Chronic obstructive pulmonary disease, unspecified: Secondary | ICD-10-CM

## 2014-12-25 DIAGNOSIS — R0602 Shortness of breath: Secondary | ICD-10-CM | POA: Diagnosis not present

## 2014-12-25 DIAGNOSIS — R05 Cough: Secondary | ICD-10-CM | POA: Diagnosis not present

## 2014-12-25 LAB — CBC WITH DIFFERENTIAL/PLATELET
BASOS ABS: 0.1 10*3/uL (ref 0.0–0.1)
Basophils Relative: 1 % (ref 0.0–3.0)
EOS ABS: 0.9 10*3/uL — AB (ref 0.0–0.7)
EOS PCT: 9.8 % — AB (ref 0.0–5.0)
HCT: 41.9 % (ref 36.0–46.0)
Hemoglobin: 13.7 g/dL (ref 12.0–15.0)
Lymphocytes Relative: 15.7 % (ref 12.0–46.0)
Lymphs Abs: 1.5 10*3/uL (ref 0.7–4.0)
MCHC: 32.8 g/dL (ref 30.0–36.0)
MCV: 83.5 fl (ref 78.0–100.0)
MONOS PCT: 5.6 % (ref 3.0–12.0)
Monocytes Absolute: 0.5 10*3/uL (ref 0.1–1.0)
NEUTROS PCT: 67.9 % (ref 43.0–77.0)
Neutro Abs: 6.3 10*3/uL (ref 1.4–7.7)
PLATELETS: 231 10*3/uL (ref 150.0–400.0)
RBC: 5.01 Mil/uL (ref 3.87–5.11)
RDW: 13.9 % (ref 11.5–15.5)
WBC: 9.3 10*3/uL (ref 4.0–10.5)

## 2014-12-25 LAB — BASIC METABOLIC PANEL
BUN: 14 mg/dL (ref 6–23)
CO2: 32 mEq/L (ref 19–32)
CREATININE: 0.89 mg/dL (ref 0.40–1.20)
Calcium: 9.1 mg/dL (ref 8.4–10.5)
Chloride: 103 mEq/L (ref 96–112)
GFR: 81.09 mL/min (ref >60.00)
GLUCOSE: 132 mg/dL — AB (ref 70–99)
POTASSIUM: 3.5 meq/L (ref 3.5–5.1)
Sodium: 140 mEq/L (ref 135–145)

## 2014-12-25 LAB — HEMOGLOBIN A1C: HEMOGLOBIN A1C: 7.7 % — AB (ref 4.6–6.5)

## 2014-12-25 MED ORDER — AZITHROMYCIN 250 MG PO TABS: ORAL_TABLET | ORAL | Status: DC

## 2014-12-25 MED ORDER — PREDNISONE 20 MG PO TABS: 20.0000 mg | ORAL_TABLET | Freq: Two times a day (BID) | ORAL | Status: DC

## 2014-12-25 NOTE — Telephone Encounter (Signed)
ented as [place] Standard Time. CONFIDENTIALTY NOTICE: This fax transmission is intended only for the addressee. It contains information that is legally privileged, confidential or otherwise protected from use or disclosure. If you are not the intended recipient, you are strictly prohibited from reviewing, disclosing, copying using or disseminating any of this information or taking any action in reliance on or regarding this information. If you have received this fax in error, please notify us immediately by telephone so that we can arrange for its return to [place]. Phone: [phone], Toll-Free: [phone], Fax: [phone] Page: 1 of 2 Call Id: [id number] [place] [facility] - Client [place] Patient Name: Pamela Morgan Gender: Female DOB: August 20, 1946 Age: 69 Y 62 M 10 D Return Phone Number: [phone] (Primary), [phone] (Secondary) Address: [address] City/State/Zip: [place] [address] Client [facility] - Client Client [facility] [place] - Day Physician Gwendolyn Grant Contact Type Call Call Type Triage / Clinical Relationship To Patient Self Appointment Disposition EMR Appointment Not Necessary Return Phone Number [phone] (Primary) Chief Complaint BREATHING - shortness of breath or sounds breathless Initial Comment Caller states she is having shortness of breath and her medication is not working. PreDisposition Call Doctor Info pasted into Epic Yes Nurse Assessment Nurse: Wynetta Emery, RN, Baker Janus Date/Time Eilene Ghazi Time): 12/25/2014 9:24:47 AM Confirm and document reason for call. If symptomatic, describe symptoms. ---[name] is having difficulty breathing and medications is not working neb tx not feeling coughing continues onset one month. Has the patient traveled out of the country within the last 30 days? ---No Does the patient require triage? ---Yes Related visit to physician within  the last 2 weeks? ---No Does the PT have any chronic conditions? (i.e. diabetes, asthma, etc.) ---Yes List chronic conditions. ---Asthma Guidelines Guideline Title Affirmed Question Affirmed Notes Nurse Date/Time (Eastern Time) Breathing Difficulty SEVERE difficulty breathing (e.g., struggling for each breath, speaks in single words) Wynetta Emery, RN, Baker Janus 12/25/2014 9:26:20 AM Disp. Time Eilene Ghazi Time) Disposition Final User 12/25/2014 9:21:40 AM Send to Urgent Tyler Deis 12/25/2014 9:36:15 AM 911 Follow Up Call Attempted Wynetta Emery, RN, Baker Janus Reason: [name], daughter states 47 has been called and they are waiting for them to arrive. --- very short of breath, audible wheezing present over phone line, and talking in short phrases. PLEASE NOTE: All timestamps contained within this report are represented as [place] Standard Time. CONFIDENTIALTY NOTICE: This fax transmission is intended only for the addressee. It contains information that is legally privileged, confidential or otherwise protected from use or disclosure. If you are not the intended recipient, you are strictly prohibited from reviewing, disclosing, copying using or disseminating any of this information or taking any action in reliance on or regarding this information. If you have received this fax in error, please notify us immediately by telephone so that we can arrange for its return to [place]. Phone: [phone], Toll-Free: [phone], Fax: [phone] Page: 2 of 2 Call Id: [id number] 12/25/2014 9:27:27 AM Call EMS 911 Now Yes Wynetta Emery, RN, Christin Bach Understands: Yes Disagree/Comply: Comply Care Advice Given Per Guideline CALL EMS 911 NOW: Immediate medical attention is needed. You need to hang up and call 911 (or an ambulance). Psychologist, forensic Discretion: I'll call you back in a few minutes to be sure you were able to reach them.) CARE ADVICE given per Breathing Difficulty (Adult) guideline. After Care Instructions Given Call  Event Type User Date / Time Description

## 2014-12-25 NOTE — Telephone Encounter (Signed)
[place] [facility] [place] [facility]  Patient Name: Pamela Morgan  DOB: 05/10/1946    Initial Comment Caller states she is having shortness of breath and her medication is not working.    Nurse Assessment  Nurse: Wynetta Emery, RN, Baker Janus Date/Time Eilene Ghazi Time): 12/25/2014 9:24:47 AM  Confirm and document reason for call. If symptomatic, describe symptoms. ---Pamela Morgan is having difficulty breathing and medications is not working neb tx not feeling coughing continues onset one month.  Has the patient traveled out of the country within the last 30 days? ---No  Does the patient require triage? ---Yes  Related visit to physician within the last 2 weeks? ---No  Does the PT have any chronic conditions? (i.e. diabetes, asthma, etc.) ---Yes  List chronic conditions. ---Asthma     Guidelines    Guideline Title Affirmed Question Affirmed Notes  Breathing Difficulty SEVERE difficulty breathing (e.g., struggling for each breath, speaks in single words)    Final Disposition User   Call EMS 911 Now Wynetta Emery, Therapist, sports, Baker Janus
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
## 2014-12-25 NOTE — Patient Instructions (Addendum)
    Albuterol is a rescue inhaler which should be used as infrequently as possible; it should never be used more than 1-2 puffs every 4 hours.   If it is required more than 2-3 times per week; the asthma is not well controlled. Symbicort , Dulera , Advair or other maintenance agent should be considered to control smooth muscle spasm and airway inflammation  and to prevent adverse effects from excess albuterol use.Those adverse effects can include health or life threatening heart rhythm irregularities.  Symbicort 160/4.5 mg two inhalations every 12 hours; gargle and spit after use. (sample [id number];Exp 2/17)    Your next office appointment will be determined based upon review of your pending labs AND   x-rays. Those instructions will be transmitted to you by mail. Critical values will be called. Followup as needed for any active or acute issue. Please report any significant change in your symptoms.

## 2014-12-25 NOTE — Progress Notes (Signed)
Pre visit review using our clinic review tool, if applicable. No additional management support is needed unless otherwise documented below in the visit note. 

## 2014-12-26 NOTE — Telephone Encounter (Signed)
Reviewed Pt seen as acute for same 12/25/14 thanks

## 2014-12-27 LAB — FRUCTOSAMINE: Fructosamine: 331 umol/L — ABNORMAL HIGH (ref 190–270)

## 2015-01-05 ENCOUNTER — Ambulatory Visit (INDEPENDENT_AMBULATORY_CARE_PROVIDER_SITE_OTHER): Payer: Medicare Other | Admitting: Internal Medicine

## 2015-01-05 ENCOUNTER — Encounter: Payer: Self-pay | Admitting: Internal Medicine

## 2015-01-05 VITALS — BP 126/74 | HR 55 | Ht 64.0 in | Wt 177.0 lb

## 2015-01-05 DIAGNOSIS — J4530 Mild persistent asthma, uncomplicated: Secondary | ICD-10-CM

## 2015-01-05 DIAGNOSIS — I10 Essential (primary) hypertension: Secondary | ICD-10-CM | POA: Diagnosis not present

## 2015-01-05 MED ORDER — FAMOTIDINE 20 MG PO TABS: ORAL_TABLET | ORAL | Status: DC

## 2015-01-05 MED ORDER — PANTOPRAZOLE SODIUM 40 MG PO TBEC: 40.0000 mg | DELAYED_RELEASE_TABLET | Freq: Every day | ORAL | Status: DC

## 2015-01-05 MED ORDER — VALSARTAN-HYDROCHLOROTHIAZIDE 160-25 MG PO TABS: 1.0000 | ORAL_TABLET | Freq: Every day | ORAL | Status: DC

## 2015-01-05 MED ORDER — BUDESONIDE-FORMOTEROL FUMARATE 80-4.5 MCG/ACT IN AERO: INHALATION_SPRAY | RESPIRATORY_TRACT | Status: DC

## 2015-01-05 NOTE — Progress Notes (Signed)
Subjective:    [name], female    DOB: April 08, 1946,    MRN: [id number]  HPI  75 yobm never smoker with dx of asthma in her 30s previously on shots some better but never completely  with chronic cough x decades worse x 2015 so referred by Dr Alwyn Ren to pulmonary clinic 01/05/2015    01/05/2015 1st [facility] office visit/ Pamela Morgan / maint rx with symbiocrt 160   Chief Complaint  Patient presents with  . Pulmonary Consult    Referred by Dr. Alwyn Ren. [name]. Cough is prod with minimal white sputum.  She states SOB "when I do alot of work" or when she lies down.   Cough is perennial,  worse day=  night more dry than wet and present x decades, only gets convincing response while on prednisone/ poor hfa, always immediately worse when lie down and better sitting up, better p saba. , no better on advair dpi  Seen by Dr Alwyn Ren 12/25/14 overusing saba and rec trial of symbicort 160 2bid "helps some" with noct cough but not daytime coughing.  No obvious other patterns in day to day or daytime variabilty or assoc   cp or chest tightness, subjective wheeze overt sinus or hb symptoms. No unusual exp hx or h/o childhood pna/ asthma or knowledge of premature birth.   Also denies any obvious fluctuation of symptoms with weather or environmental changes or other aggravating or alleviating factors except as outlined above   Current Medications, Allergies, Complete Past Medical History, Past Surgical History, Family History, and Social History were reviewed in [employer] record.          Review of Systems  Constitutional: Negative for fever, chills and unexpected weight change.  HENT: Positive for dental problem and sneezing. Negative for congestion, ear pain, nosebleeds, postnasal drip, rhinorrhea, sinus pressure, sore throat, trouble swallowing and voice change.   Eyes: Negative for visual disturbance.  Respiratory: Positive  for cough and shortness of breath. Negative for choking.   Cardiovascular: Negative for chest pain and leg swelling.  Gastrointestinal: Negative for vomiting, abdominal pain and diarrhea.  Genitourinary: Negative for difficulty urinating.  Musculoskeletal: Negative for arthralgias.  Skin: Negative for rash.  Neurological: Negative for tremors, syncope and headaches.  Hematological: Does not bruise/bleed easily.       Objective:   Physical Exam  amb bf raspy voice/ cough dry quality   Wt Readings from Last 3 Encounters:  01/05/15 177 lb (80.287 kg)  12/25/14 178 lb (80.74 kg)  11/17/14 180 lb (81.647 kg)    Vital signs reviewed   HEENT: nl dentition, turbinates, and orophanx. Nl external ear canals without cough reflex   NECK :  without JVD/Nodes/TM/ nl carotid upstrokes bilaterally   LUNGS: no acc muscle use, clear to A and P bilaterally without cough on insp or exp maneuvers   CV:  RRR  no s3 or murmur or increase in P2, no edema   ABD:  soft and nontender with nl excursion in the supine position. No bruits or organomegaly, bowel sounds nl  MS:  warm without deformities, calf tenderness, cyanosis or clubbing  SKIN: warm and dry without lesions    NEURO:  alert, approp, no deficits      I personally reviewed images and agree with radiology impression as follows:  CXR:  12/25/14 Lungs are mildly hyperexpanded. No edema or focal airspace consolidation. No pneumothorax or plural  effusion. Cardiopericardial silhouette is at upper limits of normal for size. Imaged bony structures of the thorax are intact.        Assessment & Plan:

## 2015-01-05 NOTE — Patient Instructions (Signed)
Symbicort 80 Take 2 puffs first thing in am and then another 2 puffs about 12 hours later.   Work on inhaler technique:  relax and gently blow all the way out then take a nice smooth deep breath back in, triggering the inhaler at same time you start breathing in.  Hold for up to 5 seconds if you can.  Rinse and gargle with water when done     Only use your albuterol as a rescue medication to be used if you can't catch your breath by resting or doing a relaxed purse lip breathing pattern.  - The less you use it, the better it will work when you need it. - Ok to use up to 2 puffs  every 4 hours if you must but call for immediate appointment if use goes up over your usual need - Don't leave home without it !!  (think of it like the spare tire for your car)   Stop hyzar and use valsartan 160 /25 one daily   Pantoprazole (protonix) 40 mg   Take 30-60 min before first meal of the day and Pepcid 20 mg one bedtime along chlortrimeton 4 mg (both are over the counter) until return to office - this is the best way to tell whether stomach acid is contributing to your problem.    GERD (REFLUX)  is an extremely common cause of respiratory symptoms just like yours , many times with no obvious heartburn at all.    It can be treated with medication, but also with lifestyle changes including avoidance of late meals, excessive alcohol, smoking cessation, and avoid fatty foods, chocolate, peppermint, colas, red wine, and acidic juices such as orange juice.  NO MINT OR MENTHOL PRODUCTS SO NO COUGH DROPS  USE SUGARLESS CANDY INSTEAD (Jolley ranchers or Stover's or Life Savers) or even ice chips will also do - the key is to swallow to prevent all throat clearing. NO OIL BASED VITAMINS - use powdered substitutes.  See Tammy NP 4 weeks with all your medications, even over the counter meds, separated in two separate bags, the ones you take no matter what vs the ones you stop once you feel better and take only as needed  when you feel you need them.   Tammy  will generate for you a new user friendly medication calendar that will put [place] all on the same page re: your medication use.     Without this process, it simply isn't possible to assure that we are providing  your outpatient care  with  the attention to detail we feel you deserve.   If we cannot assure that you're getting that kind of care,  then we cannot manage your problem effectively from this clinic.  Once you have seen Tammy and we are sure that we're all on the same page with your medication use she will arrange follow up with me.

## 2015-01-06 ENCOUNTER — Encounter: Payer: Self-pay | Admitting: Internal Medicine

## 2015-01-06 NOTE — Assessment & Plan Note (Addendum)
For reasons that may related to vascular permability and nitric oxide pathways but not elevated  bradykinin levels (as seen with  ACEi use) losartan in the generic form has been reported now from mulitple sources  to cause a similar pattern of non-specific  upper airway symptoms as seen with acei.   This has not been reported with exposure to the other ARB's to date, so it seems reasonable for now to try either generic diovan or avapro if ARB needed or use an alternative class altogether.  See:  Lelon Frohlich Allergy Asthma Immunol  2008: 101: p [phone]    Stop hyzar and use valsartan 160 /25 one daily

## 2015-01-06 NOTE — Assessment & Plan Note (Addendum)
Symptoms are markedly disproportionate to objective findings and not clear this is a lung problem but pt does appear to have difficult airway management issues.  ddx is between uacs and asthma but same issues involved in both:  DDX of  difficult airways management all start with A and  include Adherence, Ace Inhibitors, Acid Reflux, Active Sinus Disease, Alpha 1 Antitripsin deficiency, Anxiety masquerading as Airways dz,  ABPA,  allergy(esp in young), Aspiration (esp in elderly), Adverse effects of DPI,  Active smokers, plus two Bs  = Bronchiectasis and Beta blocker use..and one C= CHF   Adherence is always the initial "prime suspect" and is a multilayered concern that requires a "trust but verify" approach in every patient - starting with knowing how to use medications, especially inhalers, correctly, keeping up with refills and understanding the fundamental difference between maintenance and prns vs those medications only taken for a very short course and then stopped and not refilled.  The proper method of use, as well as anticipated side effects, of a metered-dose inhaler are discussed and demonstrated to the patient. Improved effectiveness after extensive coaching during this visit to a level of approximately  75% from a baseline of < 25% so may be able to help her with her symptoms using lower dose of symbicort with less irritation of her upper airway  ? Acid (or non-acid) GERD > always difficult to exclude as up to 75% of pts in some series report no assoc GI/ Heartburn symptoms> rec max (24h)  acid suppression and diet restrictions/ reviewed and instructions given in writing.   ? Allergy > reports previously on shots/ doubt they would help now but will re-eval this aspect on f/u ov  ? Adverse effects of dpi > avoid dpi   ? acei effects now being reported with losartan > try valsartan (see hbp)  In meantime,   Each maintenance medication was reviewed in detail including most importantly the  difference between maintenance and as needed and under what circumstances the prns are to be used.  Please see instructions for details which were reviewed in writing and the patient given a copy.

## 2015-01-11 ENCOUNTER — Encounter: Payer: Self-pay | Admitting: Internal Medicine

## 2015-01-11 ENCOUNTER — Ambulatory Visit (INDEPENDENT_AMBULATORY_CARE_PROVIDER_SITE_OTHER): Payer: Medicare Other | Admitting: Internal Medicine

## 2015-01-11 VITALS — BP 140/78 | HR 60 | Wt 177.2 lb

## 2015-01-11 DIAGNOSIS — E1165 Type 2 diabetes mellitus with hyperglycemia: Secondary | ICD-10-CM | POA: Diagnosis not present

## 2015-01-11 DIAGNOSIS — M545 Low back pain, unspecified: Secondary | ICD-10-CM

## 2015-01-11 DIAGNOSIS — IMO0002 Reserved for concepts with insufficient information to code with codable children: Secondary | ICD-10-CM

## 2015-01-11 DIAGNOSIS — I10 Essential (primary) hypertension: Secondary | ICD-10-CM | POA: Diagnosis not present

## 2015-01-11 DIAGNOSIS — Z23 Encounter for immunization: Secondary | ICD-10-CM | POA: Diagnosis not present

## 2015-01-11 MED ORDER — OLOPATADINE HCL 0.1 % OP SOLN: 1.0000 [drp] | Freq: Two times a day (BID) | OPHTHALMIC | Status: DC | PRN

## 2015-01-11 MED ORDER — BUDESONIDE-FORMOTEROL FUMARATE 80-4.5 MCG/ACT IN AERO: 2.0000 | INHALATION_SPRAY | Freq: Two times a day (BID) | RESPIRATORY_TRACT | Status: DC

## 2015-01-11 MED ORDER — HYDROCORTISONE ACETATE 25 MG RE SUPP: 25.0000 mg | Freq: Two times a day (BID) | RECTAL | Status: DC | PRN
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
## 2015-01-11 NOTE — Patient Instructions (Addendum)
It was good to see you today.  We have reviewed your prior records including labs and tests today  Prevnar 13 immunization administered today  Medications reviewed and updated, no changes recommended at this time. Refill on medication(s) as discussed today.  we'll make referral to opthomology (eye doctor). Our office will contact you regarding appointment(s) once made.  Please schedule followup in 6 months, call sooner if problems.   Diabetes and Standards of Medical Care Diabetes is complicated. You may find that your diabetes team includes a dietitian, nurse, diabetes educator, eye doctor, and more. To help everyone know what is going on and to help you get the care you deserve, the following schedule of care was developed to help keep you on track. Below are the tests, exams, vaccines, medicines, education, and plans you will need. HbA1c test This test shows how well you have controlled your glucose over the past 2-3 months. It is used to see if your diabetes management plan needs to be adjusted.   It is performed at least 2 times a year if you are meeting treatment goals.  It is performed 4 times a year if therapy has changed or if you are not meeting treatment goals. Blood pressure test  This test is performed at every routine medical visit. The goal is less than 140/90 mm Hg for most people, but 130/80 mm Hg in some cases. Ask your health care provider about your goal. Dental exam  Follow up with the dentist regularly. Eye exam  If you are diagnosed with type 1 diabetes as a child, get an exam upon reaching the age of 39 years or older and have had diabetes for 3-5 years. Yearly eye exams are recommended after that initial eye exam.  If you are diagnosed with type 1 diabetes as an adult, get an exam within 5 years of diagnosis and then yearly.  If you are diagnosed with type 2 diabetes, get an exam as soon as possible after the diagnosis and then yearly. Foot care  exam  Visual foot exams are performed at every routine medical visit. The exams check for cuts, injuries, or other problems with the feet.  A comprehensive foot exam should be done yearly. This includes visual inspection as well as assessing foot pulses and testing for loss of sensation.  Check your feet nightly for cuts, injuries, or other problems with your feet. Tell your health care provider if anything is not healing. Kidney function test (urine microalbumin)  This test is performed once a year.  Type 1 diabetes: The first test is performed 5 years after diagnosis.  Type 2 diabetes: The first test is performed at the time of diagnosis.  A serum creatinine and estimated glomerular filtration rate (eGFR) test is done once a year to assess the level of chronic kidney disease (CKD), if present. Lipid profile (cholesterol, HDL, LDL, triglycerides)  Performed every 5 years for most people.  The goal for LDL is less than 100 mg/dL. If you are at high risk, the goal is less than 70 mg/dL.  The goal for HDL is 40 mg/dL-50 mg/dL for men and 50 mg/dL-60 mg/dL for women. An HDL cholesterol of 60 mg/dL or higher gives some protection against heart disease.  The goal for triglycerides is less than 150 mg/dL. Influenza vaccine, pneumococcal vaccine, and hepatitis B vaccine  The influenza vaccine is recommended yearly.  It is recommended that people with diabetes who are over 36 years old get the pneumonia vaccine. In  some cases, two separate shots may be given. Ask your health care provider if your pneumonia vaccination is up to date.  The hepatitis B vaccine is also recommended for adults with diabetes. Diabetes self-management education  Education is recommended at diagnosis and ongoing as needed. Treatment plan  Your treatment plan is reviewed at every medical visit. Document Released: 08/03/2009 Document Revised: 02/20/2014 Document Reviewed: 03/08/2013 [facility] Patient Information  2015 [place], [place]. This information is not intended to replace advice given to you by your health care provider. Make sure you discuss any questions you have with your health care provider.
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
## 2015-01-11 NOTE — Assessment & Plan Note (Signed)
Lab Results  Component Value Date   HGBA1C 7.7* 12/25/2014  long hx noncompliance reviewed today, improved in last 6 mo historically complicated by multiple drug intol and allergies metaglip increased to twice daily August 2014 when A1c not goal, but noncompliant with same Declined to start Levemir summer 2015 Amaryl added to ongoing Januvia by endo eval [facility] Dee 06/12/14, and pt does not intend to follow there at this time The patient is asked to make an attempt to improve diet and exercise patterns to aid in medical management of this problem.

## 2015-01-11 NOTE — Progress Notes (Signed)
Pre visit review using our clinic review tool, if applicable. No additional management support is needed unless otherwise documented below in the visit note. 

## 2015-01-11 NOTE — Addendum Note (Signed)
Addended by: Earnstine Regal on: 01/11/2015 03:03 PM   Modules accepted: Orders

## 2015-01-11 NOTE — Assessment & Plan Note (Signed)
BP Readings from Last 3 Encounters:  01/11/15 140/78  01/05/15 126/74  12/25/14 140/86   subop control, chronic Reports well controlled at home Increased amlodipine to 5 mg daily 05/2013 continue max ARB/hct, changed from losartan to valsartan 12/2014 by pulm for concern of cough Avoid BB due to bradycardia (but asymptomatic presently)

## 2015-02-06 ENCOUNTER — Encounter: Payer: Self-pay | Admitting: Adult Health

## 2015-02-06 ENCOUNTER — Telehealth: Payer: Self-pay | Admitting: Internal Medicine

## 2015-02-06 ENCOUNTER — Ambulatory Visit (INDEPENDENT_AMBULATORY_CARE_PROVIDER_SITE_OTHER): Payer: Medicare Other | Admitting: Adult Health

## 2015-02-06 VITALS — BP 160/80 | HR 54 | Temp 98.0°F | Ht 64.0 in | Wt 184.4 lb

## 2015-02-06 DIAGNOSIS — R05 Cough: Secondary | ICD-10-CM | POA: Diagnosis not present

## 2015-02-06 DIAGNOSIS — I10 Essential (primary) hypertension: Secondary | ICD-10-CM | POA: Diagnosis not present

## 2015-02-06 DIAGNOSIS — J4530 Mild persistent asthma, uncomplicated: Secondary | ICD-10-CM | POA: Diagnosis not present

## 2015-02-06 DIAGNOSIS — R059 Cough, unspecified: Secondary | ICD-10-CM

## 2015-02-06 MED ORDER — SITAGLIPTIN PHOSPHATE 100 MG PO TABS: 100.0000 mg | ORAL_TABLET | Freq: Every day | ORAL | Status: DC

## 2015-02-06 MED ORDER — BUDESONIDE-FORMOTEROL FUMARATE 80-4.5 MCG/ACT IN AERO: INHALATION_SPRAY | RESPIRATORY_TRACT | Status: DC

## 2015-02-06 MED ORDER — VALSARTAN-HYDROCHLOROTHIAZIDE 160-25 MG PO TABS: 1.0000 | ORAL_TABLET | Freq: Every day | ORAL | Status: DC

## 2015-02-06 NOTE — Assessment & Plan Note (Signed)
Recent flare with chronic cough seems to be improved off Advair and Hyzaar.  Needs PFT  Patient's medications were reviewed today and patient education was given. Computerized medication calendar was adjusted/completed   Plan  Follow med calendar closely and bring to each visit Follow with Dr. Melvyn Novas in 6 weeks with pulmonary function test Please contact office for sooner follow up if symptoms do not improve or worsen or seek emergency care

## 2015-02-06 NOTE — Progress Notes (Signed)
Subjective:    [name], female    DOB: 02/08/1946,    MRN: [id number]  HPI  27 yobm never smoker with dx of asthma in her 60s previously on shots some better but never completely  with chronic cough x decades worse x 2015 so referred by Dr Linna Darner to pulmonary clinic 01/05/2015    01/05/2015 1st [facility] office visit/ Wert / maint rx with symbiocrt 160   Chief Complaint  Patient presents with  . Pulmonary Consult    Referred by Dr. Linna Darner. [name]. Cough is prod with minimal white sputum.  She states SOB "when I do alot of work" or when she lies down.   Cough is perennial,  worse day=  night more dry than wet and present x decades, only gets convincing response while on prednisone/ poor hfa, always immediately worse when lie down and better sitting up, better p saba. , no better on advair dpi  Seen by Dr Linna Darner 12/25/14 overusing saba and rec trial of symbicort 160 2bid "helps some" with noct cough but not daytime coughing. >D/c Advair.>Symbicort rx , change hyzaar to valsartan hctz  02/06/2015 Follow up : Asthma/Chronic Cough  Patient returns for 1 month follow-up and medication review. We reviewed all her medications organized them into a medication calendar  with patient education. Appears to be taking her medications correctly. Although she did not bring several of her meds as she is waiting on rx from [place] system.  Patient was seen last month for initial pulmonary consultation for difficult to control asthma and cough She was changed off of Hyzaar to Diovan HCTZ. And started on Symbicort inhaler. She says she is feeling improved with decreased cough and shortness of breath.. She denies any hemoptysis, orthopnea, PND, leg swelling. B/p is up today says she is out of her b/p meds, waiting on shipment from [place] system.  She did not go get refill of Diovan , we discussed going to pharmacy for this.  She denies any chest pain,  orthopnea, PND or leg swelling.        Current Medications, Allergies, Complete Past Medical History, Past Surgical History, Family History, and Social History were reviewed in [employer] record.          Review of Systems  Constitutional: Negative for fever, chills and unexpected weight change.  HENT: Negative for congestion, ear pain, nosebleeds, postnasal drip, rhinorrhea, sinus pressure, sore throat, trouble swallowing and voice change.   Eyes: Negative for visual disturbance.  Respiratory: Positive for cough . Negative for choking.   Cardiovascular: Negative for chest pain and leg swelling.  Gastrointestinal: Negative for vomiting, abdominal pain and diarrhea.  Genitourinary: Negative for difficulty urinating.  Musculoskeletal: Negative for arthralgias.  Skin: Negative for rash.  Neurological: Negative for tremors, syncope and headaches.  Hematological: Does not bruise/bleed easily.       Objective:   Physical Exam  amb bf   Vital signs reviewed   HEENT: nl dentition, turbinates, and orophanx. Nl external ear canals without cough reflex   NECK :  without JVD/Nodes/TM/ nl carotid upstrokes bilaterally   LUNGS: no acc muscle use, clear to A and P bilaterally without cough on insp or exp maneuvers   CV:  RRR  no s3 or murmur or increase in P2, no edema   ABD:  soft and nontender with nl excursion in the supine position. No bruits or organomegaly, bowel  sounds nl  MS:  warm without deformities, calf tenderness, cyanosis or clubbing  SKIN: warm and dry without lesions    NEURO:  alert, approp, no deficits      I personally reviewed images and agree with radiology impression as follows:  CXR:  12/25/14 Lungs are mildly hyperexpanded. No edema or focal airspace consolidation. No pneumothorax or plural effusion. Cardiopericardial silhouette is at upper limits of normal for size. Imaged bony structures of the thorax are intact.         Assessment & Plan:

## 2015-02-06 NOTE — Addendum Note (Signed)
Addended by: Parke Poisson E on: 02/06/2015 12:42 PM   Modules accepted: Orders, Medications

## 2015-02-06 NOTE — Telephone Encounter (Signed)
Patient is requesting Januvia to be sent to mail order for [facility]

## 2015-02-06 NOTE — Patient Instructions (Signed)
Follow med calendar closely and bring to each visit Follow with Dr. Melvyn Novas in 6 weeks with pulmonary function test Please contact office for sooner follow up if symptoms do not improve or worsen or seek emergency care

## 2015-02-06 NOTE — Assessment & Plan Note (Addendum)
Not at goal needs to fill rx  Low salt diet Pt to refill rx at pharmacy as directed.  Follow up with PCP for b/p management .

## 2015-02-06 NOTE — Telephone Encounter (Signed)
erx done

## 2015-02-08 ENCOUNTER — Encounter: Payer: Self-pay | Admitting: Internal Medicine

## 2015-02-20 ENCOUNTER — Telehealth: Payer: Self-pay | Admitting: *Deleted

## 2015-02-20 NOTE — Telephone Encounter (Signed)
-----   Message from Hendricks Limes, MD sent at 02/20/2015  6:59 AM EDT ----- Not overdue ; done @ her Gyn in 5/15  ----- Message -----    From: SYSTEM    Sent: 02/20/2015  12:05 AM      To: Hendricks Limes, MD

## 2015-03-01 DIAGNOSIS — M5416 Radiculopathy, lumbar region: Secondary | ICD-10-CM | POA: Diagnosis not present

## 2015-03-06 DIAGNOSIS — H2513 Age-related nuclear cataract, bilateral: Secondary | ICD-10-CM | POA: Diagnosis not present

## 2015-03-06 DIAGNOSIS — H40013 Open angle with borderline findings, low risk, bilateral: Secondary | ICD-10-CM | POA: Diagnosis not present

## 2015-03-06 DIAGNOSIS — H25013 Cortical age-related cataract, bilateral: Secondary | ICD-10-CM | POA: Diagnosis not present

## 2015-03-06 DIAGNOSIS — E11319 Type 2 diabetes mellitus with unspecified diabetic retinopathy without macular edema: Secondary | ICD-10-CM | POA: Diagnosis not present

## 2015-03-06 DIAGNOSIS — H35033 Hypertensive retinopathy, bilateral: Secondary | ICD-10-CM | POA: Diagnosis not present

## 2015-03-06 LAB — HM DIABETES EYE EXAM

## 2015-03-09 DIAGNOSIS — M5416 Radiculopathy, lumbar region: Secondary | ICD-10-CM | POA: Diagnosis not present

## 2015-03-15 DIAGNOSIS — M5416 Radiculopathy, lumbar region: Secondary | ICD-10-CM | POA: Diagnosis not present

## 2015-03-15 DIAGNOSIS — M5136 Other intervertebral disc degeneration, lumbar region: Secondary | ICD-10-CM | POA: Diagnosis not present

## 2015-03-20 ENCOUNTER — Ambulatory Visit (INDEPENDENT_AMBULATORY_CARE_PROVIDER_SITE_OTHER): Payer: Medicare Other | Admitting: Internal Medicine

## 2015-03-20 ENCOUNTER — Encounter: Payer: Self-pay | Admitting: Internal Medicine

## 2015-03-20 VITALS — BP 152/68 | HR 58 | Ht 64.0 in | Wt 186.0 lb

## 2015-03-20 DIAGNOSIS — I10 Essential (primary) hypertension: Secondary | ICD-10-CM

## 2015-03-20 DIAGNOSIS — J4530 Mild persistent asthma, uncomplicated: Secondary | ICD-10-CM

## 2015-03-20 DIAGNOSIS — R059 Cough, unspecified: Secondary | ICD-10-CM

## 2015-03-20 DIAGNOSIS — R05 Cough: Secondary | ICD-10-CM

## 2015-03-20 LAB — PULMONARY FUNCTION TEST
DL/VA % pred: 106 %
DL/VA: 5.14 ml/min/mmHg/L
DLCO unc % pred: 173 %
DLCO unc: 42.14 ml/min/mmHg
FEF 25-75 PRE: 1.32 L/s
FEF 25-75 Post: 1.29 L/sec
FEF2575-%CHANGE-POST: -2 %
FEF2575-%PRED-POST: 73 %
FEF2575-%Pred-Pre: 75 %
FEV1-%Change-Post: 2 %
FEV1-%PRED-PRE: 80 %
FEV1-%Pred-Post: 83 %
FEV1-POST: 1.56 L
FEV1-PRE: 1.52 L
FEV1FVC-%CHANGE-POST: 0 %
FEV1FVC-%PRED-PRE: 98 %
FEV6-%Change-Post: 4 %
FEV6-%PRED-POST: 87 %
FEV6-%Pred-Pre: 83 %
FEV6-PRE: 1.95 L
FEV6-Post: 2.04 L
FEV6FVC-%Change-Post: 1 %
FEV6FVC-%PRED-PRE: 102 %
FEV6FVC-%Pred-Post: 104 %
FVC-%Change-Post: 3 %
FVC-%Pred-Post: 84 %
FVC-%Pred-Pre: 81 %
FVC-POST: 2.04 L
FVC-Pre: 1.97 L
POST FEV1/FVC RATIO: 76 %
POST FEV6/FVC RATIO: 100 %
Pre FEV1/FVC ratio: 77 %
Pre FEV6/FVC Ratio: 99 %
RV % pred: 146 %
RV: 3.17 L
TLC % pred: 109 %
TLC: 5.55 L

## 2015-03-20 NOTE — Progress Notes (Signed)
Subjective:    [name], female    DOB: June 03, 1946,    MRN: [id number]    Brief patient profile:  2 yobm never smoker with dx of asthma in her 30s previously on shots some better but never completely  with chronic cough x decades worse x 2015 so referred by Dr Alwyn Ren to pulmonary clinic 01/05/2015    01/05/2015 1st [facility] office visit/ Pamela Morgan / maint rx with symbiocrt 160   Chief Complaint  Patient presents with  . Pulmonary Consult    Referred by Dr. Alwyn Ren. [name]. Cough is prod with minimal white sputum.  She states SOB "when I do alot of work" or when she lies down.   Cough is perennial,  worse day=  night more dry than wet and present x decades, only gets convincing response while on prednisone/ poor hfa, always immediately worse when lie down and better sitting up, better p saba. , no better on advair dpi  Seen by Dr Alwyn Ren 12/25/14 overusing saba and rec trial of symbicort 160 2bid "helps some" with noct cough but not daytime coughing. > Symbicort 80 Take 2 puffs first thing in am and then another 2 puffs about 12 hours later.  Work on inhaler technique:  relax and gently blow all the way out   Only use your albuterol as a rescue medication  Stop hyzar and use valsartan 160 /25 one daily  Pantoprazole (protonix) 40 mg   Take 30-60 min before first meal of the day and Pepcid 20 mg one      02/06/2015 NP Follow up : Asthma/Chronic Cough  Patient returns for 1 month follow-up and medication review. We reviewed all her medications organized them into a medication calendar  with patient education. Appears to be taking her medications correctly. Although she did not bring several of her meds as she is waiting on rx from [place] system.  Patient was seen last month for initial pulmonary consultation for difficult to control asthma and cough She was changed off of Hyzaar to Diovan HCTZ. And started on Symbicort inhaler. She  says she is feeling improved with decreased cough and shortness of breath.. She denies any hemoptysis, orthopnea, PND, leg swelling. B/p is up today says she is out of her b/p meds, waiting on shipment from [place] [facility].  She did not go get refill of Diovan , we discussed going to pharmacy for this.  She denies any chest pain, orthopnea, PND or leg swelling. 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 Follow-up    PFT done today. Pt states her breathing is doing well. She has only used albuterol x 1 since the last visit.      Not limited by breathing from desired activities    No obvious day to day or daytime variabilty or assoc chronic cough or cp or chest tightness, subjective wheeze overt sinus or hb symptoms. No unusual exp hx or h/o childhood pna/ asthma or knowledge of premature birth.  Sleeping ok without nocturnal  or early am exacerbation  of respiratory  c/o's or need for noct saba. Also denies any obvious fluctuation of symptoms with weather or environmental changes or other aggravating or alleviating factors except as outlined above  Current Medications, Allergies, Complete Past Medical History, Past Surgical History, Family History, and Social History were reviewed in [employer] record.  ROS  The following are not active complaints unless bolded sore throat, dysphagia, dental problems, itching, sneezing,  nasal congestion or excess/ purulent secretions, ear ache,   fever, chills, sweats, unintended wt loss, pleuritic or exertional cp, hemoptysis,  orthopnea pnd or leg swelling, presyncope, palpitations, heartburn, abdominal pain, anorexia, nausea, vomiting, diarrhea  or change in bowel or urinary habits, change in stools or urine, dysuria,hematuria,  rash, arthralgias, visual complaints,  headache, numbness weakness or ataxia or problems with walking or coordination,  change in mood/affect or memory.       Objective:   Physical Exam  amb bf nad   Wt Readings from Last 3 Encounters:  03/20/15 186 lb (84.369 kg)  02/06/15 184 lb 6.4 oz (83.643 kg)  01/11/15 177 lb 4 oz (80.4 kg)    Vital signs reviewed     HEENT: nl dentition, turbinates, and orophanx. Nl external ear canals without cough reflex   NECK :  without JVD/Nodes/TM/ nl carotid upstrokes bilaterally   LUNGS: no acc muscle use, clear to A and P bilaterally without cough on insp or exp maneuvers   CV:  RRR  no s3 or murmur or increase in P2, no edema   ABD:  soft and nontender with nl excursion in the supine position. No bruits or organomegaly, bowel sounds nl  MS:  warm without deformities, calf tenderness, cyanosis or clubbing  SKIN: warm and dry without lesions    NEURO:  alert, approp, no deficits      I personally reviewed images and agree with radiology impression as follows:  CXR:  12/25/14 Lungs are mildly hyperexpanded. No edema or focal airspace consolidation. No pneumothorax or plural effusion. Cardiopericardial silhouette is at upper limits of normal for size. Imaged bony structures of the thorax are intact.        Assessment & Plan:

## 2015-03-20 NOTE — Progress Notes (Signed)
PFT performed today. 

## 2015-03-20 NOTE — Assessment & Plan Note (Signed)
4/29/ 2009 [facility] eval, nl pfts 01/05/2015 p extensive coaching HFA effectiveness =    75% > try symbicort 80 2bid 02/06/2015 med calendar  - PFTs wnl p am symbicort   03/20/2015 p extensive coaching HFA effectiveness =    75%   I had an extended discussion with the patient reviewing all relevant studies completed to date and  lasting 15 to 20 minutes of a 25 minute visit on the following ongoing concerns:    1)  Despite poor hfa doing great on present rx which calls into questions whether she has asthma at all but could very well have cough variant asthma so no change rx x 3 m as doing so much better  2) still struggling with concept of med rec/ [facility] not [facility].     Each maintenance medication was reviewed in detail including most importantly the difference between maintenance and as needed and under what circumstances the prns are to be used. This was done in the context of a medication calendar review which provided the patient with a user-friendly unambiguous mechanism for medication administration and reconciliation and provides an action plan for all active problems. It is critical that this be shown to every doctor  for modification during the office visit if necessary so the patient can use it as a working document.      3) See instructions for specific recommendations which were reviewed directly with the patient who was given a copy with highlighter outlining the key components.

## 2015-03-20 NOTE — Patient Instructions (Signed)
Work on inhaler technique:  relax and gently blow all the way out then take a nice smooth deep breath back in, triggering the inhaler at same time you start breathing in.  Hold for up to 5 seconds if you can.  Blow out through nose. Rinse and gargle with water when done  See calendar for specific medication instructions and bring it back for each and every office visit for every healthcare provider you see.  Without it,  you may not receive the best quality medical care that we feel you deserve.  You will note that the calendar groups together  your maintenance  medications that are timed at particular times of the day.  Think of this as your checklist for what your doctor has instructed you to do until your next evaluation to see what benefit  there is  to staying on a consistent group of medications intended to keep you well.  The other group at the bottom is entirely up to you to use as you see fit  for specific symptoms that may arise between visits that require you to treat them on an as needed basis.  Think of this as your action plan or "what if" list.   Separating the top medications from the bottom group is fundamental to providing you adequate care going forward.    Call us if there's a medication not listed correctly on your calendar   Please schedule a follow up visit in 3 months but call sooner if needed

## 2015-03-20 NOTE — Assessment & Plan Note (Signed)
Changed losartan to valsartan 01/05/15 due to concern with cough   Adequate control on present rx, reviewed > no change in rx needed

## 2015-03-26 ENCOUNTER — Encounter: Payer: Self-pay | Admitting: Gynecology

## 2015-03-26 ENCOUNTER — Ambulatory Visit (INDEPENDENT_AMBULATORY_CARE_PROVIDER_SITE_OTHER): Payer: Medicare Other | Admitting: Gynecology

## 2015-03-26 ENCOUNTER — Other Ambulatory Visit: Payer: Self-pay | Admitting: Gynecology

## 2015-03-26 VITALS — BP 125/80 | Ht 63.0 in | Wt 182.0 lb

## 2015-03-26 DIAGNOSIS — N6081 Other benign mammary dysplasias of right breast: Secondary | ICD-10-CM

## 2015-03-26 DIAGNOSIS — N952 Postmenopausal atrophic vaginitis: Secondary | ICD-10-CM

## 2015-03-26 DIAGNOSIS — Z01419 Encounter for gynecological examination (general) (routine) without abnormal findings: Secondary | ICD-10-CM | POA: Diagnosis not present

## 2015-03-26 DIAGNOSIS — Z1231 Encounter for screening mammogram for malignant neoplasm of breast: Secondary | ICD-10-CM

## 2015-03-26 NOTE — Patient Instructions (Signed)
Call to Schedule your mammogram  Facilities in [place]: 1)  [facility], [address] Phone: [phone] 2)  [facility]. [address] [place] Phone: [phone] 3)  Dr. Isaiah Blakes at [facility] [address] Phone: [phone]     Mammogram A mammogram is an X-ray test to find changes in a woman's breast. You should get a mammogram if:  You are 68 years of age or older  You have risk factors.   Your doctor recommends that you have one.  BEFORE THE TEST  Do not schedule the test the week before your period, especially if your breasts are sore during this time.  On the day of your mammogram:  Wash your breasts and armpits well. After washing, do not put on any deodorant or talcum powder on until after your test.   Eat and drink as you usually do.   Take your medicines as usual.   If you are diabetic and take insulin, make sure you:   Eat before coming for your test.   Take your insulin as usual.   If you cannot keep your appointment, call before the appointment to cancel. Schedule another appointment.  TEST  You will need to undress from the waist up. You will put on a hospital gown.   Your breast will be put on the mammogram machine, and it will press firmly on your breast with a piece of plastic called a compression paddle. This will make your breast flatter so that the machine can X-ray all parts of your breast.   Both breasts will be X-rayed. Each breast will be X-rayed from above and from the side. An X-ray might need to be taken again if the picture is not good enough.   The mammogram will last about 15 to 30 minutes.  AFTER THE TEST Finding out the results of your test Ask when your test results will be ready. Make sure you get your test results.  Document Released: 01/02/2009 Document Revised: 09/25/2011 Document Reviewed: 01/02/2009 [facility] Patient  Information 2012 [place].  You may obtain a copy of any labs that were done today by logging onto MyChart as outlined in the instructions provided with your AVS (after visit summary). The office will not call with normal lab results but certainly if there are any significant abnormalities then we will contact you.   Health Maintenance, Female A healthy lifestyle and preventative care can promote health and wellness.  Maintain regular health, dental, and eye exams.  Eat a healthy diet. Foods like vegetables, fruits, whole grains, low-fat dairy products, and lean protein foods contain the nutrients you need without too many calories. Decrease your intake of foods high in solid fats, added sugars, and salt. Get information about a proper diet from your caregiver, if necessary.  Regular physical exercise is one of the most important things you can do for your health. Most adults should get at least 150 minutes of moderate-intensity exercise (any activity that increases your heart rate and causes you to sweat) each week. In addition, most adults need muscle-strengthening exercises on 2 or more days a week.   Maintain a healthy weight. The body mass index (BMI) is a screening tool to identify possible weight problems. It provides an estimate of body fat based on height and weight. Your caregiver can help determine your BMI, and can help you achieve or maintain a healthy weight. For adults  20 years and older:  A BMI below 18.5 is considered underweight.  A BMI of 18.5 to 24.9 is normal.  A BMI of 25 to 29.9 is considered overweight.  A BMI of 30 and above is considered obese.  Maintain normal blood lipids and cholesterol by exercising and minimizing your intake of saturated fat. Eat a balanced diet with plenty of fruits and vegetables. Blood tests for lipids and cholesterol should begin at age 20 and be repeated every 5 years. If your lipid or cholesterol levels are high, you are over 50, or  you are a high risk for heart disease, you may need your cholesterol levels checked more frequently.Ongoing high lipid and cholesterol levels should be treated with medicines if diet and exercise are not effective.  If you smoke, find out from your caregiver how to quit. If you do not use tobacco, do not start.  Lung cancer screening is recommended for adults aged 55 80 years who are at high risk for developing lung cancer because of a history of smoking. Yearly low-dose computed tomography (CT) is recommended for people who have at least a 30-pack-year history of smoking and are a current smoker or have quit within the past 15 years. A pack year of smoking is smoking an average of 1 pack of cigarettes a day for 1 year (for example: 1 pack a day for 30 years or 2 packs a day for 15 years). Yearly screening should continue until the smoker has stopped smoking for at least 15 years. Yearly screening should also be stopped for people who develop a health problem that would prevent them from having lung cancer treatment.  If you are pregnant, do not drink alcohol. If you are breastfeeding, be very cautious about drinking alcohol. If you are not pregnant and choose to drink alcohol, do not exceed 1 drink per day. One drink is considered to be 12 ounces (355 mL) of beer, 5 ounces (148 mL) of wine, or 1.5 ounces (44 mL) of liquor.  Avoid use of street drugs. Do not share needles with anyone. Ask for help if you need support or instructions about stopping the use of drugs.  High blood pressure causes heart disease and increases the risk of stroke. Blood pressure should be checked at least every 1 to 2 years. Ongoing high blood pressure should be treated with medicines, if weight loss and exercise are not effective.  If you are 55 to 68 years old, ask your caregiver if you should take aspirin to prevent strokes.  Diabetes screening involves taking a blood sample to check your fasting blood sugar level. This  should be done once every 3 years, after age 45, if you are within normal weight and without risk factors for diabetes. Testing should be considered at a younger age or be carried out more frequently if you are overweight and have at least 1 risk factor for diabetes.  Breast cancer screening is essential preventative care for women. You should practice "breast self-awareness." This means understanding the normal appearance and feel of your breasts and may include breast self-examination. Any changes detected, no matter how small, should be reported to a caregiver. Women in their 20s and 30s should have a clinical breast exam (CBE) by a caregiver as part of a regular health exam every 1 to 3 years. After age 40, women should have a CBE every year. Starting at age 40, women should consider having a mammogram (breast X-ray) every year. Women who have a   family history of breast cancer should talk to their caregiver about genetic screening. Women at a high risk of breast cancer should talk to their caregiver about having an MRI and a mammogram every year.  Breast cancer gene (BRCA)-related cancer risk assessment is recommended for women who have family members with BRCA-related cancers. BRCA-related cancers include breast, ovarian, tubal, and peritoneal cancers. Having family members with these cancers may be associated with an increased risk for harmful changes (mutations) in the breast cancer genes BRCA1 and BRCA2. Results of the assessment will determine the need for genetic counseling and BRCA1 and BRCA2 testing.  The Pap test is a screening test for cervical cancer. Women should have a Pap test starting at age 33. Between ages 75 and 14, Pap tests should be repeated every 2 years. Beginning at age 4, you should have a Pap test every 3 years as long as the past 3 Pap tests have been normal. If you had a hysterectomy for a problem that was not cancer or a condition that could lead to cancer, then you no longer  need Pap tests. If you are between ages 72 and 12, and you have had normal Pap tests going back 10 years, you no longer need Pap tests. If you have had past treatment for cervical cancer or a condition that could lead to cancer, you need Pap tests and screening for cancer for at least 20 years after your treatment. If Pap tests have been discontinued, risk factors (such as a new sexual partner) need to be reassessed to determine if screening should be resumed. Some women have medical problems that increase the chance of getting cervical cancer. In these cases, your caregiver may recommend more frequent screening and Pap tests.  The human papillomavirus (HPV) test is an additional test that may be used for cervical cancer screening. The HPV test looks for the virus that can cause the cell changes on the cervix. The cells collected during the Pap test can be tested for HPV. The HPV test could be used to screen women aged 84 years and older, and should be used in women of any age who have unclear Pap test results. After the age of 73, women should have HPV testing at the same frequency as a Pap test.  Colorectal cancer can be detected and often prevented. Most routine colorectal cancer screening begins at the age of 56 and continues through age 76. However, your caregiver may recommend screening at an earlier age if you have risk factors for colon cancer. On a yearly basis, your caregiver may provide home test kits to check for hidden blood in the stool. Use of a small camera at the end of a tube, to directly examine the colon (sigmoidoscopy or colonoscopy), can detect the earliest forms of colorectal cancer. Talk to your caregiver about this at age 20, when routine screening begins. Direct examination of the colon should be repeated every 5 to 10 years through age 83, unless early forms of pre-cancerous polyps or small growths are found.  Hepatitis C blood testing is recommended for all people born from [birth year]  through 1965 and any individual with known risks for hepatitis C.  Practice safe sex. Use condoms and avoid high-risk sexual practices to reduce the spread of sexually transmitted infections (STIs). Sexually active women aged 28 and younger should be checked for Chlamydia, which is a common sexually transmitted infection. Older women with new or multiple partners should also be tested for Chlamydia. Testing for  other STIs is recommended if you are sexually active and at increased risk.  Osteoporosis is a disease in which the bones lose minerals and strength with aging. This can result in serious bone fractures. The risk of osteoporosis can be identified using a bone density scan. Women ages 35 and over and women at risk for fractures or osteoporosis should discuss screening with their caregivers. Ask your caregiver whether you should be taking a calcium supplement or vitamin D to reduce the rate of osteoporosis.  Menopause can be associated with physical symptoms and risks. Hormone replacement therapy is available to decrease symptoms and risks. You should talk to your caregiver about whether hormone replacement therapy is right for you.  Use sunscreen. Apply sunscreen liberally and repeatedly throughout the day. You should seek shade when your shadow is shorter than you. Protect yourself by wearing long sleeves, pants, a wide-brimmed hat, and sunglasses year round, whenever you are outdoors.  Notify your caregiver of new moles or changes in moles, especially if there is a change in shape or color. Also notify your caregiver if a mole is larger than the size of a pencil eraser.  Stay current with your immunizations. Document Released: 04/21/2011 Document Revised: 01/31/2013 Document Reviewed: 04/21/2011 [facility] Patient Information 2014 [place].

## 2015-03-26 NOTE — Progress Notes (Signed)
Pamela Morgan Nov 11, 1945 [id number]        69 y.o.  G2P2 for breast and pelvic exam.  Past medical history,surgical history, problem list, medications, allergies, family history and social history were all reviewed and documented as reviewed in the EPIC chart.  ROS:  Performed with pertinent positives and negatives included in the history, assessment and plan.   Additional significant findings :  none   Exam: Administrator, [employer] Vitals:   03/26/15 1358  BP: 125/80  Height: 5\' 3"  (1.6 m)  Weight: 182 lb (82.555 kg)   General appearance:  Normal affect, orientation and appearance. Skin: Grossly normal HEENT: Without gross lesions.  No cervical or supraclavicular adenopathy. Thyroid normal.  Lungs:  Clear without wheezing, rales or rhonchi Cardiac: RR, without RMG Abdominal:  Soft, nontender, without masses, guarding, rebound, organomegaly or hernia Breasts:  Examined lying and sitting without masses, retractions, discharge or axillary adenopathy.  Small classic sebaceous cyst right breast periphery at 2:00 position. Pelvic:  Ext/BUS/vagina with atrophic changes  Adnexa  Without masses or tenderness    Anus and perineum  Normal   Rectovaginal  Normal sphincter tone without palpated masses or tenderness.    Assessment/Plan:  69 y.o. G2P2 female for breast and pelvic exam.   1. Small sebaceous cyst right breast. Classic in appearance. Recommended heat and then draining. Follow up if enlarges or becomes tender. 2. Postmenopausal/atrophic genital changes. Status post TAH RSO in the past for bleeding. Doing well without significant hot flushes, night sweats, vaginal dryness. 3. Pap smear 2012. No Pap smear done today. She is comfortable with stop screening current recommendations based on her age and hysterectomy for benign indications. 4. Mammography 2014. Patient reminded she is overdue and recommended that she schedule. Patient agrees to do so. SBE monthly reviewed. 5. DEXA 2015  normal. Plan repeat DEXA at 5 year interval. 6. Colonoscopy 2009. Repeat at their recommended interval. 7. Health maintenance. No routine lab work done as this is done at her primary physician's office. Follow up in one year, sooner as needed.     Anastasio Auerbach MD, 2:19 PM 03/26/2015

## 2015-03-27 ENCOUNTER — Ambulatory Visit (HOSPITAL_COMMUNITY)
Admission: RE | Admit: 2015-03-27 | Discharge: 2015-03-27 | Disposition: A | Discharge status: Home / Self Care | Payer: Medicare Other | Source: Ambulatory Visit | Attending: Gynecology | Admitting: Gynecology

## 2015-03-27 DIAGNOSIS — Z1231 Encounter for screening mammogram for malignant neoplasm of breast: Secondary | ICD-10-CM | POA: Diagnosis not present

## 2015-03-27 LAB — URINALYSIS W MICROSCOPIC + REFLEX CULTURE
BACTERIA UA: NONE SEEN
BILIRUBIN URINE: NEGATIVE
CASTS: NONE SEEN
Crystals: NONE SEEN
Glucose, UA: >1000 mg/dL — AB
Hgb urine dipstick: NEGATIVE
Ketones, ur: NEGATIVE mg/dL
Leukocytes, UA: NEGATIVE
NITRITE: NEGATIVE
PROTEIN: NEGATIVE mg/dL
Specific Gravity, Urine: >1.03 — ABNORMAL HIGH (ref 1.005–1.030)
Urobilinogen, UA: 0.2 mg/dL (ref 0.0–1.0)
pH: 5 (ref 5.0–8.0)

## 2015-03-28 ENCOUNTER — Telehealth: Payer: Self-pay | Admitting: Internal Medicine

## 2015-03-28 NOTE — Telephone Encounter (Signed)
Is Dr. Asa Lente the only one that can sign this.

## 2015-03-28 NOTE — Telephone Encounter (Signed)
Patient called wanting to know if you received the fax for surgery form for PCP. Please advise patient

## 2015-03-28 NOTE — Telephone Encounter (Signed)
Unless pt wants an appt specifically for surgical clearance.

## 2015-03-28 NOTE — Telephone Encounter (Signed)
Patient advised.

## 2015-03-28 NOTE — Telephone Encounter (Signed)
PCP is out of town til end of June.  Surgery clearance form received.

## 2015-04-03 ENCOUNTER — Ambulatory Visit (INDEPENDENT_AMBULATORY_CARE_PROVIDER_SITE_OTHER): Payer: Medicare Other | Admitting: Internal Medicine

## 2015-04-03 ENCOUNTER — Encounter: Payer: Self-pay | Admitting: Internal Medicine

## 2015-04-03 VITALS — BP 184/78 | HR 50 | Temp 97.7°F | Resp 18 | Wt 183.0 lb

## 2015-04-03 DIAGNOSIS — I499 Cardiac arrhythmia, unspecified: Secondary | ICD-10-CM | POA: Diagnosis not present

## 2015-04-03 DIAGNOSIS — J4530 Mild persistent asthma, uncomplicated: Secondary | ICD-10-CM

## 2015-04-03 DIAGNOSIS — J209 Acute bronchitis, unspecified: Secondary | ICD-10-CM | POA: Diagnosis not present

## 2015-04-03 DIAGNOSIS — I10 Essential (primary) hypertension: Secondary | ICD-10-CM

## 2015-04-03 MED ORDER — AMOXICILLIN 500 MG PO CAPS: 500.0000 mg | ORAL_CAPSULE | Freq: Three times a day (TID) | ORAL | Status: DC

## 2015-04-03 MED ORDER — BENZONATATE 100 MG PO CAPS: 100.0000 mg | ORAL_CAPSULE | Freq: Three times a day (TID) | ORAL | Status: DC | PRN

## 2015-04-03 MED ORDER — PREDNISONE 10 MG PO TABS: ORAL_TABLET | ORAL | Status: DC

## 2015-04-03 NOTE — Patient Instructions (Addendum)
Plain Mucinex (NOT D) for thick secretions ;force NON dairy fluids .   Nasal cleansing in the shower as discussed with lather of mild shampoo.After 10 seconds wash off lather while  exhaling through nostrils. Make sure that all residual soap is removed to prevent irritation.  Flonase OR Nasacort AQ 1 spray in each nostril twice a day as needed. Use the "crossover" technique into opposite nostril spraying toward opposite ear @ 45 degree angle, not straight up into nostril.  Plain Allegra (NOT D )  160 daily , Loratidine 10 mg , OR Zyrtec 10 mg @ bedtime  as needed for itchy eyes & sneezing.  To prevent palpitations or premature beats, avoid stimulants such as decongestants, diet pills, nicotine, or caffeine (coffee, tea, cola, or chocolate) to excess.

## 2015-04-03 NOTE — Progress Notes (Signed)
Pre visit review using our clinic review tool, if applicable. No additional management support is needed unless otherwise documented below in the visit note. 

## 2015-04-03 NOTE — Progress Notes (Signed)
   Subjective:    [name], female    DOB: 1946-09-19, 69 y.o.   MRN: [id number]  HPI  Her symptoms began Sunday 04/01/15 as sneezing. She awoke 6/13 with head congestion, sweats, and sputum production. The sputum was described as thick and green. She also had some scant green nasal secretions.  She's been using her inhaler as well as her allergy medicines.  She states her blood pressure normally runs in the 962X systolic.  She has no other upper respiratory tract symptoms or cardiovascular symptoms.  Review of Systems Frontal headache, facial pain ,  dental pain, sore throat , otic pain or otic discharge denied. No fever or chills.  Chest pain, palpitations, tachycardia, exertional dyspnea, paroxysmal nocturnal dyspnea, claudication or edema are absent.        Objective:   Physical Exam   General appearance:Adequately nourished; no acute distress or increased work of breathing is present.    Lymphatic: No  lymphadenopathy about the head, neck, or axilla .  Eyes: No conjunctival inflammation or lid edema is present. There is no scleral icterus.  Ears:  Hearing aids bilaterally. External ear exam shows no significant lesions or deformities.  Otoscopic examination reveals clear canals, tympanic membranes are intact bilaterally without bulging, retraction, inflammation or discharge.  Nose:  External nasal examination shows no deformity or inflammation. Nasal mucosa are mildly erythematous without lesions or exudates No septal dislocation or deviation.No obstruction to airflow.   Oral exam: Dental hygiene is good; lips and gums are healthy appearing.There is no oropharyngeal erythema or exudate. She is hoarse. Neck:  No deformities, thyromegaly, masses, or tenderness noted.   Supple with full range of motion without pain.   Heart:  Irregular rhythm; bigeminal rhythm suggested. S1 and S2 normal without gallop,  click, or rub . Grade 1 systolic murmur  present  Lungs: Low-grade musical rhonchi in a homogenous pattern. No increased work of breathing.  Extremities:  No cyanosis, edema, or clubbing  noted    Skin: Warm & dry w/o tenting . No significant lesions or rash.       Assessment & Plan:  #1 acute bronchitis  #2 irregular heart rhythm; rule out bigeminy  EKG does not reveal any extra beats. There is sinus bradycardia with voltage criteria for left ventricular hypertrophy. She also has diffuse nonspecific T changes without definite ischemia.  After the EKG was reviewed; I reexamined her. The rhythm was regular with a grade 1 systolic murmur. She had no premature beats. I did discuss avoiding stimulants with her.  #3 essential hypertension. A nurse visit to recheck blood pressure will be requested in the next 48-72 hours.  #4 history of asthma with active bronchospasm  Plan: See orders

## 2015-04-05 ENCOUNTER — Ambulatory Visit (INDEPENDENT_AMBULATORY_CARE_PROVIDER_SITE_OTHER): Payer: Medicare Other | Admitting: *Deleted

## 2015-04-05 VITALS — BP 158/76

## 2015-04-05 DIAGNOSIS — I10 Essential (primary) hypertension: Secondary | ICD-10-CM

## 2015-04-05 NOTE — Progress Notes (Signed)
   Subjective:    [name], female    DOB: [birth year], 69 y.o.   MRN: [id number]  HPI    Review of Systems     Objective:   Physical Exam        Assessment & Plan:

## 2015-04-09 ENCOUNTER — Telehealth: Payer: Self-pay | Admitting: *Deleted

## 2015-04-09 NOTE — Telephone Encounter (Signed)
-----   Message from Hendricks Limes, MD sent at 04/07/2015 10:32 AM EDT ----- Your ideal BP goal = AVERAGE < 135/85.Minimal goal is average < 140/90. Avoid ingestion of  excess salt/sodium.Cook with pepper & other spices . Use the salt substitute  Mrs Deliah Boston products to season food @ the table. Avoid foods which taste salty or "vinegary" as their sodium content will be high.Follow up OV after you are well in 2-3 weeks.

## 2015-04-09 NOTE — Telephone Encounter (Signed)
Called pt gave her md response. Made appt for 04/25/15 for f/u...Pamela Morgan

## 2015-04-17 ENCOUNTER — Telehealth: Payer: Self-pay

## 2015-04-17 NOTE — Telephone Encounter (Signed)
Pt came in with dtr. Wanted to know if the fax for surgical clearance was sent to [facility].  Confirmed if is was received by calling Abigail Butts. Abigail Butts stated that it was not. She resent. It was received. Dr. Asa Lente signed and it has been faxed. Called and left a message with Abigail Butts to let me know if she did not receive the fax.

## 2015-04-18 ENCOUNTER — Other Ambulatory Visit: Payer: Self-pay | Admitting: Surgical

## 2015-04-25 ENCOUNTER — Encounter: Payer: Self-pay | Admitting: Internal Medicine

## 2015-04-25 ENCOUNTER — Ambulatory Visit (INDEPENDENT_AMBULATORY_CARE_PROVIDER_SITE_OTHER): Payer: Medicare Other | Admitting: Internal Medicine

## 2015-04-25 VITALS — BP 150/76 | HR 56 | Temp 98.1°F | Resp 18 | Wt 185.0 lb

## 2015-04-25 DIAGNOSIS — I10 Essential (primary) hypertension: Secondary | ICD-10-CM | POA: Diagnosis not present

## 2015-04-25 LAB — HM MAMMOGRAPHY

## 2015-04-25 MED ORDER — CARVEDILOL 3.125 MG PO TABS: 3.1250 mg | ORAL_TABLET | Freq: Two times a day (BID) | ORAL | Status: DC

## 2015-04-25 NOTE — Progress Notes (Signed)
Pre visit review using our clinic review tool, if applicable. No additional management support is needed unless otherwise documented below in the visit note. 

## 2015-04-25 NOTE — Patient Instructions (Signed)
Minimal Blood Pressure Goal= AVERAGE < 140/90;  Ideal is an AVERAGE < 135/85. This AVERAGE should be calculated from @ least 5 BP readings taken @ different times of day on different days of week. You should not respond to isolated BP readings , but rather the AVERAGE for that week .Please bring your  blood pressure cuff to office visits to verify that it is reliable.It  can also be checked against the blood pressure device at the pharmacy. Finger or wrist cuffs are not dependable; an arm cuff is.  The carvedilol has been proven to be very valuable in cardiac her heart protection with diabetes.
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
## 2015-04-26 ENCOUNTER — Encounter: Payer: Self-pay | Admitting: Internal Medicine

## 2015-05-03 ENCOUNTER — Ambulatory Visit (HOSPITAL_COMMUNITY)
Admission: RE | Admit: 2015-05-03 | Discharge: 2015-05-03 | Disposition: A | Discharge status: Home / Self Care | Payer: Medicare Other | Source: Ambulatory Visit | Attending: Surgical | Admitting: Surgical

## 2015-05-03 ENCOUNTER — Telehealth: Payer: Self-pay | Admitting: Internal Medicine

## 2015-05-03 ENCOUNTER — Encounter (HOSPITAL_COMMUNITY): Payer: Self-pay

## 2015-05-03 ENCOUNTER — Encounter (HOSPITAL_COMMUNITY)
Admission: RE | Admit: 2015-05-03 | Discharge: 2015-05-03 | Disposition: A | Discharge status: Home / Self Care | Payer: Medicare Other | Source: Ambulatory Visit | Attending: Orthopedic Surgery | Admitting: Orthopedic Surgery

## 2015-05-03 ENCOUNTER — Inpatient Hospital Stay (HOSPITAL_COMMUNITY): Admission: RE | Admit: 2015-05-03 | Payer: Medicare Other | Source: Ambulatory Visit

## 2015-05-03 DIAGNOSIS — M5136 Other intervertebral disc degeneration, lumbar region: Secondary | ICD-10-CM | POA: Diagnosis not present

## 2015-05-03 DIAGNOSIS — I10 Essential (primary) hypertension: Secondary | ICD-10-CM | POA: Diagnosis not present

## 2015-05-03 DIAGNOSIS — M5127 Other intervertebral disc displacement, lumbosacral region: Secondary | ICD-10-CM | POA: Diagnosis not present

## 2015-05-03 DIAGNOSIS — Z01818 Encounter for other preprocedural examination: Secondary | ICD-10-CM | POA: Diagnosis not present

## 2015-05-03 DIAGNOSIS — M47816 Spondylosis without myelopathy or radiculopathy, lumbar region: Secondary | ICD-10-CM | POA: Diagnosis not present

## 2015-05-03 HISTORY — DX: Cardiac arrhythmia, unspecified: I49.9

## 2015-05-03 LAB — PROTIME-INR
INR: 1 (ref 0.00–1.49)
Prothrombin Time: 13.4 seconds (ref 11.6–15.2)

## 2015-05-03 LAB — CBC WITH DIFFERENTIAL/PLATELET
Basophils Absolute: 0.1 10*3/uL (ref 0.0–0.1)
Basophils Relative: 1 % (ref 0–1)
Eosinophils Absolute: 0.5 10*3/uL (ref 0.0–0.7)
Eosinophils Relative: 7 % — ABNORMAL HIGH (ref 0–5)
HCT: 39.8 % (ref 36.0–46.0)
Hemoglobin: 12.8 g/dL (ref 12.0–15.0)
Lymphocytes Relative: 27 % (ref 12–46)
Lymphs Abs: 2 10*3/uL (ref 0.7–4.0)
MCH: 27.6 pg (ref 26.0–34.0)
MCHC: 32.2 g/dL (ref 30.0–36.0)
MCV: 85.8 fL (ref 78.0–100.0)
Monocytes Absolute: 0.4 10*3/uL (ref 0.1–1.0)
Monocytes Relative: 5 % (ref 3–12)
Neutro Abs: 4.6 10*3/uL (ref 1.7–7.7)
Neutrophils Relative %: 60 % (ref 43–77)
Platelets: 248 10*3/uL (ref 150–400)
RBC: 4.64 MIL/uL (ref 3.87–5.11)
RDW: 13.1 % (ref 11.5–15.5)
WBC: 7.6 10*3/uL (ref 4.0–10.5)

## 2015-05-03 LAB — COMPREHENSIVE METABOLIC PANEL
ALT: 24 U/L (ref 14–54)
AST: 24 U/L (ref 15–41)
Albumin: 4.3 g/dL (ref 3.5–5.0)
Alkaline Phosphatase: 83 U/L (ref 38–126)
Anion gap: 9 (ref 5–15)
BUN: 12 mg/dL (ref 6–20)
CO2: 30 mmol/L (ref 22–32)
Calcium: 9.1 mg/dL (ref 8.9–10.3)
Chloride: 100 mmol/L — ABNORMAL LOW (ref 101–111)
Creatinine, Ser: 0.88 mg/dL (ref 0.44–1.00)
GFR calc Af Amer: >60 mL/min (ref >60)
GFR calc non Af Amer: >60 mL/min (ref >60)
Glucose, Bld: 358 mg/dL — ABNORMAL HIGH (ref 65–99)
Potassium: 3.5 mmol/L (ref 3.5–5.1)
Sodium: 139 mmol/L (ref 135–145)
Total Bilirubin: 0.9 mg/dL (ref 0.3–1.2)
Total Protein: 7.3 g/dL (ref 6.5–8.1)

## 2015-05-03 LAB — SURGICAL PCR SCREEN
MRSA, PCR: NEGATIVE
Staphylococcus aureus: NEGATIVE

## 2015-05-03 LAB — APTT: aPTT: 31 seconds (ref 24–37)

## 2015-05-03 NOTE — Patient Instructions (Addendum)
MESHELL ABDULAZIZ  05/03/2015   Your procedure is scheduled on: Thursday May 10, 2015  Report to [facility]  Entrance take [place]  elevators to 3rd floor to  [place] at 10:30 AM.  Call this number if you have problems the morning of surgery [phone]   Remember: ONLY 1 PERSON MAY GO WITH YOU TO SHORT STAY TO GET  READY MORNING OF [place].  Do not eat food After Midnight but may take clear liquids till 6:30 am day of surgery then nothing by mouth.     Take these medicines the morning of surgery with A SIP OF WATER: Amlodipine (Norvasc);Albuterol Inhaler if needed; Symbicort Inhaler; Carvedilol (Coreg); fexofenadine (Allegra) if needed; Duoneb neb treatment if needed; Patanol eye drops; Pantoprazole (Protonix); Famotidine (Pepcid)                 BRING INHALERS AND EYE DROPS WITH YOU DAY OF SURGERY.                               You may not have any metal on your body including hair pins and              piercings  Do not wear jewelry, make-up, lotions, powders or perfumes, deodorant             Do not wear nail polish.  Do not shave  48 hours prior to surgery.                Do not bring valuables to the hospital. [place].  Contacts, dentures or bridgework may not be worn into surgery.  Leave suitcase in the car. After surgery it may be brought to your room.      Please read over the following fact sheets you were given:MRSA INFORMATION SHEET; INCENTIVE SPIROMETER  _____________________________________________________________________             [facility] - Preparing for Surgery Before surgery, you can play an important role.  Because skin is not sterile, your skin needs to be as free of germs as possible.  You can reduce the number of germs on your skin by washing with CHG (chlorahexidine gluconate) soap before surgery.  CHG is an antiseptic cleaner which kills germs and bonds with the skin  to continue killing germs even after washing. Please DO NOT use if you have an allergy to CHG or antibacterial soaps.  If your skin becomes reddened/irritated stop using the CHG and inform your nurse when you arrive at Short Stay. Do not shave (including legs and underarms) for at least 48 hours prior to the first CHG shower.  You may shave your face/neck. Please follow these instructions carefully:  1.  Shower with CHG Soap the night before surgery and the  morning of Surgery.  2.  If you choose to wash your hair, wash your hair first as usual with your  normal  shampoo.  3.  After you shampoo, rinse your hair and body thoroughly to remove the  shampoo.                           4.  Use CHG as you would any other liquid  soap.  You can apply chg directly  to the skin and wash                       Gently with a scrungie or clean washcloth.  5.  Apply the CHG Soap to your body ONLY FROM THE NECK DOWN.   Do not use on face/ open                           Wound or open sores. Avoid contact with eyes, ears mouth and genitals (private parts).                       Wash face,  Genitals (private parts) with your normal soap.             6.  Wash thoroughly, paying special attention to the area where your surgery  will be performed.  7.  Thoroughly rinse your body with warm water from the neck down.  8.  DO NOT shower/wash with your normal soap after using and rinsing off  the CHG Soap.                9.  Pat yourself dry with a clean towel.            10.  Wear clean pajamas.            11.  Place clean sheets on your bed the night of your first shower and do not  sleep with pets. Day of Surgery : Do not apply any lotions/deodorants the morning of surgery.  Please wear clean clothes to the hospital/surgery center.  FAILURE TO FOLLOW THESE INSTRUCTIONS MAY RESULT IN THE CANCELLATION OF YOUR SURGERY PATIENT SIGNATURE_________________________________  NURSE  SIGNATURE__________________________________  ________________________________________________________________________   [name]  An incentive spirometer is a tool that can help keep your lungs clear and active. This tool measures how well you are filling your lungs with each breath. Taking long deep breaths may help reverse or decrease the chance of developing breathing (pulmonary) problems (especially infection) following:  A long period of time when you are unable to move or be active. BEFORE THE PROCEDURE   If the spirometer includes an indicator to show your best effort, your nurse or respiratory therapist will set it to a desired goal.  If possible, sit up straight or lean slightly forward. Try not to slouch.  Hold the incentive spirometer in an upright position. INSTRUCTIONS FOR USE  1. Sit on the edge of your bed if possible, or sit up as far as you can in bed or on a chair. 2. Hold the incentive spirometer in an upright position. 3. Breathe out normally. 4. Place the mouthpiece in your mouth and seal your lips tightly around it. 5. Breathe in slowly and as deeply as possible, raising the piston or the ball toward the top of the column. 6. Hold your breath for 3-5 seconds or for as long as possible. Allow the piston or ball to fall to the bottom of the column. 7. Remove the mouthpiece from your mouth and breathe out normally. 8. Rest for a few seconds and repeat Steps 1 through 7 at least 10 times every 1-2 hours when you are awake. Take your time and take a few normal breaths between deep breaths. 9. The spirometer may include an indicator to show your best effort. Use the indicator as a goal to  work toward during each repetition. 10. After each set of 10 deep breaths, practice coughing to be sure your lungs are clear. If you have an incision (the cut made at the time of surgery), support your incision when coughing by placing a pillow or rolled up towels firmly  against it. Once you are able to get out of bed, walk around indoors and cough well. You may stop using the incentive spirometer when instructed by your caregiver.  RISKS AND COMPLICATIONS  Take your time so you do not get dizzy or light-headed.  If you are in pain, you may need to take or ask for pain medication before doing incentive spirometry. It is harder to take a deep breath if you are having pain. AFTER USE  Rest and breathe slowly and easily.  It can be helpful to keep track of a log of your progress. Your caregiver can provide you with a simple table to help with this. If you are using the spirometer at home, follow these instructions: [place] IF:   You are having difficultly using the spirometer.  You have trouble using the spirometer as often as instructed.  Your pain medication is not giving enough relief while using the spirometer.  You develop fever of 100.5 F (38.1 C) or higher. SEEK IMMEDIATE MEDICAL CARE IF:   You cough up bloody sputum that had not been present before.  You develop fever of 102 F (38.9 C) or greater.  You develop worsening pain at or near the incision site. MAKE SURE YOU:   Understand these instructions.  Will watch your condition.  Will get help right away if you are not doing well or get worse. Document Released: 02/16/2007 Document Revised: 12/29/2011 Document Reviewed: 04/19/2007 ExitCare Patient Information 2014 ExitCare, [place].   ________________________________________________________________________    CLEAR LIQUID DIET   Foods Allowed                                                                     Foods Excluded  Coffee and tea, regular and decaf                             liquids that you cannot  Plain Jell-O in any flavor                                             see through such as: Fruit ices (not with fruit pulp)                                     milk, soups, orange juice  Iced Popsicles                                     All solid food Carbonated beverages, regular and diet  Cranberry, grape and apple juices Sports drinks like Gatorade Lightly seasoned clear broth or consume(fat free) Sugar, honey syrup  Sample Menu Breakfast                                Lunch                                     Supper Cranberry juice                    Beef broth                            Chicken broth Jell-O                                     Grape juice                           Apple juice Coffee or tea                        Jell-O                                      Popsicle                                                Coffee or tea                        Coffee or tea  _____________________________________________________________________

## 2015-05-03 NOTE — Progress Notes (Signed)
EKG per epic 04/03/2015  Clearance note per chart per Dr Asa Lente 01/11/2015

## 2015-05-03 NOTE — Progress Notes (Addendum)
Contacted anesthesia spoke with Dr Marcell Barlow in regards to pts glucose results per CMP (in epic) with PAT visit 05/03/2015. Results have been sent to Dr Gladstone Lighter and Dr Asa Lente. Instructed per anesthesia for pt to see PCP prior to surgery date. This nurse contacted Dr Katheren Puller office spoke with [facility]. Appt scheduled for pt for Monday 05/07/2015 at 10 am. This nurse spoke with [name] in regards to situation. Daughter aware of appt time an instructed if any problems to notify Dr Katheren Puller office. Pts daughter verbalized understanding.

## 2015-05-07 ENCOUNTER — Encounter: Payer: Self-pay | Admitting: Internal Medicine

## 2015-05-07 ENCOUNTER — Other Ambulatory Visit: Payer: Self-pay | Admitting: Internal Medicine

## 2015-05-07 ENCOUNTER — Other Ambulatory Visit (INDEPENDENT_AMBULATORY_CARE_PROVIDER_SITE_OTHER): Payer: Medicare Other

## 2015-05-07 ENCOUNTER — Ambulatory Visit (INDEPENDENT_AMBULATORY_CARE_PROVIDER_SITE_OTHER): Payer: Medicare Other | Admitting: Internal Medicine

## 2015-05-07 VITALS — BP 138/68 | HR 52 | Temp 97.9°F | Resp 16 | Wt 181.0 lb

## 2015-05-07 DIAGNOSIS — I10 Essential (primary) hypertension: Secondary | ICD-10-CM | POA: Diagnosis not present

## 2015-05-07 DIAGNOSIS — IMO0002 Reserved for concepts with insufficient information to code with codable children: Secondary | ICD-10-CM

## 2015-05-07 DIAGNOSIS — M5126 Other intervertebral disc displacement, lumbar region: Secondary | ICD-10-CM

## 2015-05-07 DIAGNOSIS — E1165 Type 2 diabetes mellitus with hyperglycemia: Secondary | ICD-10-CM | POA: Diagnosis not present

## 2015-05-07 LAB — HEMOGLOBIN A1C: HEMOGLOBIN A1C: 10.7 % — AB (ref 4.6–6.5)

## 2015-05-07 LAB — MICROALBUMIN / CREATININE URINE RATIO
Creatinine,U: 62.4 mg/dL
MICROALB/CREAT RATIO: 1.1 mg/g (ref 0.0–30.0)
Microalb, Ur: <0.7 mg/dL (ref 0.0–1.9)
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
## 2015-05-07 NOTE — Progress Notes (Signed)
Pre visit review using our clinic review tool, if applicable. No additional management support is needed unless otherwise documented below in the visit note. 

## 2015-05-07 NOTE — Patient Instructions (Signed)
  Your next office appointment will be determined based upon review of your pending labs. Those written interpretation of the lab results and instructions will be transmitted to you by mail for your records.  Critical results will be called.   Followup as needed for any active or acute issue. Please report any significant change in your symptoms. 

## 2015-05-08 ENCOUNTER — Other Ambulatory Visit: Payer: Self-pay | Admitting: Emergency Medicine

## 2015-05-09 ENCOUNTER — Telehealth: Payer: Self-pay | Admitting: Emergency Medicine

## 2015-05-09 NOTE — Telephone Encounter (Signed)
Spoke with pts daughter and informed her that pt was not medically clear to have surgery. She stated they had received a call from endo but were not sure what route to take but would call them back and confirm appt. As of now pt is not cleared medically for surgery tomorrow due to uncontrolled diabetes.

## 2015-05-09 NOTE — Telephone Encounter (Signed)
[facility] called wanting clearance for surgery tomorrow. Are you okay with her recent labs being at the levels they are with surgery. Nash Dimmer from [facility] was okay with me documenting in the chart and routing it to her. They are able to see pts chart through epic.

## 2015-05-09 NOTE — Telephone Encounter (Signed)
The preop office visit and labs were faxed to their office. She has severely uncontrolled diabetes and is not cleared medically. She's been referred to the endocrinologist for adequate diabetes control.

## 2015-05-09 NOTE — Progress Notes (Signed)
Notified Abigail Butts with Dr Charlestine Night office in regards to Dr Clayborn Heron note in regards to no medical clearance at this point. Pt is due to see endocrinologist for diabetic control.

## 2015-05-10 ENCOUNTER — Encounter: Payer: Self-pay | Admitting: Internal Medicine

## 2015-05-10 ENCOUNTER — Encounter (HOSPITAL_COMMUNITY): Admission: RE | Payer: Self-pay | Source: Ambulatory Visit

## 2015-05-10 ENCOUNTER — Ambulatory Visit (HOSPITAL_COMMUNITY): Admission: RE | Admit: 2015-05-10 | Payer: Medicare Other | Source: Ambulatory Visit | Admitting: Orthopedic Surgery

## 2015-05-10 ENCOUNTER — Ambulatory Visit (INDEPENDENT_AMBULATORY_CARE_PROVIDER_SITE_OTHER): Payer: Medicare Other | Admitting: Internal Medicine

## 2015-05-10 VITALS — BP 126/80 | HR 52 | Temp 98.6°F | Resp 12 | Ht 63.0 in | Wt 180.6 lb

## 2015-05-10 DIAGNOSIS — E11319 Type 2 diabetes mellitus with unspecified diabetic retinopathy without macular edema: Secondary | ICD-10-CM | POA: Diagnosis not present

## 2015-05-10 DIAGNOSIS — E1165 Type 2 diabetes mellitus with hyperglycemia: Secondary | ICD-10-CM | POA: Diagnosis not present

## 2015-05-10 DIAGNOSIS — IMO0002 Reserved for concepts with insufficient information to code with codable children: Secondary | ICD-10-CM

## 2015-05-10 SURGERY — LUMBAR LAMINECTOMY/DECOMPRESSION MICRODISCECTOMY 1 LEVEL
Anesthesia: General | Laterality: Left

## 2015-05-10 MED ORDER — INSULIN DETEMIR 100 UNIT/ML FLEXPEN: 20.0000 [IU] | PEN_INJECTOR | Freq: Every day | SUBCUTANEOUS | Status: DC

## 2015-05-10 MED ORDER — GLIPIZIDE ER 5 MG PO TB24: 5.0000 mg | ORAL_TABLET | Freq: Every day | ORAL | Status: DC

## 2015-05-10 NOTE — Patient Instructions (Addendum)
Please continue: - Levemir 20 units at bedtime  - Januvia 100 mg daily in am  Start Glipizide XL 5 mg daily in am.  Please return in 1.5 month with your sugar log.   PATIENT INSTRUCTIONS FOR TYPE 2 DIABETES:  **Please join MyChart!** - see attached instructions about how to join if you have not done so already.  DIET AND EXERCISE Diet and exercise is an important part of diabetic treatment.  We recommended aerobic exercise in the form of brisk walking (working between 40-60% of maximal aerobic capacity, similar to brisk walking) for 150 minutes per week (such as 30 minutes five days per week) along with 3 times per week performing 'resistance' training (using various gauge rubber tubes with handles) 5-10 exercises involving the major muscle groups (upper body, lower body and core) performing 10-15 repetitions (or near fatigue) each exercise. Start at half the above goal but build slowly to reach the above goals. If limited by weight, joint pain, or disability, we recommend daily walking in a swimming pool with water up to waist to reduce pressure from joints while allow for adequate exercise.    BLOOD GLUCOSES Monitoring your blood glucoses is important for continued management of your diabetes. Please check your blood glucoses 2-4 times a day: fasting, before meals and at bedtime (you can rotate these measurements - e.g. one day check before the 3 meals, the next day check before 2 of the meals and before bedtime, etc.).   HYPOGLYCEMIA (low blood sugar) Hypoglycemia is usually a reaction to not eating, exercising, or taking too much insulin/ other diabetes drugs.  Symptoms include tremors, sweating, hunger, confusion, headache, etc. Treat IMMEDIATELY with 15 grams of Carbs: . 4 glucose tablets .  cup regular juice/soda . 2 tablespoons raisins . 4 teaspoons sugar . 1 tablespoon honey Recheck blood glucose in 15 mins and repeat above if still symptomatic/blood glucose  <100.  RECOMMENDATIONS TO REDUCE YOUR RISK OF DIABETIC COMPLICATIONS: * Take your prescribed MEDICATION(S) * Follow a DIABETIC diet: Complex carbs, fiber rich foods, (monounsaturated and polyunsaturated) fats * AVOID saturated/trans fats, high fat foods, >2,300 mg salt per day. * EXERCISE at least 5 times a week for 30 minutes or preferably daily.  * DO NOT SMOKE OR DRINK more than 1 drink a day. * Check your FEET every day. Do not wear tightfitting shoes. Contact us if you develop an ulcer * See your EYE doctor once a year or more if needed * Get a FLU shot once a year * Get a PNEUMONIA vaccine once before and once after age 68 years  GOALS:  * Your Hemoglobin A1c of <7%  * fasting sugars need to be <130 * after meals sugars need to be <180 (2h after you start eating) * Your Systolic BP should be 580 or lower  * Your Diastolic BP should be 80 or lower  * Your HDL (Good Cholesterol) should be 40 or higher  * Your LDL (Bad Cholesterol) should be 100 or lower. * Your Triglycerides should be 150 or lower  * Your Urine microalbumin (kidney function) should be <30 * Your Body Mass Index should be 25 or lower   Please consider the following ways to cut down carbs and fat and increase fiber and micronutrients in your diet: - substitute whole grain for white bread or pasta - substitute brown rice for white rice - substitute 90-calorie flat bread pieces for slices of bread when possible - substitute sweet potatoes or yams for  white potatoes - substitute humus for margarine - substitute tofu for cheese when possible - substitute almond or rice milk for regular milk (would not drink soy milk daily due to concern for soy estrogen influence on breast cancer risk) - substitute dark chocolate for other sweets when possible - substitute water - can add lemon or orange slices for taste - for diet sodas (artificial sweeteners will trick your body that you can eat sweets without getting calories and  will lead you to overeating and weight gain in the long run) - do not skip breakfast or other meals (this will slow down the metabolism and will result in more weight gain over time)  - can try smoothies made from fruit and almond/rice milk in am instead of regular breakfast - can also try old-fashioned (not instant) oatmeal made with almond/rice milk in am - order the dressing on the side when eating salad at a restaurant (pour less than half of the dressing on the salad) - eat as little meat as possible - can try juicing, but should not forget that juicing will get rid of the fiber, so would alternate with eating raw veg./fruits or drinking smoothies - use as little oil as possible, even when using olive oil - can dress a salad with a mix of balsamic vinegar and lemon juice, for e.g. - use agave nectar, stevia sugar, or regular sugar rather than artificial sweateners - steam or broil/roast veggies  - snack on veggies/fruit/nuts (unsalted, preferably) when possible, rather than processed foods - reduce or eliminate aspartame in diet (it is in diet sodas, chewing gum, etc) Read the labels!  Try to read Dr. Janene Harvey book: "Program for Reversing Diabetes" for other ideas for healthy eating.
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
## 2015-05-14 ENCOUNTER — Ambulatory Visit (INDEPENDENT_AMBULATORY_CARE_PROVIDER_SITE_OTHER): Payer: Medicare Other | Admitting: Internal Medicine

## 2015-05-14 ENCOUNTER — Encounter: Payer: Self-pay | Admitting: Internal Medicine

## 2015-05-14 VITALS — BP 136/74 | HR 57 | Ht 63.0 in | Wt 182.4 lb

## 2015-05-14 DIAGNOSIS — E669 Obesity, unspecified: Secondary | ICD-10-CM | POA: Diagnosis not present

## 2015-05-14 DIAGNOSIS — J4530 Mild persistent asthma, uncomplicated: Secondary | ICD-10-CM

## 2015-05-14 DIAGNOSIS — I10 Essential (primary) hypertension: Secondary | ICD-10-CM

## 2015-05-14 MED ORDER — METHYLPREDNISOLONE ACETATE 80 MG/ML IJ SUSP
120.0000 mg | Freq: Once | INTRAMUSCULAR | Status: AC
  Administered 2015-05-14: 120 mg via INTRAMUSCULAR

## 2015-05-14 NOTE — Progress Notes (Signed)
Subjective:    [name], female    DOB: 1946/05/14,    MRN: [id number]    Brief patient profile:  51 yobm never smoker with dx of asthma in her 30s previously on shots some better but never completely  with chronic cough x decades worse x 2015 so referred by Pamela Morgan to pulmonary clinic 01/05/2015    01/05/2015 1st Pamela Morgan/ Pamela Morgan / maint rx with symbiocrt 160   Chief Complaint  Patient presents with  . Pulmonary Consult    Referred by Pamela. Pamela Morgan. [name]. Cough is prod with minimal white sputum.  She states SOB "when I do alot of work" or when she lies down.   Cough is perennial,  worse day=  night more dry than wet and present x decades, only gets convincing response while on prednisone/ poor hfa, always immediately worse when lie down and better sitting up, better p saba. , no better on advair dpi  Seen by Pamela Morgan 12/25/14 overusing saba and rec trial of symbicort 160 2bid "helps some" with noct cough but not daytime coughing. > Symbicort 80 Take 2 puffs first thing in am and then another 2 puffs about 12 hours later.  Work on inhaler technique:  relax and gently blow all the way out   Only use your albuterol as a rescue medication  Stop hyzar and use valsartan 160 /25 one daily  Pantoprazole (protonix) 40 mg   Take 30-60 min before first meal of the day and Pepcid 20 mg one      02/06/2015 NP Follow up : Asthma/Chronic Cough  Patient returns for 1 month follow-up and medication review. We reviewed all her medications organized them into a medication calendar  with patient education. Appears to be taking her medications correctly. Although she did not bring several of her meds as she is waiting on rx from [place] system.  Patient was seen last month for initial pulmonary consultation for difficult to control asthma and cough She was changed off of Hyzaar to Diovan HCTZ. And started on Symbicort inhaler. She  says she is feeling improved with decreased cough and shortness of breath.. She denies any hemoptysis, orthopnea, PND, leg swelling. B/p is up today says she is out of her b/p meds, waiting on shipment from [place] system.  She did not go get refill of Diovan , we discussed going to pharmacy for this.  She denies any chest pain, orthopnea, PND or leg swelling. 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 Follow-up    PFT done today. Pt states her breathing is doing well. She has only used albuterol x 1 since the last Morgan.    rec No change rx   05/14/2015 f/u ov/Pamela Morgan re:  No med calendar "they keep changing my meds" / symbicort is empty/ does not have rescue not really using  Chief Complaint  Patient presents with  . Follow-up    Pt states her breathing is overall doing well. She c/o PND and chest congestion for the past 2 days- she has been using rescue inhaler for these symptoms.       Had been doing well prior to exp to perfume in church that made her start coughing  day > noct with sense of chest congestion but not excess or purulent sputum  No obvious day to day or daytime variabilty or assoc  cp or chest tightness, subjective wheeze or overt hb symptoms. No unusual exp hx or h/o childhood pna/ asthma or knowledge of premature birth.  Sleeping ok without nocturnal  or early am exacerbation  of respiratory  c/o's or need for noct saba. Also denies any obvious fluctuation of symptoms with weather or environmental changes or other aggravating or alleviating factors except as outlined above   Current Medications, Allergies, Complete Past Medical History, Past Surgical History, Family History, and Social History were reviewed in [employer] record.  ROS  The following are not active complaints unless  bolded sore throat, dysphagia, dental problems, itching, sneezing,  nasal congestion or excess/ purulent secretions, ear ache,   fever, chills, sweats, unintended wt loss, pleuritic or exertional cp, hemoptysis,  orthopnea pnd or leg swelling, presyncope, palpitations, heartburn, abdominal pain, anorexia, nausea, vomiting, diarrhea  or change in bowel or urinary habits, change in stools or urine, dysuria,hematuria,  rash, arthralgias, visual complaints, headache, numbness weakness or ataxia or problems with walking or coordination,  change in mood/affect or memory.       Objective:   Physical Exam  amb bf nad   05/14/2015         182  Wt Readings from Last 3 Encounters:  03/20/15 186 lb (84.369 kg)  02/06/15 184 lb 6.4 oz (83.643 kg)  01/11/15 177 lb 4 oz (80.4 kg)    Vital signs reviewed   HEENT: nl dentition, turbinates, and orophanx. Nl external ear canals without cough reflex   NECK :  without JVD/Nodes/TM/ nl carotid upstrokes bilaterally   LUNGS: no acc muscle use,  minimal insp squeaks/ exp wheeze difficult to distinguish from upper airway noise    CV:  RRR  no s3 or murmur or increase in P2, no edema   ABD:  soft and nontender with nl excursion in the supine position. No bruits or organomegaly, bowel sounds nl  MS:  warm without deformities, calf tenderness, cyanosis or clubbing  SKIN: warm and dry without lesions    NEURO:  alert, approp, no deficits      I personally reviewed images and agree with radiology impression as follows:  CXR:  05/03/15 No active cardiopulmonary disease.          Assessment & Plan:

## 2015-05-14 NOTE — Patient Instructions (Addendum)
See Tammy NP next available (not a new med calendar, just a check to be sure you have everything on the calendar and change your coreg to alternative   Depo 120 mg IM today in office   Once you have seen Tammy and we are sure that we're all on the same page with your medication use she will arrange follow up with me.

## 2015-05-20 ENCOUNTER — Encounter: Payer: Self-pay | Admitting: Internal Medicine

## 2015-05-20 DIAGNOSIS — E669 Obesity, unspecified: Secondary | ICD-10-CM | POA: Insufficient documentation

## 2015-05-20 NOTE — Assessment & Plan Note (Signed)
Changed losartan to valsartan 01/05/15 due to concern with cough   Cough was better now ? Asthmatic component so Strongly prefer in this setting: Bystolic, the most beta -1  selective Beta blocker available in sample form, with bisoprolol the most selective generic choice  on the market. > defer to primary care

## 2015-05-20 NOTE — Assessment & Plan Note (Signed)
4/29/ 2009 [facility] eval, nl pfts  02/06/2015 med calendar  - PFTs wnl p am symbicort x low erv - 7/25 /2016 p extensive coaching HFA effectiveness =    75% > restarted on symbicort 80 as inhaler was empty  Not clear how much is asthma vs pseudoasthma/ uacs but whatever it is has proved difficult to control.  DDX of  difficult airways management all start with A and  include Adherence, Ace Inhibitors, Acid Reflux, Active Sinus Disease, Alpha 1 Antitripsin deficiency, Anxiety masquerading as Airways dz,  ABPA,  allergy(esp in young), Aspiration (esp in elderly), Adverse effects of meds,  Active smokers, A bunch of PE's (a small clot burden can't cause this syndrome unless there is already severe underlying pulm or vascular dz with poor reserve) plus two Bs  = Bronchiectasis and Beta blocker use..and one C= CHF  Adherence is always the initial "prime suspect" and is a multilayered concern that requires a "trust but verify" approach in every patient - starting with knowing how to use medications, especially inhalers, correctly, keeping up with refills and understanding the fundamental difference between maintenance and prns vs those medications only taken for a very short course and then stopped and not refilled.  - not updating med calendar/ done - not keeping up with counter on symbicort/ reviewed - The proper method of use, as well as anticipated side effects, of a metered-dose inhaler are discussed and demonstrated to the patient. Improved effectiveness after extensive coaching during this visit to a level of approximately  75%    ? Acid (or non-acid) GERD > always difficult to exclude as up to 75% of pts in some series report no assoc GI/ Heartburn symptoms> rec max (24h)  acid suppression and diet restrictions/ reviewed and instructions given in writing.   ? Allergy > depomedrol 120 mg IM as doesn't do well with steroids po   I had an extended discussion with the patient reviewing all relevant  studies completed to date and  lasting 15 to 20 minutes of a 25 minute visit    Each maintenance medication was reviewed in detail including most importantly the difference between maintenance and prns and under what circumstances the prns are to be triggered using an action plan format that is not reflected in the computer generated alphabetically organized AVS.    Please see instructions for details which were reviewed in writing and the patient given a copy highlighting the part that I personally wrote and discussed at today's ov.

## 2015-05-20 NOTE — Assessment & Plan Note (Signed)
Body mass index is 32.32 kg/(m^2).  Lab Results  Component Value Date   TSH 1.77 15/80/6386     Complicated by aodm/ dm/gerd / hbp/reviewed need  achieve and maintain neg calorie balance > defer f/u primary care including intermittently monitoring thyroid status

## 2015-06-08 ENCOUNTER — Encounter: Payer: Self-pay | Admitting: Adult Health

## 2015-06-08 ENCOUNTER — Ambulatory Visit (INDEPENDENT_AMBULATORY_CARE_PROVIDER_SITE_OTHER): Payer: Medicare Other | Admitting: Adult Health

## 2015-06-08 VITALS — BP 110/60 | HR 51 | Temp 98.1°F | Ht 63.0 in | Wt 183.0 lb

## 2015-06-08 DIAGNOSIS — J4530 Mild persistent asthma, uncomplicated: Secondary | ICD-10-CM

## 2015-06-08 DIAGNOSIS — I10 Essential (primary) hypertension: Secondary | ICD-10-CM | POA: Diagnosis not present

## 2015-06-08 MED ORDER — BISOPROLOL FUMARATE 5 MG PO TABS: 5.0000 mg | ORAL_TABLET | Freq: Every day | ORAL | Status: DC

## 2015-06-08 NOTE — Assessment & Plan Note (Signed)
Change Coreg to Bisoprolol to 5mg  daily  Follow up with Primary regarding ongoing blood pressure management .  follow up Dr. Melvyn Novas  In 2 months and As needed   Please contact office for sooner follow up if symptoms do not improve or worsen or seek emergency care

## 2015-06-08 NOTE — Assessment & Plan Note (Signed)
Recent flare with perfume trigger , improved with steroids Will change coreg as non selective beta blocker may contribute to increased asthma symptoms.   Plan  Change Coreg to Bisoprolol to 5mg  daily  Follow up with Primary regarding ongoing blood pressure management .  Continue on Symbicort 2 puffs Twice daily  , rinse after use.  follow up Dr. Melvyn Novas  In 2 months and As needed   Please contact office for sooner follow up if symptoms do not improve or worsen or seek emergency care

## 2015-06-08 NOTE — Progress Notes (Signed)
Subjective:    [name], female    DOB: September 30, 1946,    MRN: [id number]    Brief patient profile:  32 yobm never smoker with dx of asthma in her 38s previously on shots some better but never completely  with chronic cough x decades worse x 2015 so referred by Dr Linna Darner to pulmonary clinic 01/05/2015    01/05/2015 1st [facility] office visit/ Wert / maint rx with symbiocrt 160   Chief Complaint  Patient presents with  . Pulmonary Consult    Referred by Dr. Linna Darner. [name]. Cough is prod with minimal white sputum.  She states SOB "when I do alot of work" or when she lies down.   Cough is perennial,  worse day=  night more dry than wet and present x decades, only gets convincing response while on prednisone/ poor hfa, always immediately worse when lie down and better sitting up, better p saba. , no better on advair dpi  Seen by Dr Linna Darner 12/25/14 overusing saba and rec trial of symbicort 160 2bid "helps some" with noct cough but not daytime coughing. > Symbicort 80 Take 2 puffs first thing in am and then another 2 puffs about 12 hours later.  Work on inhaler technique:  relax and gently blow all the way out   Only use your albuterol as a rescue medication  Stop hyzar and use valsartan 160 /25 one daily  Pantoprazole (protonix) 40 mg   Take 30-60 min before first meal of the day and Pepcid 20 mg one      02/06/2015 NP Follow up : Asthma/Chronic Cough  Patient returns for 1 month follow-up and medication review. We reviewed all her medications organized them into a medication calendar  with patient education. Appears to be taking her medications correctly. Although she did not bring several of her meds as she is waiting on rx from [place] system.  Patient was seen last month for initial pulmonary consultation for difficult to control asthma and cough She was changed off of Hyzaar to Diovan HCTZ. And started on Symbicort inhaler. She  says she is feeling improved with decreased cough and shortness of breath.. She denies any hemoptysis, orthopnea, PND, leg swelling. B/p is up today says she is out of her b/p meds, waiting on shipment from [place] system.  She did not go get refill of Diovan , we discussed going to pharmacy for this.  She denies any chest pain, orthopnea, PND or leg swelling. rec No change rx but follow med calendar    03/20/2015 f/u ov/Wert re: cough variant asthma with ? Secondary gerd/ on symb 80 2bid though technique poor and no med cal/  Chief Complaint  Patient presents with  . Follow-up    PFT done today. Pt states her breathing is doing well. She has only used albuterol x 1 since the last visit.    rec No change rx   05/14/2015 f/u ov/Wert re:  No med calendar "they keep changing my meds" / symbicort is empty/ does not have rescue not really using  Chief Complaint  Patient presents with  . Follow-up    Pt states her breathing is overall doing well. She c/o PND and chest congestion for the past 2 days- she has been using rescue inhaler for these symptoms.       Had been doing well prior to exp to perfume in church that made her start coughing  day > noct with sense of chest congestion but not excess or purulent sputum >>depo medrol injection   06/08/2015 Follow up : Cough variant asthma  Pt returns for 1 month follow up  Last visit with asthma flare after perfume exposure .  Given steroid injection , she is feeling better.  Pt is on coreg (started for HTN in July by Primary ) , discussed with pt and daughter that it can cause increased asthma symptoms.  She denies fever, discolored mucus , orthopnea , increased edema . Or chest pain .  Did not bring meds to visit as recommended. She says she forgot .  We reviewed her list in detail   Current Medications, Allergies, Complete Past Medical History, Past Surgical History, Family History, and Social History were reviewed in [employer] record.  ROS  The following are not active complaints unless bolded sore throat, dysphagia, dental problems, itching, sneezing,  nasal congestion or excess/ purulent secretions, ear ache,   fever, chills, sweats, unintended wt loss, pleuritic or exertional cp, hemoptysis,  orthopnea pnd or leg swelling, presyncope, palpitations, heartburn, abdominal pain, anorexia, nausea, vomiting, diarrhea  or change in bowel or urinary habits, change in stools or urine, dysuria,hematuria,  rash, arthralgias, visual complaints, headache, numbness weakness or ataxia or problems with walking or coordination,  change in mood/affect or memory.       Objective:   Physical Exam  amb bf nad   05/14/2015         182  >183 06/08/2015    Vital signs reviewed   HEENT: nl dentition, turbinates, and orophanx. Nl external ear canals without cough reflex   NECK :  without JVD/Nodes/TM/ nl carotid upstrokes bilaterally   LUNGS: no acc muscle use,  No wheezing noted , decreased BS in bases    CV:  RRR  no s3 or murmur or increase in P2, no edema   ABD:  soft and nontender with nl excursion in the supine position. No bruits or organomegaly, bowel sounds nl  MS:  warm without deformities, calf tenderness, cyanosis or clubbing  SKIN: warm and dry without lesions    NEURO:  alert, approp, no deficits        CXR:  05/03/15 No active cardiopulmonary disease.          Assessment & Plan:

## 2015-06-08 NOTE — Progress Notes (Signed)
Chart and office note reviewed in detail  > agree with a/p as outlined including changing coreg to bisoprolol in this difficult to control airway pt

## 2015-06-08 NOTE — Patient Instructions (Addendum)
Change Coreg to Bisoprolol to 5mg  daily  Follow up with Primary regarding ongoing blood pressure management .  Continue on Symbicort 2 puffs Twice daily  , rinse after use.  follow up Dr. Melvyn Novas  In 2 months and As needed   Please contact office for sooner follow up if symptoms do not improve or worsen or seek emergency care

## 2015-07-03 ENCOUNTER — Emergency Department (HOSPITAL_COMMUNITY): Payer: Medicare Other

## 2015-07-03 ENCOUNTER — Encounter: Payer: Self-pay | Admitting: Internal Medicine

## 2015-07-03 ENCOUNTER — Emergency Department (HOSPITAL_COMMUNITY)
Admission: EM | Admit: 2015-07-03 | Discharge: 2015-07-03 | Disposition: A | Discharge status: Home / Self Care | Payer: Medicare Other | Attending: Physician Assistant | Admitting: Physician Assistant

## 2015-07-03 ENCOUNTER — Encounter (HOSPITAL_COMMUNITY): Payer: Self-pay | Admitting: Emergency Medicine

## 2015-07-03 ENCOUNTER — Ambulatory Visit (INDEPENDENT_AMBULATORY_CARE_PROVIDER_SITE_OTHER): Payer: Medicare Other | Admitting: Internal Medicine

## 2015-07-03 ENCOUNTER — Telehealth: Payer: Self-pay | Admitting: Internal Medicine

## 2015-07-03 VITALS — BP 104/58 | HR 42 | Temp 98.5°F | Resp 12 | Wt 186.0 lb

## 2015-07-03 DIAGNOSIS — I10 Essential (primary) hypertension: Secondary | ICD-10-CM | POA: Diagnosis not present

## 2015-07-03 DIAGNOSIS — E1165 Type 2 diabetes mellitus with hyperglycemia: Secondary | ICD-10-CM | POA: Diagnosis not present

## 2015-07-03 DIAGNOSIS — Z872 Personal history of diseases of the skin and subcutaneous tissue: Secondary | ICD-10-CM | POA: Insufficient documentation

## 2015-07-03 DIAGNOSIS — E11319 Type 2 diabetes mellitus with unspecified diabetic retinopathy without macular edema: Secondary | ICD-10-CM

## 2015-07-03 DIAGNOSIS — Z791 Long term (current) use of non-steroidal anti-inflammatories (NSAID): Secondary | ICD-10-CM | POA: Diagnosis not present

## 2015-07-03 DIAGNOSIS — Z7982 Long term (current) use of aspirin: Secondary | ICD-10-CM | POA: Diagnosis not present

## 2015-07-03 DIAGNOSIS — R001 Bradycardia, unspecified: Secondary | ICD-10-CM | POA: Diagnosis not present

## 2015-07-03 DIAGNOSIS — J45909 Unspecified asthma, uncomplicated: Secondary | ICD-10-CM | POA: Insufficient documentation

## 2015-07-03 DIAGNOSIS — Z8719 Personal history of other diseases of the digestive system: Secondary | ICD-10-CM | POA: Diagnosis not present

## 2015-07-03 DIAGNOSIS — G8929 Other chronic pain: Secondary | ICD-10-CM | POA: Insufficient documentation

## 2015-07-03 DIAGNOSIS — Z87828 Personal history of other (healed) physical injury and trauma: Secondary | ICD-10-CM | POA: Diagnosis not present

## 2015-07-03 DIAGNOSIS — F419 Anxiety disorder, unspecified: Secondary | ICD-10-CM | POA: Diagnosis not present

## 2015-07-03 DIAGNOSIS — Z91040 Latex allergy status: Secondary | ICD-10-CM | POA: Insufficient documentation

## 2015-07-03 DIAGNOSIS — Z79899 Other long term (current) drug therapy: Secondary | ICD-10-CM | POA: Insufficient documentation

## 2015-07-03 DIAGNOSIS — E119 Type 2 diabetes mellitus without complications: Secondary | ICD-10-CM | POA: Insufficient documentation

## 2015-07-03 DIAGNOSIS — IMO0002 Reserved for concepts with insufficient information to code with codable children: Secondary | ICD-10-CM

## 2015-07-03 LAB — BASIC METABOLIC PANEL
ANION GAP: 9 (ref 5–15)
BUN: 14 mg/dL (ref 6–20)
CHLORIDE: 104 mmol/L (ref 101–111)
CO2: 30 mmol/L (ref 22–32)
Calcium: 9.3 mg/dL (ref 8.9–10.3)
Creatinine, Ser: 0.85 mg/dL (ref 0.44–1.00)
GFR calc non Af Amer: >60 mL/min (ref >60)
Glucose, Bld: 102 mg/dL — ABNORMAL HIGH (ref 65–99)
Potassium: 3.6 mmol/L (ref 3.5–5.1)
Sodium: 143 mmol/L (ref 135–145)

## 2015-07-03 LAB — I-STAT TROPONIN, ED: Troponin i, poc: 0 ng/mL (ref 0.00–0.08)

## 2015-07-03 LAB — CBC
HCT: 39.8 % (ref 36.0–46.0)
Hemoglobin: 12.7 g/dL (ref 12.0–15.0)
MCH: 27.3 pg (ref 26.0–34.0)
MCHC: 31.9 g/dL (ref 30.0–36.0)
MCV: 85.6 fL (ref 78.0–100.0)
Platelets: 262 10*3/uL (ref 150–400)
RBC: 4.65 MIL/uL (ref 3.87–5.11)
RDW: 13.3 % (ref 11.5–15.5)
WBC: 11.1 10*3/uL — ABNORMAL HIGH (ref 4.0–10.5)

## 2015-07-03 MED ORDER — ALBUTEROL SULFATE HFA 108 (90 BASE) MCG/ACT IN AERS: 1.0000 | INHALATION_SPRAY | Freq: Four times a day (QID) | RESPIRATORY_TRACT | Status: DC | PRN

## 2015-07-03 MED ORDER — POTASSIUM CHLORIDE CRYS ER 20 MEQ PO TBCR: 20.0000 meq | EXTENDED_RELEASE_TABLET | Freq: Every day | ORAL | Status: DC

## 2015-07-03 MED ORDER — ALBUTEROL SULFATE HFA 108 (90 BASE) MCG/ACT IN AERS: 2.0000 | INHALATION_SPRAY | RESPIRATORY_TRACT | Status: DC | PRN

## 2015-07-03 MED ORDER — GLIPIZIDE ER 2.5 MG PO TB24: 2.5000 mg | ORAL_TABLET | Freq: Every day | ORAL | Status: DC

## 2015-07-03 MED ORDER — ASPIRIN 81 MG PO CHEW
324.0000 mg | CHEWABLE_TABLET | Freq: Once | ORAL | Status: DC
  Filled 2015-07-03: qty 4

## 2015-07-03 NOTE — ED Notes (Signed)
Pt's family states she has been "breaking out in a sweat" in the last few weeks and they'll check the pt's CBG and it's been low at those times.  Pt c/o this past Sunday of having tightness to LT chest/LT arm and some slight weakness but otherwise denies any pain or shob.
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
## 2015-07-03 NOTE — Discharge Instructions (Signed)
Discontinue taking your Bisoprolol

## 2015-07-03 NOTE — Telephone Encounter (Signed)
Patient Name: Pamela Morgan  DOB: 28-Feb-1946    Initial Comment caller states her mother s bp is 118/74 and her HR is 28 - thinks this is too low - is on a new medication   Nurse Assessment  Nurse: Mallie Mussel, RN, Alveta Heimlich Date/Time Eilene Ghazi Time): 07/03/2015 12:20:26 PM  Confirm and document reason for call. If symptomatic, describe symptoms. ---Caller states that her mother was seen this morning by her endocrinologist and her BP was 118/74 and her heart rate was 41. The endocrinologist recommended she call her PCP about this. Current heart rate is 48 bpm. She denies feeling weak, dizzy and light headed. She is on a new medication Bisoprolol which she began Saturday. She was on Carvedolol prior.  Has the patient traveled out of the country within the last 30 days? ---No  Does the patient require triage? ---Yes  Related visit to physician within the last 2 weeks? ---Yes  Does the PT have any chronic conditions? (i.e. diabetes, asthma, etc.) ---Yes  List chronic conditions. ---Diabetes, Asthma, HTN, Allergies     Guidelines    Guideline Title Affirmed Question Affirmed Notes  Heart Rate and Heartbeat Questions Heart beating very slowly (e.g., < 50 / minute) (Exception: athlete)    Final Disposition User   Go to ED Now Mallie Mussel, RN, [place]    Referrals  [facility] - ED   Disagree/Comply: Comply
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
## 2015-07-03 NOTE — Patient Instructions (Signed)
Please continue to stay off Levemir. Continue Januvia 100 mg in am. Stop Glipizide ER 5 mg and start 2.5 mg in am.  Please return in 1.5 months with your sugar log.

## 2015-07-03 NOTE — ED Notes (Signed)
Pt states she was at PCP and sent over for bradycardia. Sinus brady 40 on monitor. Pt A&O x4, denies any symptoms. Recently prescribed new beta blocker.

## 2015-07-03 NOTE — ED Notes (Signed)
RN WILL COLLECT BLOOD SAMPLES,WHEN THEY START AN IV

## 2015-07-17 ENCOUNTER — Other Ambulatory Visit (INDEPENDENT_AMBULATORY_CARE_PROVIDER_SITE_OTHER): Payer: Medicare Other

## 2015-07-17 ENCOUNTER — Encounter: Payer: Self-pay | Admitting: Internal Medicine

## 2015-07-17 ENCOUNTER — Ambulatory Visit (INDEPENDENT_AMBULATORY_CARE_PROVIDER_SITE_OTHER): Payer: Medicare Other | Admitting: Internal Medicine

## 2015-07-17 VITALS — BP 130/68 | HR 48 | Temp 98.6°F | Ht 63.0 in | Wt 186.0 lb

## 2015-07-17 DIAGNOSIS — E11319 Type 2 diabetes mellitus with unspecified diabetic retinopathy without macular edema: Secondary | ICD-10-CM | POA: Diagnosis not present

## 2015-07-17 DIAGNOSIS — E1165 Type 2 diabetes mellitus with hyperglycemia: Secondary | ICD-10-CM | POA: Diagnosis not present

## 2015-07-17 DIAGNOSIS — I10 Essential (primary) hypertension: Secondary | ICD-10-CM

## 2015-07-17 DIAGNOSIS — Z0000 Encounter for general adult medical examination without abnormal findings: Secondary | ICD-10-CM | POA: Diagnosis not present

## 2015-07-17 DIAGNOSIS — E669 Obesity, unspecified: Secondary | ICD-10-CM | POA: Diagnosis not present

## 2015-07-17 DIAGNOSIS — M545 Low back pain, unspecified: Secondary | ICD-10-CM

## 2015-07-17 DIAGNOSIS — IMO0002 Reserved for concepts with insufficient information to code with codable children: Secondary | ICD-10-CM

## 2015-07-17 LAB — LIPID PANEL
CHOL/HDL RATIO: 3
Cholesterol: 195 mg/dL (ref 0–200)
HDL: 71.9 mg/dL (ref >39.00)
LDL CALC: 109 mg/dL — AB (ref 0–99)
NONHDL: 122.81
Triglycerides: 71 mg/dL (ref 0.0–149.0)
VLDL: 14.2 mg/dL (ref 0.0–40.0)

## 2015-07-17 LAB — HEMOGLOBIN A1C: Hgb A1c MFr Bld: 7.2 % — ABNORMAL HIGH (ref 4.6–6.5)

## 2015-07-17 MED ORDER — BUDESONIDE-FORMOTEROL FUMARATE 80-4.5 MCG/ACT IN AERO: 2.0000 | INHALATION_SPRAY | Freq: Two times a day (BID) | RESPIRATORY_TRACT | Status: DC

## 2015-07-17 MED ORDER — FAMOTIDINE 20 MG PO TABS: 20.0000 mg | ORAL_TABLET | Freq: Every day | ORAL | Status: DC

## 2015-07-17 NOTE — Patient Instructions (Signed)
It was good to see you today.  We have reviewed your prior records including labs and tests today  Health Maintenance reviewed - flu declined due to intolerance - all other all recommended immunizations and age-appropriate screenings are up-to-date.  Test(s) ordered today. Your results will be released to [place] (or called to you) after review, usually within 72hours after test completion. If any changes need to be made, you will be notified at that same time.  Medications reviewed and updated, no changes recommended at this time.  Continue working with endocrinology and orthopedic specialist for rescheduling back surgery as planned when diabetes improved  Please schedule followup in 6 months for semiannual exam and labs, call sooner if problems.
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
## 2015-07-17 NOTE — Assessment & Plan Note (Signed)
Lab Results  Component Value Date   HGBA1C 10.7* 05/07/2015  long hx noncompliance reviewed today, variable  historically complicated by multiple drug intol and allergies metaglip increased to twice daily August 2014 when A1c not goal, but noncompliant with same Declined to start Levemir summer 2015 Amaryl added to ongoing Januvia by endo eval Pamela Morgan 06/12/14,  Changed endo to [facility] - now on glip in place of amaryl and did start Lantus 20 HS summer 2016 No hypoglycemia on home cbgs review The patient is asked to make an attempt to improve diet and exercise patterns to aid in medical management of this problem.

## 2015-07-17 NOTE — Assessment & Plan Note (Signed)
Wt Readings from Last 3 Encounters:  07/17/15 186 lb (84.369 kg)  07/03/15 186 lb (84.369 kg)  06/08/15 183 lb (83.008 kg)   The patient is asked to make an attempt to improve diet and exercise patterns to aid in medical management of this problem.
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
## 2015-07-17 NOTE — Progress Notes (Signed)
Pre visit review using our clinic review tool, if applicable. No additional management support is needed unless otherwise documented below in the visit note. 

## 2015-07-17 NOTE — Assessment & Plan Note (Signed)
BP Readings from Last 3 Encounters:  07/17/15 130/68  07/03/15 162/68  07/03/15 104/58   subop control, chronic Reports well controlled at home Increased amlodipine to 5 mg daily 05/2013 continue max ARB/hct, changed from losartan to valsartan 12/2014 by pulm for concern of cough Avoid BB due to bradycardia - stopped bisoprolol 06/2015 at ED visit for HR 42 (asymptomatic presently)

## 2015-07-26 ENCOUNTER — Other Ambulatory Visit: Payer: Self-pay | Admitting: Surgical

## 2015-08-02 ENCOUNTER — Encounter: Payer: Self-pay | Admitting: Internal Medicine

## 2015-08-02 ENCOUNTER — Ambulatory Visit (INDEPENDENT_AMBULATORY_CARE_PROVIDER_SITE_OTHER): Payer: Medicare Other | Admitting: Internal Medicine

## 2015-08-02 VITALS — BP 130/84 | HR 61 | Temp 98.2°F | Ht 63.0 in | Wt 187.0 lb

## 2015-08-02 DIAGNOSIS — I10 Essential (primary) hypertension: Secondary | ICD-10-CM | POA: Diagnosis not present

## 2015-08-02 DIAGNOSIS — E1165 Type 2 diabetes mellitus with hyperglycemia: Secondary | ICD-10-CM | POA: Diagnosis not present

## 2015-08-02 DIAGNOSIS — T783XXA Angioneurotic edema, initial encounter: Secondary | ICD-10-CM | POA: Diagnosis not present

## 2015-08-02 DIAGNOSIS — E11319 Type 2 diabetes mellitus with unspecified diabetic retinopathy without macular edema: Secondary | ICD-10-CM | POA: Diagnosis not present

## 2015-08-02 MED ORDER — PREDNISONE 10 MG PO TABS: ORAL_TABLET | ORAL | Status: DC

## 2015-08-02 NOTE — Progress Notes (Signed)
Pre visit review using our clinic review tool, if applicable. No additional management support is needed unless otherwise documented below in the visit note. 

## 2015-08-02 NOTE — Patient Instructions (Signed)
Please take all new medication as prescribed - the prednisone  You can also take OTC  Benadryl cream topically for the itchy areas  Please continue all other medications as before, and refills have been done if requested.  Please have the pharmacy call with any other refills you may need.  Please keep your appointments with your specialists as you may have planned  Please return in 3 months, or sooner if needed

## 2015-08-03 NOTE — Assessment & Plan Note (Signed)
C/w localized area of angioedema to right upper back, agree likely related to a localized reaction possibly insect bite related, d/w pt - for limited prednisone pack, and benadryl cream prn,  to f/u any worsening symptoms or concerns

## 2015-08-03 NOTE — Assessment & Plan Note (Signed)
stable overall by history and exam, recent data reviewed with pt, and pt to continue medical treatment as before,  to f/u any worsening symptoms or concerns BP Readings from Last 3 Encounters:  08/02/15 130/84  07/17/15 130/68  07/03/15 162/68
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
## 2015-08-03 NOTE — Assessment & Plan Note (Addendum)
stable overall by history and exam, recent data reviewed with pt, and pt to continue medical treatment as before,  to f/u any worsening symptoms or concerns Lab Results  Component Value Date   HGBA1C 7.2* 07/17/2015   Pt to call for onset polys with steroid tx or cbg > 200

## 2015-08-07 ENCOUNTER — Encounter (HOSPITAL_COMMUNITY)
Admission: RE | Admit: 2015-08-07 | Discharge: 2015-08-07 | Disposition: A | Discharge status: Home / Self Care | Payer: Medicare Other | Source: Ambulatory Visit | Attending: Orthopedic Surgery | Admitting: Orthopedic Surgery

## 2015-08-07 ENCOUNTER — Encounter (HOSPITAL_COMMUNITY): Payer: Self-pay

## 2015-08-07 ENCOUNTER — Ambulatory Visit (HOSPITAL_COMMUNITY)
Admission: RE | Admit: 2015-08-07 | Discharge: 2015-08-07 | Disposition: A | Discharge status: Home / Self Care | Payer: Medicare Other | Source: Ambulatory Visit | Attending: Surgical | Admitting: Surgical

## 2015-08-07 DIAGNOSIS — Z6833 Body mass index (BMI) 33.0-33.9, adult: Secondary | ICD-10-CM | POA: Insufficient documentation

## 2015-08-07 DIAGNOSIS — M5137 Other intervertebral disc degeneration, lumbosacral region: Secondary | ICD-10-CM | POA: Insufficient documentation

## 2015-08-07 DIAGNOSIS — Z01818 Encounter for other preprocedural examination: Secondary | ICD-10-CM | POA: Insufficient documentation

## 2015-08-07 DIAGNOSIS — J45909 Unspecified asthma, uncomplicated: Secondary | ICD-10-CM | POA: Diagnosis not present

## 2015-08-07 DIAGNOSIS — Z01812 Encounter for preprocedural laboratory examination: Secondary | ICD-10-CM | POA: Diagnosis not present

## 2015-08-07 DIAGNOSIS — F419 Anxiety disorder, unspecified: Secondary | ICD-10-CM | POA: Insufficient documentation

## 2015-08-07 DIAGNOSIS — J309 Allergic rhinitis, unspecified: Secondary | ICD-10-CM | POA: Diagnosis not present

## 2015-08-07 DIAGNOSIS — M5136 Other intervertebral disc degeneration, lumbar region: Secondary | ICD-10-CM | POA: Insufficient documentation

## 2015-08-07 DIAGNOSIS — E11319 Type 2 diabetes mellitus with unspecified diabetic retinopathy without macular edema: Secondary | ICD-10-CM | POA: Insufficient documentation

## 2015-08-07 DIAGNOSIS — E785 Hyperlipidemia, unspecified: Secondary | ICD-10-CM | POA: Diagnosis not present

## 2015-08-07 DIAGNOSIS — L272 Dermatitis due to ingested food: Secondary | ICD-10-CM | POA: Insufficient documentation

## 2015-08-07 DIAGNOSIS — L259 Unspecified contact dermatitis, unspecified cause: Secondary | ICD-10-CM | POA: Diagnosis not present

## 2015-08-07 DIAGNOSIS — E669 Obesity, unspecified: Secondary | ICD-10-CM | POA: Insufficient documentation

## 2015-08-07 DIAGNOSIS — I10 Essential (primary) hypertension: Secondary | ICD-10-CM | POA: Diagnosis not present

## 2015-08-07 LAB — CBC WITH DIFFERENTIAL/PLATELET
Basophils Absolute: 0 10*3/uL (ref 0.0–0.1)
Basophils Relative: 0 %
Eosinophils Absolute: 0.1 10*3/uL (ref 0.0–0.7)
Eosinophils Relative: 1 %
HCT: 38.7 % (ref 36.0–46.0)
Hemoglobin: 12.6 g/dL (ref 12.0–15.0)
Lymphocytes Relative: 17 %
Lymphs Abs: 2.1 10*3/uL (ref 0.7–4.0)
MCH: 27.9 pg (ref 26.0–34.0)
MCHC: 32.6 g/dL (ref 30.0–36.0)
MCV: 85.6 fL (ref 78.0–100.0)
Monocytes Absolute: 0.8 10*3/uL (ref 0.1–1.0)
Monocytes Relative: 6 %
Neutro Abs: 9.7 10*3/uL — ABNORMAL HIGH (ref 1.7–7.7)
Neutrophils Relative %: 76 %
Platelets: 267 10*3/uL (ref 150–400)
RBC: 4.52 MIL/uL (ref 3.87–5.11)
RDW: 13 % (ref 11.5–15.5)
WBC: 12.8 10*3/uL — ABNORMAL HIGH (ref 4.0–10.5)

## 2015-08-07 LAB — COMPREHENSIVE METABOLIC PANEL
ALT: 25 U/L (ref 14–54)
AST: 27 U/L (ref 15–41)
Albumin: 4.1 g/dL (ref 3.5–5.0)
Alkaline Phosphatase: 76 U/L (ref 38–126)
Anion gap: 6 (ref 5–15)
BUN: 12 mg/dL (ref 6–20)
CO2: 30 mmol/L (ref 22–32)
Calcium: 8.8 mg/dL — ABNORMAL LOW (ref 8.9–10.3)
Chloride: 104 mmol/L (ref 101–111)
Creatinine, Ser: 0.81 mg/dL (ref 0.44–1.00)
GFR calc Af Amer: >60 mL/min (ref >60)
GFR calc non Af Amer: >60 mL/min (ref >60)
Glucose, Bld: 120 mg/dL — ABNORMAL HIGH (ref 65–99)
Potassium: 3.4 mmol/L — ABNORMAL LOW (ref 3.5–5.1)
Sodium: 140 mmol/L (ref 135–145)
Total Bilirubin: 0.5 mg/dL (ref 0.3–1.2)
Total Protein: 6.9 g/dL (ref 6.5–8.1)

## 2015-08-07 LAB — SURGICAL PCR SCREEN
MRSA, PCR: NEGATIVE
STAPHYLOCOCCUS AUREUS: NEGATIVE

## 2015-08-07 LAB — PROTIME-INR
INR: 1.05 (ref 0.00–1.49)
Prothrombin Time: 13.9 seconds (ref 11.6–15.2)

## 2015-08-07 LAB — ABO/RH: ABO/RH(D): O POS

## 2015-08-07 NOTE — Patient Instructions (Signed)
Pamela Morgan  08/07/2015   Your procedure is scheduled on: Thursday 08/16/2015  Report to [facility] Main  Entrance take [place]  elevators to 3rd floor to  [place] at 1040 AM.  Call this number if you have problems the morning of surgery [phone]   Remember: ONLY 1 PERSON MAY GO WITH YOU TO SHORT STAY TO GET  READY MORNING OF [place].  Do not eat food or drink liquids :After Midnight.     Take these medicines the morning of surgery with A SIP OF WATER: AMLODIPINE, PROTONIX, EYE DROPS, SYMBICORT INHALER, DUONEB INHALER IF NEEDED               DO NOT TAKE ANY DIABETIC MEDICATIONS DAY OF YOUR SURGERY!                               You may not have any metal on your body including hair pins and              piercings  Do not wear jewelry, make-up, lotions, powders or perfumes, deodorant             Do not wear nail polish.  Do not shave  48 hours prior to surgery.              Men may shave face and neck.   Do not bring valuables to the hospital. [place].  Contacts, dentures or bridgework may not be worn into surgery.  Leave suitcase in the car. After surgery it may be brought to your room.     Patients discharged the day of surgery will not be allowed to drive home.  Name and phone number of your driver:  Special Instructions: N/A              Please read over the following fact sheets you were given: _____________________________________________________________________             [facility] - Preparing for Surgery Before surgery, you can play an important role.  Because skin is not sterile, your skin needs to be as free of germs as possible.  You can reduce the number of germs on your skin by washing with CHG (chlorahexidine gluconate) soap before surgery.  CHG is an antiseptic cleaner which kills germs and bonds with the skin to continue killing germs even after washing. Please DO  NOT use if you have an allergy to CHG or antibacterial soaps.  If your skin becomes reddened/irritated stop using the CHG and inform your nurse when you arrive at Short Stay. Do not shave (including legs and underarms) for at least 48 hours prior to the first CHG shower.  You may shave your face/neck. Please follow these instructions carefully:  1.  Shower with CHG Soap the night before surgery and the  morning of Surgery.  2.  If you choose to wash your hair, wash your hair first as usual with your  normal  shampoo.  3.  After you shampoo, rinse your hair and body thoroughly to remove the  shampoo.                           4.  Use CHG as you would any other liquid soap.  You can apply chg directly  to the skin and wash                       Gently with a scrungie or clean washcloth.  5.  Apply the CHG Soap to your body ONLY FROM THE NECK DOWN.   Do not use on face/ open                           Wound or open sores. Avoid contact with eyes, ears mouth and genitals (private parts).                       Wash face,  Genitals (private parts) with your normal soap.             6.  Wash thoroughly, paying special attention to the area where your surgery  will be performed.  7.  Thoroughly rinse your body with warm water from the neck down.  8.  DO NOT shower/wash with your normal soap after using and rinsing off  the CHG Soap.                9.  Pat yourself dry with a clean towel.            10.  Wear clean pajamas.            11.  Place clean sheets on your bed the night of your first shower and do not  sleep with pets. Day of Surgery : Do not apply any lotions/deodorants the morning of surgery.  Please wear clean clothes to the hospital/surgery center.  FAILURE TO FOLLOW THESE INSTRUCTIONS MAY RESULT IN THE CANCELLATION OF YOUR SURGERY PATIENT SIGNATURE_________________________________  NURSE  SIGNATURE__________________________________  ________________________________________________________________________   [name]  An incentive spirometer is a tool that can help keep your lungs clear and active. This tool measures how well you are filling your lungs with each breath. Taking long deep breaths may help reverse or decrease the chance of developing breathing (pulmonary) problems (especially infection) following:  A long period of time when you are unable to move or be active. BEFORE THE PROCEDURE   If the spirometer includes an indicator to show your best effort, your nurse or respiratory therapist will set it to a desired goal.  If possible, sit up straight or lean slightly forward. Try not to slouch.  Hold the incentive spirometer in an upright position. INSTRUCTIONS FOR USE   Sit on the edge of your bed if possible, or sit up as far as you can in bed or on a chair.  Hold the incentive spirometer in an upright position.  Breathe out normally.  Place the mouthpiece in your mouth and seal your lips tightly around it.  Breathe in slowly and as deeply as possible, raising the piston or the ball toward the top of the column.  Hold your breath for 3-5 seconds or for as long as possible. Allow the piston or ball to fall to the bottom of the column.  Remove the mouthpiece from your mouth and breathe out normally.  Rest for a few seconds and repeat Steps 1 through 7 at least 10 times every 1-2 hours when you are awake. Take your time and take a few normal breaths between deep breaths.  The spirometer may include an indicator to show your best  effort. Use the indicator as a goal to work toward during each repetition.  After each set of 10 deep breaths, practice coughing to be sure your lungs are clear. If you have an incision (the cut made at the time of surgery), support your incision when coughing by placing a pillow or rolled up towels firmly against it. Once  you are able to get out of bed, walk around indoors and cough well. You may stop using the incentive spirometer when instructed by your caregiver.  RISKS AND COMPLICATIONS  Take your time so you do not get dizzy or light-headed.  If you are in pain, you may need to take or ask for pain medication before doing incentive spirometry. It is harder to take a deep breath if you are having pain. AFTER USE  Rest and breathe slowly and easily.  It can be helpful to keep track of a log of your progress. Your caregiver can provide you with a simple table to help with this. If you are using the spirometer at home, follow these instructions: [place] IF:   You are having difficultly using the spirometer.  You have trouble using the spirometer as often as instructed.  Your pain medication is not giving enough relief while using the spirometer.  You develop fever of 100.5 F (38.1 C) or higher. SEEK IMMEDIATE MEDICAL CARE IF:   You cough up bloody sputum that had not been present before.  You develop fever of 102 F (38.9 C) or greater.  You develop worsening pain at or near the incision site. MAKE SURE YOU:   Understand these instructions.  Will watch your condition.  Will get help right away if you are not doing well or get worse. Document Released: 02/16/2007 Document Revised: 12/29/2011 Document Reviewed: 04/19/2007 [employer] Patient Information 2014 [employer], [place].   ________________________________________________________________________  WHAT IS A BLOOD TRANSFUSION? Blood Transfusion Information  A transfusion is the replacement of blood or some of its parts. Blood is made up of multiple cells which provide different functions.  Red blood cells carry oxygen and are used for blood loss replacement.  White blood cells fight against infection.  Platelets control bleeding.  Plasma helps clot blood.  Other blood products are available for specialized needs, such as  hemophilia or other clotting disorders. BEFORE THE TRANSFUSION  Who gives blood for transfusions?   Healthy volunteers who are fully evaluated to make sure their blood is safe. This is blood bank blood. Transfusion therapy is the safest it has ever been in the practice of medicine. Before blood is taken from a donor, a complete history is taken to make sure that person has no history of diseases nor engages in risky social behavior (examples are intravenous drug use or sexual activity with multiple partners). The donor's travel history is screened to minimize risk of transmitting infections, such as malaria. The donated blood is tested for signs of infectious diseases, such as HIV and hepatitis. The blood is then tested to be sure it is compatible with you in order to minimize the chance of a transfusion reaction. If you or a relative donates blood, this is often done in anticipation of surgery and is not appropriate for emergency situations. It takes many days to process the donated blood. RISKS AND COMPLICATIONS Although transfusion therapy is very safe and saves many lives, the main dangers of transfusion include:   Getting an infectious disease.  Developing a transfusion reaction. This is an allergic reaction to something in  the blood you were given. Every precaution is taken to prevent this. The decision to have a blood transfusion has been considered carefully by your caregiver before blood is given. Blood is not given unless the benefits outweigh the risks. AFTER THE TRANSFUSION  Right after receiving a blood transfusion, you will usually feel much better and more energetic. This is especially true if your red blood cells have gotten low (anemic). The transfusion raises the level of the red blood cells which carry oxygen, and this usually causes an energy increase.  The nurse administering the transfusion will monitor you carefully for complications. HOME CARE INSTRUCTIONS  No special  instructions are needed after a transfusion. You may find your energy is better. Speak with your caregiver about any limitations on activity for underlying diseases you may have. SEEK MEDICAL CARE IF:   Your condition is not improving after your transfusion.  You develop redness or irritation at the intravenous (IV) site. SEEK IMMEDIATE MEDICAL CARE IF:  Any of the following symptoms occur over the next 12 hours:  Shaking chills.  You have a temperature by mouth above 102 F (38.9 C), not controlled by medicine.  Chest, back, or muscle pain.  People around you feel you are not acting correctly or are confused.  Shortness of breath or difficulty breathing.  Dizziness and fainting.  You get a rash or develop hives.  You have a decrease in urine output.  Your urine turns a dark color or changes to pink, red, or brown. Any of the following symptoms occur over the next 10 days:  You have a temperature by mouth above 102 F (38.9 C), not controlled by medicine.  Shortness of breath.  Weakness after normal activity.  The white part of the eye turns yellow (jaundice).  You have a decrease in the amount of urine or are urinating less often.  Your urine turns a dark color or changes to pink, red, or brown. Document Released: 10/03/2000 Document Revised: 12/29/2011 Document Reviewed: 05/22/2008 [facility] Patient Information 2014 [place], [place].  _______________________________________________________________________

## 2015-08-08 ENCOUNTER — Inpatient Hospital Stay (HOSPITAL_COMMUNITY): Admission: RE | Admit: 2015-08-08 | Payer: Medicare Other | Source: Ambulatory Visit

## 2015-08-09 ENCOUNTER — Ambulatory Visit: Payer: Medicare Other | Admitting: Internal Medicine

## 2015-08-10 ENCOUNTER — Ambulatory Visit (INDEPENDENT_AMBULATORY_CARE_PROVIDER_SITE_OTHER): Payer: Medicare Other | Admitting: Internal Medicine

## 2015-08-10 ENCOUNTER — Encounter: Payer: Self-pay | Admitting: Internal Medicine

## 2015-08-10 VITALS — BP 130/62 | HR 51 | Temp 98.7°F | Wt 188.0 lb

## 2015-08-10 DIAGNOSIS — I10 Essential (primary) hypertension: Secondary | ICD-10-CM

## 2015-08-10 DIAGNOSIS — T783XXA Angioneurotic edema, initial encounter: Secondary | ICD-10-CM | POA: Diagnosis not present

## 2015-08-10 DIAGNOSIS — E1165 Type 2 diabetes mellitus with hyperglycemia: Secondary | ICD-10-CM

## 2015-08-10 DIAGNOSIS — E11319 Type 2 diabetes mellitus with unspecified diabetic retinopathy without macular edema: Secondary | ICD-10-CM | POA: Diagnosis not present

## 2015-08-10 MED ORDER — METHYLPREDNISOLONE ACETATE 80 MG/ML IJ SUSP
80.0000 mg | Freq: Once | INTRAMUSCULAR | Status: AC
  Administered 2015-08-10: 80 mg via INTRAMUSCULAR

## 2015-08-10 MED ORDER — PREDNISONE 10 MG PO TABS: ORAL_TABLET | ORAL | Status: DC
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
## 2015-08-10 NOTE — Addendum Note (Signed)
Addended by: Ander Slade on: 08/10/2015 01:42 PM   Modules accepted: Orders

## 2015-08-10 NOTE — Assessment & Plan Note (Signed)
stable overall by history and exam, recent data reviewed with pt, and pt to continue medical treatment as before,  to f/u any worsening symptoms or concerns BP Readings from Last 3 Encounters:  08/10/15 130/62  08/07/15 151/68  08/02/15 130/84

## 2015-08-10 NOTE — Progress Notes (Signed)
Pre visit review using our clinic review tool, if applicable. No additional management support is needed unless otherwise documented below in the visit note. 

## 2015-08-10 NOTE — Assessment & Plan Note (Signed)
Ok to incresae the levemir to 24-26 units for cbg > 150, to call if > 300

## 2015-08-10 NOTE — Assessment & Plan Note (Signed)
Mild recurrent in a highly atopic pt it seems, for depomedrol IM, predpac asd higher dose, zantac otc prn, consider allergy referral

## 2015-08-10 NOTE — Patient Instructions (Addendum)
You had the steroid shot today  Please take all new medication as prescribed - the higher dose prednisone  You should also take Zantac 75 OTC twice per day until rash is gone  You can increase the levemir to 24 - 26 units per day for blood sugars more than 150  Please call if your blood sugars get over 300 several times despite going up on the levemir  Please continue all other medications as before, and refills have been done if requested.  Please have the pharmacy call with any other refills you may need.  Please continue your efforts at being more active, low cholesterol diet, and weight control.  Please keep your appointments with your specialists as you may have planned  Good Luck with your Surgury next week!

## 2015-08-13 ENCOUNTER — Telehealth: Payer: Self-pay | Admitting: Internal Medicine

## 2015-08-13 MED ORDER — ONETOUCH ULTRASOFT LANCETS MISC: Status: DC

## 2015-08-13 MED ORDER — GLUCOSE BLOOD VI STRP: 1.0000 | ORAL_STRIP | Freq: Two times a day (BID) | Status: DC

## 2015-08-13 NOTE — Telephone Encounter (Signed)
Called pt spoke with daughter Pamela Morgan) verified BS monitor pt uses. Sending [facility]...Pamela Morgan

## 2015-08-13 NOTE — Telephone Encounter (Signed)
Pt called in and needs new script for her strips to check her sugar to the [place].

## 2015-08-14 ENCOUNTER — Ambulatory Visit: Payer: Medicare Other | Admitting: Internal Medicine

## 2015-08-14 DIAGNOSIS — Z0289 Encounter for other administrative examinations: Secondary | ICD-10-CM

## 2015-08-15 ENCOUNTER — Encounter: Payer: Self-pay | Admitting: *Deleted

## 2015-08-16 ENCOUNTER — Inpatient Hospital Stay (HOSPITAL_COMMUNITY): Payer: Medicare Other

## 2015-08-16 ENCOUNTER — Observation Stay (HOSPITAL_COMMUNITY)
Admission: RE | Admit: 2015-08-16 | Discharge: 2015-08-17 | Disposition: A | Discharge status: Home / Self Care | Payer: Medicare Other | Source: Ambulatory Visit | Attending: Orthopedic Surgery | Admitting: Orthopedic Surgery

## 2015-08-16 ENCOUNTER — Encounter (HOSPITAL_COMMUNITY)
Admission: RE | Disposition: A | Discharge status: Home / Self Care | Payer: Self-pay | Source: Ambulatory Visit | Attending: Orthopedic Surgery

## 2015-08-16 ENCOUNTER — Encounter (HOSPITAL_COMMUNITY): Payer: Self-pay | Admitting: *Deleted

## 2015-08-16 ENCOUNTER — Inpatient Hospital Stay (HOSPITAL_COMMUNITY): Payer: Medicare Other | Admitting: Certified Registered Nurse Anesthetist

## 2015-08-16 DIAGNOSIS — Z79899 Other long term (current) drug therapy: Secondary | ICD-10-CM | POA: Diagnosis not present

## 2015-08-16 DIAGNOSIS — Z794 Long term (current) use of insulin: Secondary | ICD-10-CM | POA: Insufficient documentation

## 2015-08-16 DIAGNOSIS — I10 Essential (primary) hypertension: Secondary | ICD-10-CM | POA: Diagnosis not present

## 2015-08-16 DIAGNOSIS — K449 Diaphragmatic hernia without obstruction or gangrene: Secondary | ICD-10-CM | POA: Diagnosis not present

## 2015-08-16 DIAGNOSIS — E785 Hyperlipidemia, unspecified: Secondary | ICD-10-CM | POA: Insufficient documentation

## 2015-08-16 DIAGNOSIS — M48062 Spinal stenosis, lumbar region with neurogenic claudication: Secondary | ICD-10-CM | POA: Diagnosis present

## 2015-08-16 DIAGNOSIS — Z7984 Long term (current) use of oral hypoglycemic drugs: Secondary | ICD-10-CM | POA: Diagnosis not present

## 2015-08-16 DIAGNOSIS — Z7982 Long term (current) use of aspirin: Secondary | ICD-10-CM | POA: Insufficient documentation

## 2015-08-16 DIAGNOSIS — E119 Type 2 diabetes mellitus without complications: Secondary | ICD-10-CM | POA: Diagnosis not present

## 2015-08-16 DIAGNOSIS — Z9889 Other specified postprocedural states: Secondary | ICD-10-CM | POA: Diagnosis not present

## 2015-08-16 DIAGNOSIS — Z419 Encounter for procedure for purposes other than remedying health state, unspecified: Secondary | ICD-10-CM

## 2015-08-16 DIAGNOSIS — J45909 Unspecified asthma, uncomplicated: Secondary | ICD-10-CM | POA: Insufficient documentation

## 2015-08-16 DIAGNOSIS — K589 Irritable bowel syndrome without diarrhea: Secondary | ICD-10-CM | POA: Insufficient documentation

## 2015-08-16 DIAGNOSIS — M5126 Other intervertebral disc displacement, lumbar region: Secondary | ICD-10-CM | POA: Diagnosis not present

## 2015-08-16 DIAGNOSIS — Z791 Long term (current) use of non-steroidal anti-inflammatories (NSAID): Secondary | ICD-10-CM | POA: Diagnosis not present

## 2015-08-16 DIAGNOSIS — M4806 Spinal stenosis, lumbar region: Secondary | ICD-10-CM | POA: Diagnosis not present

## 2015-08-16 DIAGNOSIS — M5127 Other intervertebral disc displacement, lumbosacral region: Secondary | ICD-10-CM | POA: Diagnosis not present

## 2015-08-16 HISTORY — PX: LUMBAR LAMINECTOMY/DECOMPRESSION MICRODISCECTOMY: SHX5026

## 2015-08-16 LAB — TYPE AND SCREEN
ABO/RH(D): O POS
Antibody Screen: NEGATIVE

## 2015-08-16 LAB — GLUCOSE, CAPILLARY
Glucose-Capillary: 90 mg/dL (ref 65–99)
Glucose-Capillary: 138 mg/dL — ABNORMAL HIGH (ref 65–99)
Glucose-Capillary: 139 mg/dL — ABNORMAL HIGH (ref 65–99)

## 2015-08-16 SURGERY — LUMBAR LAMINECTOMY/DECOMPRESSION MICRODISCECTOMY 1 LEVEL
Anesthesia: General | Site: Back | Laterality: Left

## 2015-08-16 MED ORDER — HYDROMORPHONE HCL 1 MG/ML IJ SOLN
0.5000 mg | INTRAMUSCULAR | Status: DC | PRN
  Administered 2015-08-16: 1 mg via INTRAVENOUS
  Filled 2015-08-16: qty 1

## 2015-08-16 MED ORDER — FENTANYL CITRATE (PF) 100 MCG/2ML IJ SOLN
INTRAMUSCULAR | Status: DC | PRN
  Administered 2015-08-16 (×2): 50 ug via INTRAVENOUS
  Administered 2015-08-16: 100 ug via INTRAVENOUS

## 2015-08-16 MED ORDER — MIDAZOLAM HCL 2 MG/2ML IJ SOLN
INTRAMUSCULAR | Status: AC
  Filled 2015-08-16: qty 4

## 2015-08-16 MED ORDER — LACTATED RINGERS IV SOLN: INTRAVENOUS | Status: DC

## 2015-08-16 MED ORDER — ONDANSETRON HCL 4 MG/2ML IJ SOLN
INTRAMUSCULAR | Status: DC | PRN
  Administered 2015-08-16: 4 mg via INTRAVENOUS

## 2015-08-16 MED ORDER — SODIUM CHLORIDE 0.9 % IR SOLN
Status: AC
  Filled 2015-08-16: qty 1

## 2015-08-16 MED ORDER — VANCOMYCIN HCL IN DEXTROSE 1-5 GM/200ML-% IV SOLN
1000.0000 mg | Freq: Once | INTRAVENOUS | Status: AC
  Administered 2015-08-16: 1000 mg via INTRAVENOUS
  Filled 2015-08-16: qty 200

## 2015-08-16 MED ORDER — FENTANYL CITRATE (PF) 250 MCG/5ML IJ SOLN
INTRAMUSCULAR | Status: AC
  Filled 2015-08-16: qty 25

## 2015-08-16 MED ORDER — LACTATED RINGERS IV SOLN
INTRAVENOUS | Status: DC
  Administered 2015-08-16 (×3): via INTRAVENOUS

## 2015-08-16 MED ORDER — PHENOL 1.4 % MT LIQD: 1.0000 | OROMUCOSAL | Status: DC | PRN

## 2015-08-16 MED ORDER — INSULIN ASPART 100 UNIT/ML ~~LOC~~ SOLN
0.0000 [IU] | Freq: Three times a day (TID) | SUBCUTANEOUS | Status: DC
  Administered 2015-08-17 (×2): 3 [IU] via SUBCUTANEOUS

## 2015-08-16 MED ORDER — PROPOFOL 10 MG/ML IV BOLUS
INTRAVENOUS | Status: DC | PRN
  Administered 2015-08-16: 160 mg via INTRAVENOUS

## 2015-08-16 MED ORDER — GLYCOPYRROLATE 0.2 MG/ML IJ SOLN
INTRAMUSCULAR | Status: DC | PRN
  Administered 2015-08-16: 0.6 mg via INTRAVENOUS

## 2015-08-16 MED ORDER — VALSARTAN-HYDROCHLOROTHIAZIDE 160-25 MG PO TABS: 1.0000 | ORAL_TABLET | Freq: Every day | ORAL | Status: DC

## 2015-08-16 MED ORDER — AMLODIPINE BESYLATE 5 MG PO TABS
5.0000 mg | ORAL_TABLET | Freq: Every day | ORAL | Status: DC
  Filled 2015-08-16: qty 1

## 2015-08-16 MED ORDER — PHENYLEPHRINE HCL 10 MG/ML IJ SOLN
INTRAMUSCULAR | Status: DC | PRN
  Administered 2015-08-16 (×2): 80 ug via INTRAVENOUS

## 2015-08-16 MED ORDER — THROMBIN 5000 UNITS EX SOLR
CUTANEOUS | Status: AC
  Filled 2015-08-16: qty 10000

## 2015-08-16 MED ORDER — LIDOCAINE HCL 1 % IJ SOLN
INTRAMUSCULAR | Status: AC
  Filled 2015-08-16: qty 20

## 2015-08-16 MED ORDER — BUDESONIDE-FORMOTEROL FUMARATE 80-4.5 MCG/ACT IN AERO
2.0000 | INHALATION_SPRAY | Freq: Two times a day (BID) | RESPIRATORY_TRACT | Status: DC
  Administered 2015-08-16 – 2015-08-17 (×2): 2 via RESPIRATORY_TRACT
  Filled 2015-08-16: qty 6.9

## 2015-08-16 MED ORDER — BUPIVACAINE-EPINEPHRINE (PF) 0.5% -1:200000 IJ SOLN
INTRAMUSCULAR | Status: AC
  Filled 2015-08-16: qty 30

## 2015-08-16 MED ORDER — SODIUM CHLORIDE 0.9 % IR SOLN
Status: DC | PRN
  Administered 2015-08-16: 500 mL

## 2015-08-16 MED ORDER — MIDAZOLAM HCL 5 MG/5ML IJ SOLN
INTRAMUSCULAR | Status: DC | PRN
  Administered 2015-08-16: 2 mg via INTRAVENOUS

## 2015-08-16 MED ORDER — VANCOMYCIN HCL IN DEXTROSE 1-5 GM/200ML-% IV SOLN
1000.0000 mg | INTRAVENOUS | Status: AC
  Administered 2015-08-16: 1000 mg via INTRAVENOUS
  Filled 2015-08-16: qty 200

## 2015-08-16 MED ORDER — PROPOFOL 10 MG/ML IV BOLUS
INTRAVENOUS | Status: AC
  Filled 2015-08-16: qty 20

## 2015-08-16 MED ORDER — METHOCARBAMOL 1000 MG/10ML IJ SOLN
500.0000 mg | Freq: Once | INTRAVENOUS | Status: AC
  Administered 2015-08-16: 500 mg via INTRAVENOUS
  Filled 2015-08-16: qty 5

## 2015-08-16 MED ORDER — TRAMADOL HCL 50 MG PO TABS
50.0000 mg | ORAL_TABLET | Freq: Four times a day (QID) | ORAL | Status: DC
  Administered 2015-08-16 – 2015-08-17 (×3): 50 mg via ORAL
  Filled 2015-08-16 (×3): qty 1

## 2015-08-16 MED ORDER — NEOSTIGMINE METHYLSULFATE 10 MG/10ML IV SOLN
INTRAVENOUS | Status: DC | PRN
  Administered 2015-08-16: 5 mg via INTRAVENOUS

## 2015-08-16 MED ORDER — MENTHOL 3 MG MT LOZG: 1.0000 | LOZENGE | OROMUCOSAL | Status: DC | PRN

## 2015-08-16 MED ORDER — POTASSIUM CHLORIDE CRYS ER 20 MEQ PO TBCR
20.0000 meq | EXTENDED_RELEASE_TABLET | Freq: Every day | ORAL | Status: DC
  Administered 2015-08-16 – 2015-08-17 (×2): 20 meq via ORAL
  Filled 2015-08-16 (×2): qty 1

## 2015-08-16 MED ORDER — FLEET ENEMA 7-19 GM/118ML RE ENEM: 1.0000 | ENEMA | Freq: Once | RECTAL | Status: DC | PRN

## 2015-08-16 MED ORDER — LORATADINE 10 MG PO TABS
10.0000 mg | ORAL_TABLET | Freq: Every day | ORAL | Status: DC
  Administered 2015-08-16 – 2015-08-17 (×2): 10 mg via ORAL
  Filled 2015-08-16 (×2): qty 1

## 2015-08-16 MED ORDER — EPHEDRINE SULFATE 50 MG/ML IJ SOLN
INTRAMUSCULAR | Status: DC | PRN
  Administered 2015-08-16: 5 mg via INTRAVENOUS
  Administered 2015-08-16 (×2): 10 mg via INTRAVENOUS

## 2015-08-16 MED ORDER — PROMETHAZINE HCL 25 MG/ML IJ SOLN
6.2500 mg | INTRAMUSCULAR | Status: DC | PRN
  Administered 2015-08-16: 12.5 mg via INTRAVENOUS

## 2015-08-16 MED ORDER — POLYETHYLENE GLYCOL 3350 17 G PO PACK: 17.0000 g | PACK | Freq: Every day | ORAL | Status: DC | PRN

## 2015-08-16 MED ORDER — PANTOPRAZOLE SODIUM 40 MG PO TBEC: 40.0000 mg | DELAYED_RELEASE_TABLET | Freq: Every day | ORAL | Status: DC | PRN

## 2015-08-16 MED ORDER — LIDOCAINE HCL (CARDIAC) 20 MG/ML IV SOLN
INTRAVENOUS | Status: AC
  Filled 2015-08-16: qty 5

## 2015-08-16 MED ORDER — LIDOCAINE HCL (CARDIAC) 20 MG/ML IV SOLN
INTRAVENOUS | Status: DC | PRN
  Administered 2015-08-16: 50 mg via INTRAVENOUS

## 2015-08-16 MED ORDER — FAMOTIDINE 20 MG PO TABS: 20.0000 mg | ORAL_TABLET | Freq: Every day | ORAL | Status: DC | PRN

## 2015-08-16 MED ORDER — BISACODYL 10 MG RE SUPP: 10.0000 mg | Freq: Every day | RECTAL | Status: DC | PRN

## 2015-08-16 MED ORDER — ONDANSETRON HCL 4 MG/2ML IJ SOLN
INTRAMUSCULAR | Status: AC
  Filled 2015-08-16: qty 2

## 2015-08-16 MED ORDER — IPRATROPIUM-ALBUTEROL 0.5-2.5 (3) MG/3ML IN SOLN: 3.0000 mL | RESPIRATORY_TRACT | Status: DC | PRN

## 2015-08-16 MED ORDER — LACTATED RINGERS IV SOLN
INTRAVENOUS | Status: DC
  Administered 2015-08-16: 11:00:00 via INTRAVENOUS

## 2015-08-16 MED ORDER — CHLORHEXIDINE GLUCONATE 4 % EX LIQD: 60.0000 mL | Freq: Once | CUTANEOUS | Status: DC

## 2015-08-16 MED ORDER — ACETAMINOPHEN 650 MG RE SUPP: 650.0000 mg | RECTAL | Status: DC | PRN

## 2015-08-16 MED ORDER — ROCURONIUM BROMIDE 100 MG/10ML IV SOLN
INTRAVENOUS | Status: DC | PRN
  Administered 2015-08-16: 10 mg via INTRAVENOUS
  Administered 2015-08-16: 40 mg via INTRAVENOUS

## 2015-08-16 MED ORDER — PROMETHAZINE HCL 25 MG/ML IJ SOLN
INTRAMUSCULAR | Status: AC
  Filled 2015-08-16: qty 1

## 2015-08-16 MED ORDER — HYDROCHLOROTHIAZIDE 25 MG PO TABS
25.0000 mg | ORAL_TABLET | Freq: Every day | ORAL | Status: DC
  Filled 2015-08-16 (×2): qty 1

## 2015-08-16 MED ORDER — IRBESARTAN 150 MG PO TABS
150.0000 mg | ORAL_TABLET | Freq: Every day | ORAL | Status: DC
  Filled 2015-08-16 (×2): qty 1

## 2015-08-16 MED ORDER — ALBUTEROL SULFATE (2.5 MG/3ML) 0.083% IN NEBU: 2.5000 mg | INHALATION_SOLUTION | RESPIRATORY_TRACT | Status: DC | PRN

## 2015-08-16 MED ORDER — THROMBIN 5000 UNITS EX SOLR
OROMUCOSAL | Status: DC | PRN
  Administered 2015-08-16: 15:00:00 via TOPICAL

## 2015-08-16 MED ORDER — ALBUTEROL SULFATE HFA 108 (90 BASE) MCG/ACT IN AERS: 1.0000 | INHALATION_SPRAY | Freq: Four times a day (QID) | RESPIRATORY_TRACT | Status: DC | PRN

## 2015-08-16 MED ORDER — BUPIVACAINE LIPOSOME 1.3 % IJ SUSP
20.0000 mL | Freq: Once | INTRAMUSCULAR | Status: AC
  Administered 2015-08-16: 20 mL
  Filled 2015-08-16: qty 20

## 2015-08-16 MED ORDER — ONDANSETRON HCL 4 MG/2ML IJ SOLN
4.0000 mg | INTRAMUSCULAR | Status: DC | PRN
Start: 2015-08-16 — End: 2015-08-17

## 2015-08-16 MED ORDER — BACITRACIN-NEOMYCIN-POLYMYXIN 400-5-5000 EX OINT
TOPICAL_OINTMENT | CUTANEOUS | Status: AC
  Filled 2015-08-16: qty 1

## 2015-08-16 MED ORDER — ACETAMINOPHEN 325 MG PO TABS
650.0000 mg | ORAL_TABLET | ORAL | Status: DC | PRN
  Administered 2015-08-17 (×2): 325 mg via ORAL
  Filled 2015-08-16 (×3): qty 2

## 2015-08-16 MED ORDER — HYDROMORPHONE HCL 1 MG/ML IJ SOLN: 0.2500 mg | INTRAMUSCULAR | Status: DC | PRN

## 2015-08-16 MED ORDER — BUPIVACAINE-EPINEPHRINE 0.5% -1:200000 IJ SOLN
INTRAMUSCULAR | Status: DC | PRN
  Administered 2015-08-16: 20 mL

## 2015-08-16 SURGICAL SUPPLY — 40 items
BAG SPEC THK2 15X12 ZIP CLS (MISCELLANEOUS) ×1
BAG ZIPLOCK 12X15 (MISCELLANEOUS) ×3 IMPLANT
CLEANER TIP ELECTROSURG 2X2 (MISCELLANEOUS) ×3 IMPLANT
DRAPE MICROSCOPE LEICA (MISCELLANEOUS) ×3 IMPLANT
DRAPE POUCH INSTRU U-SHP 10X18 (DRAPES) ×3 IMPLANT
DRAPE SHEET LG 3/4 BI-LAMINATE (DRAPES) ×3 IMPLANT
DRAPE SURG 17X11 SM STRL (DRAPES) ×3 IMPLANT
DRSG ADAPTIC 3X8 NADH LF (GAUZE/BANDAGES/DRESSINGS) ×3 IMPLANT
DRSG PAD ABDOMINAL 8X10 ST (GAUZE/BANDAGES/DRESSINGS) ×6 IMPLANT
DURAPREP 26ML APPLICATOR (WOUND CARE) ×3 IMPLANT
ELECT BLADE TIP CTD 4 INCH (ELECTRODE) ×3 IMPLANT
ELECT REM PT RETURN 9FT ADLT (ELECTROSURGICAL) ×3
ELECTRODE REM PT RTRN 9FT ADLT (ELECTROSURGICAL) ×1 IMPLANT
GAUZE SPONGE 4X4 12PLY STRL (GAUZE/BANDAGES/DRESSINGS) ×3 IMPLANT
GLOVE BIOGEL PI IND STRL 8 (GLOVE) ×1 IMPLANT
GLOVE BIOGEL PI INDICATOR 8 (GLOVE) ×2
GLOVE ECLIPSE 8.0 STRL XLNG CF (GLOVE) ×3 IMPLANT
GOWN STRL REUS W/TWL XL LVL3 (GOWN DISPOSABLE) ×6 IMPLANT
KIT BASIN OR (CUSTOM PROCEDURE TRAY) ×3 IMPLANT
KIT POSITIONING SURG ANDREWS (MISCELLANEOUS) ×3 IMPLANT
MANIFOLD NEPTUNE II (INSTRUMENTS) ×3 IMPLANT
NDL SPNL 18GX3.5 QUINCKE PK (NEEDLE) ×2 IMPLANT
NEEDLE HYPO 22GX1.5 SAFETY (NEEDLE) ×3 IMPLANT
NEEDLE SPNL 18GX3.5 QUINCKE PK (NEEDLE) ×6 IMPLANT
PACK LAMINECTOMY ORTHO (CUSTOM PROCEDURE TRAY) ×3 IMPLANT
PAD ABD 8X10 STRL (GAUZE/BANDAGES/DRESSINGS) ×2 IMPLANT
PATTIES SURGICAL .5 X.5 (GAUZE/BANDAGES/DRESSINGS) ×3 IMPLANT
PATTIES SURGICAL .75X.75 (GAUZE/BANDAGES/DRESSINGS) IMPLANT
PATTIES SURGICAL 1X1 (DISPOSABLE) IMPLANT
PEN SKIN MARKING BROAD (MISCELLANEOUS) ×3 IMPLANT
SPONGE LAP 4X18 X RAY DECT (DISPOSABLE) ×2 IMPLANT
SPONGE SURGIFOAM ABS GEL 100 (HEMOSTASIS) ×3 IMPLANT
STAPLER VISISTAT 35W (STAPLE) ×3 IMPLANT
SUT VIC AB 0 CT1 27 (SUTURE) ×3
SUT VIC AB 0 CT1 27XBRD ANTBC (SUTURE) ×1 IMPLANT
SUT VIC AB 1 CT1 27 (SUTURE) ×9
SUT VIC AB 1 CT1 27XBRD ANTBC (SUTURE) ×3 IMPLANT
SYR 20CC LL (SYRINGE) ×3 IMPLANT
TOWEL OR 17X26 10 PK STRL BLUE (TOWEL DISPOSABLE) ×3 IMPLANT
TRAY FOLEY METER SIL LF 16FR (CATHETERS) ×2 IMPLANT

## 2015-08-16 NOTE — Anesthesia Postprocedure Evaluation (Signed)
  Anesthesia Post-op Note  Patient: Pamela Morgan  Procedure(s) Performed: Procedure(s) (LRB): COMPLETE CENTRAL DECOMPRESSION L5,S1 FOR SPINAL STENOSIS, HEMI LAMINECTOMY L4, L5 FOR SPINAL STENOSIS, FORAMINOTOMY FOR L5 ROOT, ROOTS1 ROOT BILATERAL, MICRODISCECTOMY L5,S1 LEFT (Left)  Patient Location: PACU  Anesthesia Type: General  Level of Consciousness: awake and alert   Airway and Oxygen Therapy: Patient Spontanous Breathing  Post-op Pain: mild  Post-op Assessment: Post-op Vital signs reviewed, Patient's Cardiovascular Status Stable, Respiratory Function Stable, Patent Airway and No signs of Nausea or vomiting  Last Vitals:  Filed Vitals:   08/16/15 1629  BP: 116/68  Pulse: 50  Temp: 36.4 C  Resp:     Post-op Vital Signs: stable   Complications: No apparent anesthesia complications

## 2015-08-16 NOTE — Progress Notes (Signed)
Patient does not like her bed in the low position. Patient and patient's daughter educated about the safety issue and importance of keeping the bed in the lowest position. Nurse tech reminded patient as well. On returning to patient's room, bed had been raised up again. Patient and patient's re-educated again.
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
## 2015-08-16 NOTE — Anesthesia Procedure Notes (Signed)
Procedure Name: Intubation Performed by: Gean Maidens Pre-anesthesia Checklist: Patient identified, Emergency Drugs available, Suction available and Patient being monitored Patient Re-evaluated:Patient Re-evaluated prior to inductionOxygen Delivery Method: Circle system utilized Preoxygenation: Pre-oxygenation with 100% oxygen Intubation Type: IV induction Ventilation: Mask ventilation without difficulty Laryngoscope Size: Mac and 3 Grade View: Grade I Tube type: Oral Tube size: 7.0 mm Number of attempts: 1 Airway Equipment and Method: Stylet Placement Confirmation: ETT inserted through vocal cords under direct vision,  positive ETCO2,  CO2 detector and breath sounds checked- equal and bilateral Secured at: 21 cm Tube secured with: Tape Dental Injury: Teeth and Oropharynx as per pre-operative assessment

## 2015-08-16 NOTE — Progress Notes (Signed)
Pharmacy Note: Vancomycin Indication: surgical prophylaxis  69 yoF now s/p Central Decompressive Lumbar Laminectomy and Microdiscectomy at L-4-L-5 for HNP and Spinal Stenosis.  Patient received Vancomycin 1g pre-op and pharmacy has been consulted with instructions to order Vancomycin 1g 12 hours x 1 dose post-op unless patient has a drain, then to continue dosing per pharmacy until discontinued by provider.  Patient does not have drain in place post-op.  Plan: Vancomycin 1g IV x 1 12 hours from pre-op dose.  Will be scheduled for this evening. Pharmacy will sign off.  Please re-consult if needed.  Hershal Coria, PharmD, BCPS Pager: [phone] 08/16/2015 5:24 PM

## 2015-08-16 NOTE — Transfer of Care (Signed)
Immediate Anesthesia Transfer of Care Note  Patient: Pamela Morgan  Procedure(s) Performed: Procedure(s): COMPLETE CENTRAL DECOMPRESSION L5,S1 FOR SPINAL STENOSIS, HEMI LAMINECTOMY L4, L5 FOR SPINAL STENOSIS, FORAMINOTOMY FOR L5 ROOT, ROOTS1 ROOT BILATERAL, MICRODISCECTOMY L5,S1 LEFT (Left)  Patient Location: PACU  Anesthesia Type:General  Level of Consciousness:  sedated, patient cooperative and responds to stimulation  Airway & Oxygen Therapy:Patient Spontanous Breathing and Patient connected to face mask oxgen  Post-op Assessment:  Report given to PACU RN and Post -op Vital signs reviewed and stable  Post vital signs:  Reviewed and stable  Last Vitals:  Filed Vitals:   08/16/15 0956  BP: 154/71  Pulse: 50  Temp: 36.6 C  Resp: 18    Complications: No apparent anesthesia complications

## 2015-08-16 NOTE — Brief Op Note (Signed)
08/16/2015  3:07 PM  PATIENT:  Pamela Morgan  69 y.o. 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  SURGEON:  Surgeon(s) and Role:    * Latanya Maudlin, MD - Primary    * Magnus Sinning, MD - Assisting    ASSISTANTS: Tarri Glenn MD  ANESTHESIA:   general  EBL:  Total I/O In: 1000 [I.V.:1000] Out: 350 [Urine:300; Blood:50]  BLOOD ADMINISTERED:none  DRAINS: none   LOCAL MEDICATIONS USED:  MARCAINE 20cc of 0.50% with Epinephrine at the start of the case and 20cc of Exparel at the end of the case.    SPECIMEN:  Source of Specimen:  L-5-S-1 Interspace.  DISPOSITION OF SPECIMEN:  PATHOLOGY  COUNTS:  YES  TOURNIQUET:  * No tourniquets in log *  DICTATION: .Other Dictation: Dictation Number    PLAN OF CARE: Admit for overnight observation  PATIENT DISPOSITION:  PACU - hemodynamically stable.   Delay start of Pharmacological VTE agent (>24hrs) due to surgical blood loss or risk of bleeding: yes

## 2015-08-16 NOTE — Anesthesia Preprocedure Evaluation (Addendum)
Anesthesia Evaluation  Patient identified by MRN, date of birth, ID band Patient awake    Reviewed: Allergy & Precautions, NPO status , Patient's Chart, lab work & pertinent test results  Airway Mallampati: II  TM Distance: >3 FB Neck ROM: Full    Dental  (+)    Pulmonary asthma ,    Pulmonary exam normal breath sounds clear to auscultation       Cardiovascular hypertension, Pt. on medications Normal cardiovascular exam+ dysrhythmias  Rhythm:Regular Rate:Normal     Neuro/Psych PSYCHIATRIC DISORDERS  Neuromuscular disease    GI/Hepatic Neg liver ROS, hiatal hernia,   Endo/Other  diabetes, Type 2, Oral Hypoglycemic Agents, Insulin Dependent  Renal/GU negative Renal ROS  negative genitourinary   Musculoskeletal negative musculoskeletal ROS (+)   Abdominal   Peds negative pediatric ROS (+)  Hematology negative hematology ROS (+)   Anesthesia Other Findings   Reproductive/Obstetrics negative OB ROS                            Anesthesia Physical Anesthesia Plan  ASA: III  Anesthesia Plan: General   Post-op Pain Management:    Induction: Intravenous  Airway Management Planned: Oral ETT  Additional Equipment:   Intra-op Plan:   Post-operative Plan: Extubation in OR  Informed Consent: I have reviewed the patients History and Physical, chart, labs and discussed the procedure including the risks, benefits and alternatives for the proposed anesthesia with the patient or authorized representative who has indicated his/her understanding and acceptance.   Dental advisory given  Plan Discussed with: CRNA  Anesthesia Plan Comments:        Anesthesia Quick Evaluation

## 2015-08-16 NOTE — Interval H&P Note (Signed)
History and Physical Interval Note:  08/16/2015 12:48 PM  Pamela Morgan  has presented today for surgery, with the diagnosis of HERNIATED DISC  The various methods of treatment have been discussed with the patient and family. After consideration of risks, benefits and other options for treatment, the patient has consented to  Procedure(s): LEFT CENTRAL DECOMPRESSIVE LAMINECTOMY/MICRODISCECTOMY L4-L5 (Left) as a surgical intervention .  The patient's history has been reviewed, patient examined, no change in status, stable for surgery.  I have reviewed the patient's chart and labs.  Questions were answered to the patient's satisfaction.     Jayshaun Phillips A

## 2015-08-17 DIAGNOSIS — M5126 Other intervertebral disc displacement, lumbar region: Secondary | ICD-10-CM | POA: Diagnosis not present

## 2015-08-17 DIAGNOSIS — K589 Irritable bowel syndrome without diarrhea: Secondary | ICD-10-CM | POA: Diagnosis not present

## 2015-08-17 DIAGNOSIS — E119 Type 2 diabetes mellitus without complications: Secondary | ICD-10-CM | POA: Diagnosis not present

## 2015-08-17 DIAGNOSIS — J45909 Unspecified asthma, uncomplicated: Secondary | ICD-10-CM | POA: Diagnosis not present

## 2015-08-17 DIAGNOSIS — E785 Hyperlipidemia, unspecified: Secondary | ICD-10-CM | POA: Diagnosis not present

## 2015-08-17 DIAGNOSIS — I10 Essential (primary) hypertension: Secondary | ICD-10-CM | POA: Diagnosis not present

## 2015-08-17 LAB — GLUCOSE, CAPILLARY
GLUCOSE-CAPILLARY: 163 mg/dL — AB (ref 65–99)
Glucose-Capillary: 159 mg/dL — ABNORMAL HIGH (ref 65–99)
Glucose-Capillary: 163 mg/dL — ABNORMAL HIGH (ref 65–99)

## 2015-08-17 MED ORDER — TRAMADOL HCL 50 MG PO TABS: 50.0000 mg | ORAL_TABLET | Freq: Four times a day (QID) | ORAL | Status: DC | PRN

## 2015-08-17 NOTE — Evaluation (Signed)
Occupational Therapy Evaluation Patient Details Name: Pamela Morgan MRN: [id number] DOB: 1946/03/13 Today's Date: 08/17/2015    History of Present Illness 69 yo female s/p laminectomy, discectomy L5-S1; hemilaminectomy L4-L5 08/16/15. Hx of DM, HTN   Clinical Impression   Patient is s/p  Back surgery resulting in the deficits listed below (see OT Problem List).  Patient will benefit from skilled OT to increase their safety and independence with ADL and functional mobility for ADL (while adhering to their precautions) to facilitate discharge to venue listed below.      Follow Up Recommendations  No OT follow up    Equipment Recommendations  None recommended by OT    Recommendations for Other Services       Precautions / Restrictions Precautions Precautions: Fall;Back Precaution Comments: Reviewed back precautions and logroll technique Restrictions Weight Bearing Restrictions: No      Mobility Bed Mobility Overal bed mobility: Needs Assistance Bed Mobility: Sit to Supine       Sit to supine: Mod assist   General bed mobility comments: VC for log roll  Transfers Overall transfer level: Needs assistance Equipment used: Rolling walker (2 wheeled) Transfers: Sit to/from Stand Sit to Stand: Min assist         General transfer comment: VCs safety,hand placement. Mildly uncontrolled descent to recliner.         ADL Overall ADL's : Needs assistance/impaired                                       General ADL Comments: Pt overall min a with ADL activity . Educated pt and daughter regarding ADL actiivity with back precautions.  Both verbalize understanding.  Pt educated on AE and where to obtain.                 Pertinent Vitals/Pain Pain Assessment: 0-10 Pain Score: 5  Pain Location: back Pain Descriptors / Indicators: Sore Pain Intervention(s): Monitored during session;Repositioned     Hand Dominance     Extremity/Trunk Assessment  Upper Extremity Assessment Upper Extremity Assessment: Defer to OT evaluation   Lower Extremity Assessment Lower Extremity Assessment: Overall WFL for tasks assessed   Cervical / Trunk Assessment Cervical / Trunk Assessment: Normal   Communication Communication Communication: No difficulties   Cognition Arousal/Alertness: Awake/alert Behavior During Therapy: WFL for tasks assessed/performed Overall Cognitive Status: Within Functional Limits for tasks assessed                                Home Living Family/patient expects to be discharged to:: Private residence Living Arrangements: Children   Type of Home: House Home Access: Stairs to enter Technical brewer of Steps: 3 Entrance Stairs-Rails: None Home Layout: One level     Bathroom Shower/Tub: Teacher, early years/pre: Handicapped height     Home Equipment: Radio producer - single point          Prior Functioning/Environment Level of Independence: Independent                      OT Goals(Current goals can be found in the care plan section) Acute Rehab OT Goals Patient Stated Goal: to get better  OT Frequency:                End of Session Nurse Communication: Mobility status  Activity  Tolerance: Patient tolerated treatment well Patient left: in bed;with call bell/phone within reach   Time: 0938-1006 OT Time Calculation (min): 28 min Charges:  OT General Charges $OT Visit: 1 Procedure OT Evaluation $Initial OT Evaluation Tier I: 1 Procedure OT Treatments $Self Care/Home Management : 8-22 mins G-Codes: OT G-codes **NOT FOR INPATIENT CLASS** Functional Assessment Tool Used: clinical observation Functional Limitation: Self care Self Care Current Status (F0722): At least 20 percent but less than 40 percent impaired, limited or restricted Self Care Goal Status (V7505): At least 1 percent but less than 20 percent impaired, limited or restricted Self Care Discharge Status  [phone]): At least 20 percent but less than 40 percent impaired, limited or restricted  [place], Thereasa Parkin 08/17/2015, 10:33 AM

## 2015-08-17 NOTE — Op Note (Signed)
NAMEJASANI, AKRIDGE NO.:  [id number]  MEDICAL RECORD NO.:  [id number]  LOCATION:  1607                         FACILITY:  [facility]  PHYSICIAN:  Georges Lynch. Mikhaila Roh, M.D.DATE OF BIRTH:  06/11/1946  DATE OF PROCEDURE:  08/16/2015 DATE OF DISCHARGE:                              OPERATIVE REPORT   SURGEON:  Georges Lynch. Darrelyn Hillock, M.D.  OPERATIVE ASSISTANT:  Marlowe Kays, M.D.  From our MRI reading, her herniated disk and stenosis was at L4-5 and she also had mild stenosis at L3-4.  All her symptoms were on the left. She had partial weakness of her left foot.  According to the hospital x- rays, she had 6 lumbar vertebrae and according to those labeling, we labeled at the hospital as L5-S1.  So, L5-S1 on the hospital x-rays are the same as L4-5 on our MRI reading, so we get that cleared, so we are going to refer now to L5-S1 at all times as the interspace.  PREOPERATIVE DIAGNOSES: 1. Spinal stenosis at L4-5. 2. Lateral recess stenosis at L3-4 on the left. 3. 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 4. Foraminal stenosis for the L5 and the S1 roots bilaterally.  POSTOPERATIVE DIAGNOSES: 1. Spinal stenosis at L4-5. 2. Lateral recess stenosis at L3-4 on the left. 3. 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 4. Foraminal stenosis for the L5 and the S1 roots bilaterally.  OPERATION: 1. Central decompressive lumbar laminectomy at L5-S1. 2. Microdiskectomy, L5-S1 on the left. 3. Hemilaminectomy to decompress the lateral recess at L4-5 on the     left. 4. Foraminotomies for the L5 and the S1 roots bilaterally. Note prior to the surgery, I did call [facility] who read our MRIs to verify the appropriate labeling because the last vertebrae we had referenced of was L5 was the transitional vertebrae on the left, so we clarified that preop.  DESCRIPTION OF PROCEDURE:  Under general anesthesia, with the patient on a spinal frame, routine orthopedic prep and draping of  the back was carried out.  The appropriate time-out was first carried out.  I also marked the appropriate side of the back.  Even though we were going centrally, on the left, we marked because her symptoms were on the left. Later, she said she developed pain in both legs.  Two needles were placed in the back for localization purposes and an x-ray was taken.  We then made an incision over what we are going to now refer to as L5-S1, which according to our MRI of the same space is L4-5 just better labeling.  We separated the muscle from the lamina bilaterally of the spinous process.  An x-ray was taken with Kocher clamp in place.  At this time, we were at the appropriate level, so we did a central decompressive lumbar laminectomy by removing the lamina of L5.  We went down centrally and thoroughly decompressed that and then worked our way up [place], did a hemilaminectomy at L4-5 on the left, so we did foraminotomies as well.  Now, the microscope was brought in, we first went to the L5-S1 level centrally, removed the ligamentum flavum, which was quite thickened.  We inserted instruments distally and proximally and took another x-ray.  At this time, we noted that the L5-S1 level, which on MRI would be L4-5 was severely tight on that lateral recess. We had at first far laterally decompressed the lateral recess and exposed the root at that level.  We gently retracted the root, put our needle into the disk space and an x-ray was taken, which we will still call that level L5-S1.  Following that, we made a cruciate incision in the posterior longitudinal ligament and she had a definite herniated disk that was compressing the root up against the recess.  We removed the disk material, small sample was sent to the lab.  We further decompressed the recess.  We went down distally, decompressed the foramen and the root now was totally free.  Initially it was very tight. We then went up above as I  mentioned, decompressed the lateral recess above on the left and did a foraminotomy for the roots along that left side as well.  We decompressed the recess on both sides.  Foraminotomies were performed as discussed in the op note.  We now decompressed the roots, which we will refer on this procedure as L5 and S1 roots.  We thoroughly irrigated out the area and loosely applied some thrombin- soaked Gelfoam.  At the beginning of the case, I injected 20 mL of 0.5% Marcaine with epinephrine into the muscle area to cut down on the bleeding.  At the end of the case, I injected 20 mL of Exparel.  The muscle and fascial layer were closed with #1 Vicryl sutures.  I left a small distal and proximal deep parts open for drainage purposes.  Subcu was closed with 0-Vicryl, skin with metal staples.  Prior to closing the skin, I injected 20 mL of Exparel into the soft tissues.  Sterile Neosporin bundle dressing was applied to the back.  The patient had 1 g of vancomycin preop.          ______________________________ Georges Lynch. Darrelyn Hillock, M.D.     RAG/MEDQ  D:  08/16/2015  T:  08/17/2015  Job:  [id number]

## 2015-08-17 NOTE — Evaluation (Signed)
Physical Therapy Evaluation Patient Details Name: Pamela Morgan MRN: [id number] DOB: 02-12-1946 Today's Date: 08/17/2015   History of Present Illness  69 yo female s/p laminectomy, discectomy L5-S1; hemilaminectomy L4-L5 08/16/15. Hx of DM, HTN  Clinical Impression  On eval, pt required Min guard assist for ambulation distance of ~150 feet with RW and Min assist to negotiate steps. Pain rated 5/10 with activity. Pt tolerated session well. Recommend RW use at discharge.     Follow Up Recommendations No PT follow up;Supervision/Assistance - 24 hour    Equipment Recommendations  Rolling walker with 5" wheels    Recommendations for Other Services       Precautions / Restrictions Precautions Precautions: Fall;Back Precaution Comments: Reviewed back precautions and logroll technique Restrictions Weight Bearing Restrictions: No      Mobility  Bed Mobility               General bed mobility comments: pt in recliner. Verbally reviewed logroll technique. (also asked OT to practice with pt during her session)  Transfers Overall transfer level: Needs assistance   Transfers: Sit to/from Stand Sit to Stand: Supervision         General transfer comment: VCs safety,hand placement. Mildly uncontrolled descent to recliner.  Ambulation/Gait Ambulation/Gait assistance: Min guard Ambulation Distance (Feet): 150 Feet Assistive device: Rolling walker (2 wheeled) Gait Pattern/deviations: Step-through pattern;Trunk flexed     General Gait Details: close guard for safety. VCs posture, distance from walker  Stairs Stairs: Yes Stairs assistance: Min assist Stair Management: Step to pattern;Forwards;One rail Right Number of Stairs: 3 General stair comments: assist to stabilize with 1 HHA. VCs safety, sequence.   Wheelchair Mobility    Modified Rankin (Stroke Patients Only)       Balance                                             Pertinent  Vitals/Pain Pain Assessment: 0-10 Pain Score: 5  Pain Location: back  Pain Descriptors / Indicators: Sore Pain Intervention(s): Monitored during session;Repositioned    Home Living Family/patient expects to be discharged to:: Private residence Living Arrangements: Children   Type of Home: House Home Access: Stairs to enter Entrance Stairs-Rails: None Technical brewer of Steps: 3 Home Layout: One level Home Equipment: Cane - single point      Prior Function Level of Independence: Independent               Hand Dominance        Extremity/Trunk Assessment   Upper Extremity Assessment: Defer to OT evaluation           Lower Extremity Assessment: Overall WFL for tasks assessed      Cervical / Trunk Assessment: Normal  Communication   Communication: No difficulties  Cognition Arousal/Alertness: Awake/alert Behavior During Therapy: WFL for tasks assessed/performed Overall Cognitive Status: Within Functional Limits for tasks assessed                      General Comments      Exercises        Assessment/Plan    PT Assessment Patent does not need any further PT services  PT Diagnosis Difficulty walking;Acute pain   PT Problem List    PT Treatment Interventions     PT Goals (Current goals can be found in the Care Plan section) Acute Rehab PT  Goals Patient Stated Goal: to get better PT Goal Formulation: All assessment and education complete, DC therapy    Frequency     Barriers to discharge        Co-evaluation               End of Session   Activity Tolerance: Patient tolerated treatment well Patient left: in chair;with call bell/phone within reach;with family/visitor present      Functional Assessment Tool Used: clinical judgement Functional Limitation: Mobility: Walking and moving around Mobility: Walking and Moving Around Current Status ([id number]): At least 1 percent but less than 20 percent impaired, limited or  restricted Mobility: Walking and Moving Around Goal Status [phone]): At least 1 percent but less than 20 percent impaired, limited or restricted Mobility: Walking and Moving Around Discharge Status [phone]): At least 1 percent but less than 20 percent impaired, limited or restricted    Time: 0925-0937 PT Time Calculation (min) (ACUTE ONLY): 12 min   Charges:   PT Evaluation $Initial PT Evaluation Tier I: 1 Procedure     PT G Codes:   PT G-Codes **NOT FOR INPATIENT CLASS** Functional Assessment Tool Used: clinical judgement Functional Limitation: Mobility: Walking and moving around Mobility: Walking and Moving Around Current Status (M6294): At least 1 percent but less than 20 percent impaired, limited or restricted Mobility: Walking and Moving Around Goal Status [phone]): At least 1 percent but less than 20 percent impaired, limited or restricted Mobility: Walking and Moving Around Discharge Status [phone]): At least 1 percent but less than 20 percent impaired, limited or restricted    [place]on Anna, MPT Pager: [phone]

## 2015-08-17 NOTE — Care Management Note (Signed)
Case Management Note  Patient Details  Name: Pamela Morgan MRN: [id number] Date of Birth: 06-22-1946  Subjective/Objective:                    Action/Plan:d/c home w/rw.   Expected Discharge Date:                  Expected Discharge Plan:  Home/Self Care  In-House Referral:     Discharge planning Services  CM Consult  Post Acute Care Choice:    Choice offered to:     DME Arranged:  Walker rolling DME Agency:  [place]:    [place]:     Status of Service:  Completed, signed off  Medicare Important Message Given:    Date Medicare IM Given:    Medicare IM give by:    Date Additional Medicare IM Given:    Additional Medicare Important Message give by:     If discussed at [place] of Stay Meetings, dates discussed:    Additional Comments:  Dessa Phi, RN 08/17/2015, 1:56 PM

## 2015-08-17 NOTE — Discharge Instructions (Signed)
Change your dressing daily. For the first few days, remove your dressing, tape a piece of saran wrap over your incision. Take your shower, then remove the saran wrap and put a clean dressing on. After two days you can shower without the saran wrap.  Call Dr. Gladstone Lighter if any wound complications or temperature of 101 degrees F or over.  Call the office for an appointment to see Dr. Gladstone Lighter in two weeks: [phone] and ask for Dr. Charlestine Night nurse, Brunilda Payor.

## 2015-08-17 NOTE — Progress Notes (Signed)
Subjective: 1 Day Post-Op Procedure(s) (LRB): COMPLETE CENTRAL DECOMPRESSION L5,S1 FOR SPINAL STENOSIS, HEMI LAMINECTOMY L4, L5 FOR SPINAL STENOSIS, FORAMINOTOMY FOR L5 ROOT, ROOTS1 ROOT BILATERAL, MICRODISCECTOMY L5,S1 LEFT (Left) Patient reports pain as 1 on 0-10 scale.Ready fo DC    Objective: Vital signs in last 24 hours: Temp:  [97.6 F (36.4 C)-99.5 F (37.5 C)] 99.5 F (37.5 C) (10/28 0549) Pulse Rate:  [45-69] 57 (10/28 0549) Resp:  [11-18] 16 (10/28 0549) BP: (103-154)/(42-71) 103/42 mmHg (10/28 0549) SpO2:  [98 %-100 %] 98 % (10/28 0549) Weight:  [85.276 kg (188 lb)] 85.276 kg (188 lb) (10/27 1032)  Intake/Output from previous day: 10/27 0701 - 10/28 0700 In: 3978.3 [P.O.:580; I.V.:3343.3; IV Piggyback:55] Out: 6962 [XBMWU:1324; Blood:50] Intake/Output this shift:    No results for input(s): HGB in the last 72 hours. No results for input(s): WBC, RBC, HCT, PLT in the last 72 hours. No results for input(s): NA, K, CL, CO2, BUN, CREATININE, GLUCOSE, CALCIUM in the last 72 hours. No results for input(s): LABPT, INR in the last 72 hours.  Dorsiflexion/Plantar flexion intact Compartment soft  Assessment/Plan: 1 Day Post-Op Procedure(s) (LRB): COMPLETE CENTRAL DECOMPRESSION L5,S1 FOR SPINAL STENOSIS, HEMI LAMINECTOMY L4, L5 FOR SPINAL STENOSIS, FORAMINOTOMY FOR L5 ROOT, ROOTS1 ROOT BILATERAL, MICRODISCECTOMY L5,S1 LEFT (Left) Up with therapy Discharge home with home health  Caralee Morea A 08/17/2015, 7:04 AM
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
## 2015-08-31 DIAGNOSIS — M5416 Radiculopathy, lumbar region: Secondary | ICD-10-CM | POA: Diagnosis not present

## 2015-08-31 DIAGNOSIS — Z4789 Encounter for other orthopedic aftercare: Secondary | ICD-10-CM | POA: Diagnosis not present

## 2015-08-31 DIAGNOSIS — M5136 Other intervertebral disc degeneration, lumbar region: Secondary | ICD-10-CM | POA: Diagnosis not present

## 2015-09-07 DIAGNOSIS — M5416 Radiculopathy, lumbar region: Secondary | ICD-10-CM | POA: Diagnosis not present

## 2015-09-21 DIAGNOSIS — M5416 Radiculopathy, lumbar region: Secondary | ICD-10-CM | POA: Diagnosis not present

## 2015-09-26 DIAGNOSIS — M5416 Radiculopathy, lumbar region: Secondary | ICD-10-CM | POA: Diagnosis not present

## 2015-09-27 DIAGNOSIS — M5136 Other intervertebral disc degeneration, lumbar region: Secondary | ICD-10-CM | POA: Diagnosis not present

## 2015-09-27 DIAGNOSIS — Z4789 Encounter for other orthopedic aftercare: Secondary | ICD-10-CM | POA: Diagnosis not present

## 2015-10-24 ENCOUNTER — Telehealth: Payer: Self-pay | Admitting: *Deleted

## 2015-10-24 MED ORDER — VALSARTAN-HYDROCHLOROTHIAZIDE 160-25 MG PO TABS: 1.0000 | ORAL_TABLET | Freq: Every day | ORAL | Status: DC

## 2015-10-24 NOTE — Telephone Encounter (Signed)
Pt daughter states mom is needing a rx sent to [facility] for her Valsartan. Also will need a 2 wk supply sent to [facility] because she only has 2 pills left. Pt is schedule to see Dr. Sharlet Salina 3/17, sent 90 day to mail order until appt...Johny Chess

## 2015-12-07 ENCOUNTER — Encounter: Payer: Self-pay | Admitting: Nurse Practitioner

## 2015-12-07 ENCOUNTER — Ambulatory Visit (INDEPENDENT_AMBULATORY_CARE_PROVIDER_SITE_OTHER): Payer: Medicare Other | Admitting: Nurse Practitioner

## 2015-12-07 VITALS — BP 140/72 | HR 52 | Temp 98.2°F | Resp 18 | Wt 185.0 lb

## 2015-12-07 DIAGNOSIS — J069 Acute upper respiratory infection, unspecified: Secondary | ICD-10-CM

## 2015-12-07 MED ORDER — METHYLPREDNISOLONE 4 MG PO TABS: ORAL_TABLET | ORAL | Status: DC

## 2015-12-07 MED ORDER — AMOXICILLIN-POT CLAVULANATE 875-125 MG PO TABS: 1.0000 | ORAL_TABLET | Freq: Two times a day (BID) | ORAL | Status: DC

## 2015-12-07 NOTE — Progress Notes (Signed)
Pre visit review using our clinic review tool, if applicable. No additional management support is needed unless otherwise documented below in the visit note. 

## 2015-12-07 NOTE — Patient Instructions (Addendum)
Prednisone with breakfast or lunch at the latest.  6 tablets on day 1, 5 tablets on day 2, 4 tablets on day 3, 3 tablets on day 4, 2 tablets day 5, 1 tablet on day 6...done! Take tablets all together not spaced out Don't take with NSAIDs (Ibuprofen, Aleve, Naproxen, Meloxicam ect...)  Augmentin as prescribed.   Call us if no improvement after treatment in 7 days Call sooner if worsening

## 2015-12-07 NOTE — Progress Notes (Signed)
Patient ID: ASH BRACH, female    DOB: 06-15-1946  Age: 70 y.o. MRN: [id number]  CC: Shortness of Breath   HPI Pamela Morgan presents for CC of SOB, cough, chest congestion x 2 weeks.  1) Pt reports laryngitis, SOB intermittent, chest congestion Cough with white production Worsening since Wed.   Treatment to date: Albuterol inhaler  Allegra Symbicort Duoneb- used 2 x in the last week Tessalon- out of those  [place] contacts:  Denies    History Pamela Morgan has a past medical history of IBS (irritable bowel syndrome); Eczema; Hiatal hernia; ALLERGIC RHINITIS; ANGIOEDEMA (01/21/2010); ANXIETY, SITUATIONAL; ASTHMA; DIABETES MELLITUS, TYPE II; HYPERLIPIDEMIA; HYPERTENSION; LOW BACK PAIN, CHRONIC; and Dysrhythmia.   She has past surgical history that includes Hemorrhoid surgery (2007); Abdominal hysterectomy (1987); Oophorectomy (1987); Tubal ligation; and Lumbar laminectomy/decompression microdiscectomy (Left, 08/16/2015).   Her family history includes Allergies in her mother; Hypertension in her sister.She reports that she has never smoked. She has never used smokeless tobacco. She reports that she does not drink alcohol or use illicit drugs.  Outpatient Prescriptions Prior to Visit  Medication Sig Dispense Refill  . albuterol (PROVENTIL HFA;VENTOLIN HFA) 108 (90 BASE) MCG/ACT inhaler Inhale 1-2 puffs into the lungs every 6 (six) hours as needed for wheezing or shortness of breath. 1 Inhaler 0  . albuterol (PROVENTIL HFA;VENTOLIN HFA) 108 (90 BASE) MCG/ACT inhaler Inhale 2 puffs into the lungs every 4 (four) hours as needed for wheezing or shortness of breath (((PLAN B))). 1 Inhaler 1  . amLODipine (NORVASC) 5 MG tablet Take 1 tablet (5 mg total) by mouth daily. 90 tablet 3  . benzonatate (TESSALON) 100 MG capsule Take 1 capsule (100 mg total) by mouth 3 (three) times daily as needed for cough. 15 capsule 0  . budesonide-formoterol (SYMBICORT) 80-4.5 MCG/ACT inhaler Inhale 2 puffs into the  lungs 2 (two) times daily. 3 Inhaler 1  . docusate sodium (COLACE) 100 MG capsule Take 100 mg by mouth daily.    . famotidine (PEPCID) 20 MG tablet Take 1 tablet (20 mg total) by mouth daily. (Patient taking differently: Take 20 mg by mouth daily as needed for heartburn. )    . fexofenadine (ALLEGRA) 180 MG tablet Take 1 tablet (180 mg total) by mouth daily. 90 tablet 3  . glipiZIDE (GLUCOTROL XL) 2.5 MG 24 hr tablet Take 1 tablet (2.5 mg total) by mouth daily with breakfast. 60 tablet 1  . glucose blood (ONE TOUCH ULTRA TEST) test strip 1 each by Other route 2 (two) times daily. Use to check blood sugars twice a day Dx E11.9 100 each 3  . Insulin Detemir (LEVEMIR FLEXPEN) 100 UNIT/ML Pen Inject 20 Units into the skin daily at 10 pm. 15 mL 1  . ipratropium-albuterol (DUONEB) 0.5-2.5 (3) MG/3ML SOLN Take 3 mLs by nebulization every 4 (four) hours as needed (((PLAN C)) wheezing, shortness of breath).     . Lancets (ONETOUCH ULTRASOFT) lancets Use as instructed 100 each 2  . naproxen sodium (ANAPROX) 220 MG tablet Take 220 mg by mouth daily.     Marland Kitchen olopatadine (PATANOL) 0.1 % ophthalmic solution Place 1 drop into both eyes 2 (two) times daily as needed for allergies.     . pantoprazole (PROTONIX) 40 MG tablet Take 1 tablet (40 mg total) by mouth daily. Take 30-60 min before first meal of the day (Patient taking differently: Take 40 mg by mouth daily as needed (Heartburn). Take 30-60 min before first meal of the day) 30 tablet  2  . potassium chloride SA (K-DUR,KLOR-CON) 20 MEQ tablet Take 1 tablet (20 mEq total) by mouth daily. 3 tablet 0  . sitaGLIPtin (JANUVIA) 100 MG tablet Take 1 tablet (100 mg total) by mouth daily. 90 tablet 3  . traMADol (ULTRAM) 50 MG tablet Take 1-2 tablets (50-100 mg total) by mouth every 6 (six) hours as needed for moderate pain or severe pain. 80 tablet 0  . valsartan-hydrochlorothiazide (DIOVAN HCT) 160-25 MG tablet Take 1 tablet by mouth daily. Must keep 01/04/16 w/new  provider for refills 14 tablet 0  . predniSONE (DELTASONE) 10 MG tablet 4 tab by mouth x 3days,3 tab x 3days,2tabs x 3 days,1 tab x 3 days 30 tablet 0   No facility-administered medications prior to visit.    ROS Review of Systems  Constitutional: Negative for fever, chills, diaphoresis and fatigue.  HENT: Positive for congestion, postnasal drip, rhinorrhea and sneezing. Negative for ear pain, sinus pressure and sore throat.   Eyes: Negative for visual disturbance.  Respiratory: Positive for cough and shortness of breath. Negative for chest tightness and wheezing.   Cardiovascular: Negative for chest pain, palpitations and leg swelling.  Gastrointestinal: Negative for nausea, vomiting and diarrhea.  Skin: Negative for rash.  Neurological: Negative for dizziness and headaches.    Objective:  BP 140/72 mmHg  Pulse 52  Temp(Src) 98.2 F (36.8 C) (Oral)  Resp 18  Wt 185 lb (83.915 kg)  SpO2 98%  LMP 03/19/1986  Physical Exam  Constitutional: She is oriented to person, place, and time. She appears well-developed and well-nourished. No distress.  HENT:  Head: Normocephalic and atraumatic.  Right Ear: External ear normal.  Left Ear: External ear normal.  Mouth/Throat: Oropharynx is clear and moist.  Eyes: Conjunctivae and EOM are normal. Pupils are equal, round, and reactive to light. Left eye exhibits no discharge. No scleral icterus.  Neck: Normal range of motion. Neck supple.  Cardiovascular: Normal rate, regular rhythm and normal heart sounds.  Exam reveals no gallop and no friction rub.   No murmur heard. Pulmonary/Chest: Effort normal. No respiratory distress. She has wheezes. She has no rales. She exhibits no tenderness.  Lymphadenopathy:    She has no cervical adenopathy.  Neurological: She is alert and oriented to person, place, and time. No cranial nerve deficit. She exhibits normal muscle tone. Coordination normal.  Skin: Skin is warm and dry. No rash noted. She is not  diaphoretic.  Psychiatric: She has a normal mood and affect. Her behavior is normal. Judgment and thought content normal.      Assessment & Plan:   Pamela Morgan was seen today for shortness of breath.  Diagnoses and all orders for this visit:  Acute URI  Other orders -     amoxicillin-clavulanate (AUGMENTIN) 875-125 MG tablet; Take 1 tablet by mouth 2 (two) times daily. -     methylPREDNISolone (MEDROL) 4 MG tablet; Take 6 tablets by mouth with breakfast or lunch and decrease by 1 tablet each day until gone.  I have discontinued Pamela Morgan's predniSONE. I am also having her start on amoxicillin-clavulanate and methylPREDNISolone. Additionally, I am having her maintain her fexofenadine, amLODipine, naproxen sodium, ipratropium-albuterol, pantoprazole, sitaGLIPtin, olopatadine, benzonatate, Insulin Detemir, glipiZIDE, albuterol, potassium chloride SA, albuterol, budesonide-formoterol, famotidine, docusate sodium, glucose blood, onetouch ultrasoft, traMADol, and valsartan-hydrochlorothiazide.  Meds ordered this encounter  Medications  . amoxicillin-clavulanate (AUGMENTIN) 875-125 MG tablet    Sig: Take 1 tablet by mouth 2 (two) times daily.    Dispense:  14 tablet  Refill:  0    Order Specific Question:  Supervising Provider    Answer:  Deborra Medina L [[id number]]  . methylPREDNISolone (MEDROL) 4 MG tablet    Sig: Take 6 tablets by mouth with breakfast or lunch and decrease by 1 tablet each day until gone.    Dispense:  21 tablet    Refill:  0    Order Specific Question:  Supervising Provider    Answer:  Crecencio Mc [[id number]]     Follow-up: Return if symptoms worsen or fail to improve.

## 2015-12-07 NOTE — Assessment & Plan Note (Addendum)
New Onset Due to length of symptoms with worsening will treat empirically  Augmentin was sent to the pharmacy Encouraged Probiotics Continue OTC measures  Prednisone taper to decrease inflammation given  Instructions for taking done verbally and on AVS FU prn worsening/failure to improve.

## 2016-01-04 ENCOUNTER — Other Ambulatory Visit: Payer: Self-pay | Admitting: Internal Medicine

## 2016-01-04 IMAGING — CR DG CHEST 2V
2 series · 2 of 2 positions shown · non-contrast
Comparison: 04/20/2014.

CLINICAL DATA: Initial encounter for cough and congestion with
shortness of breath for 2 months.

EXAM:
CHEST  2 VIEW

[view not recorded (1 of 2)]
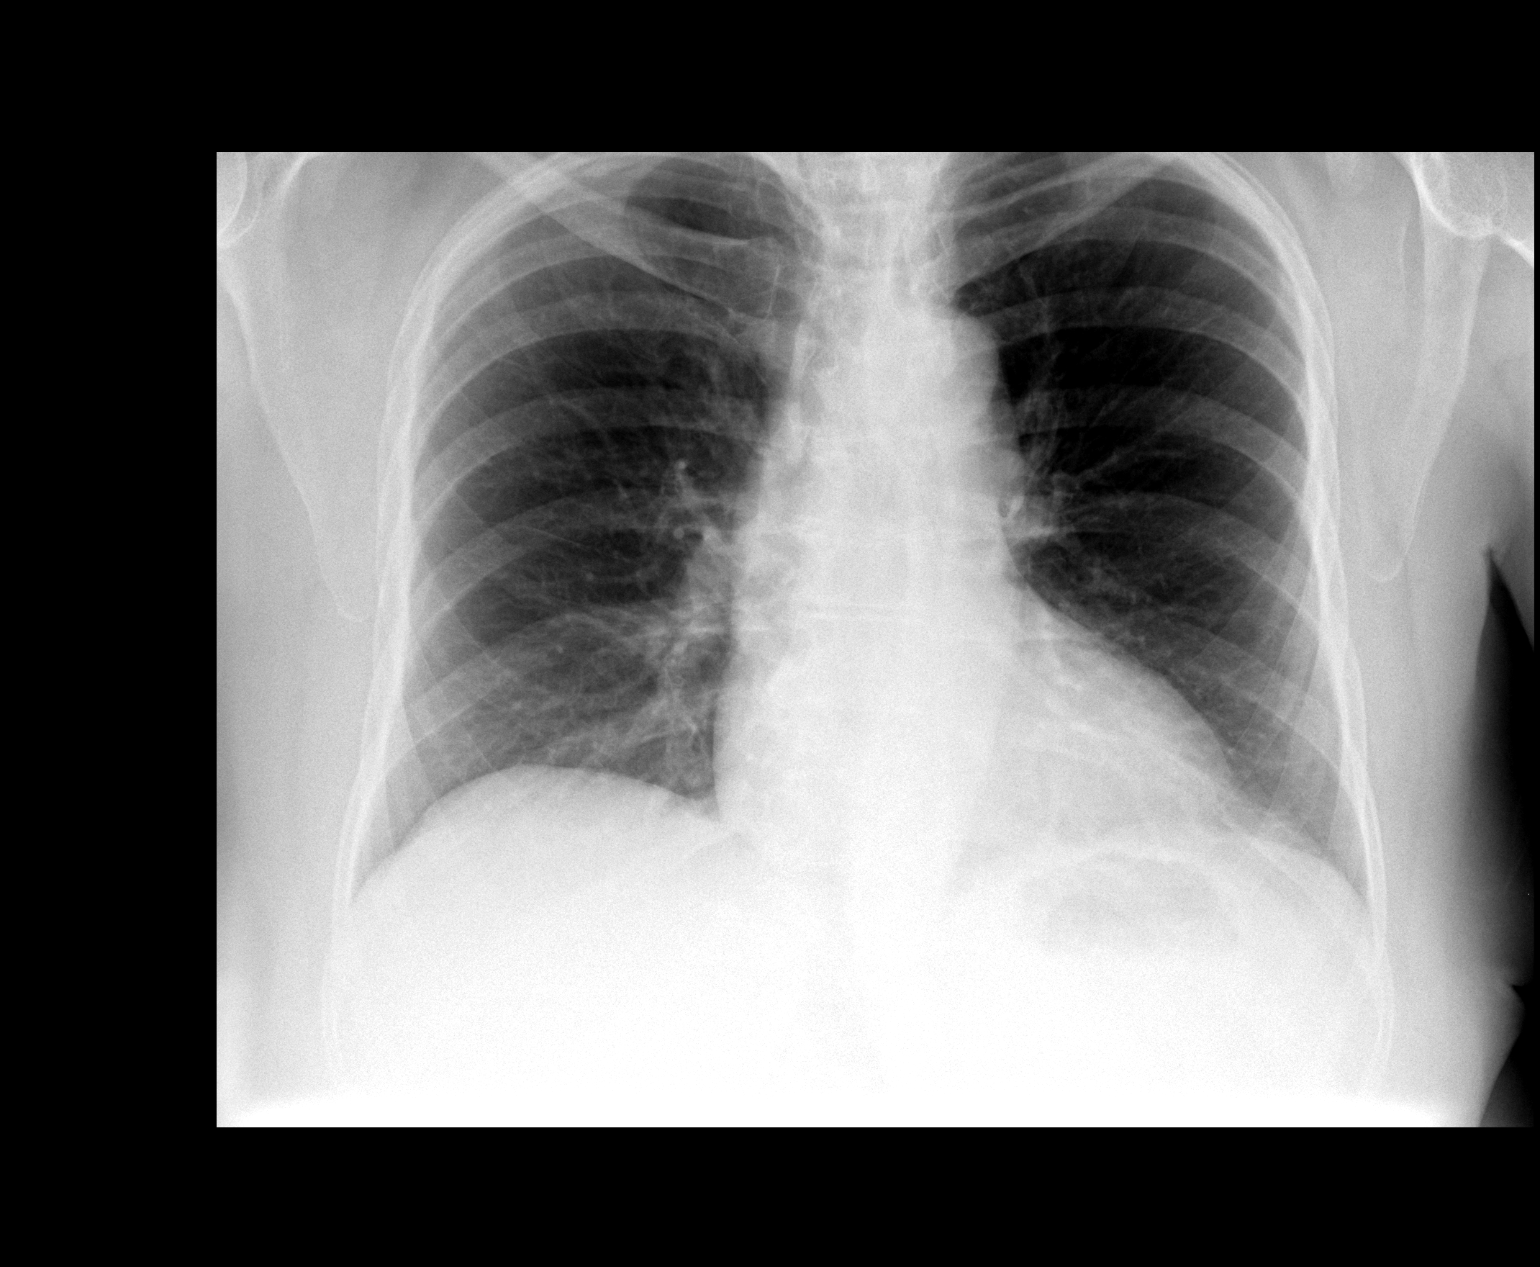

[view not recorded (2 of 2)]
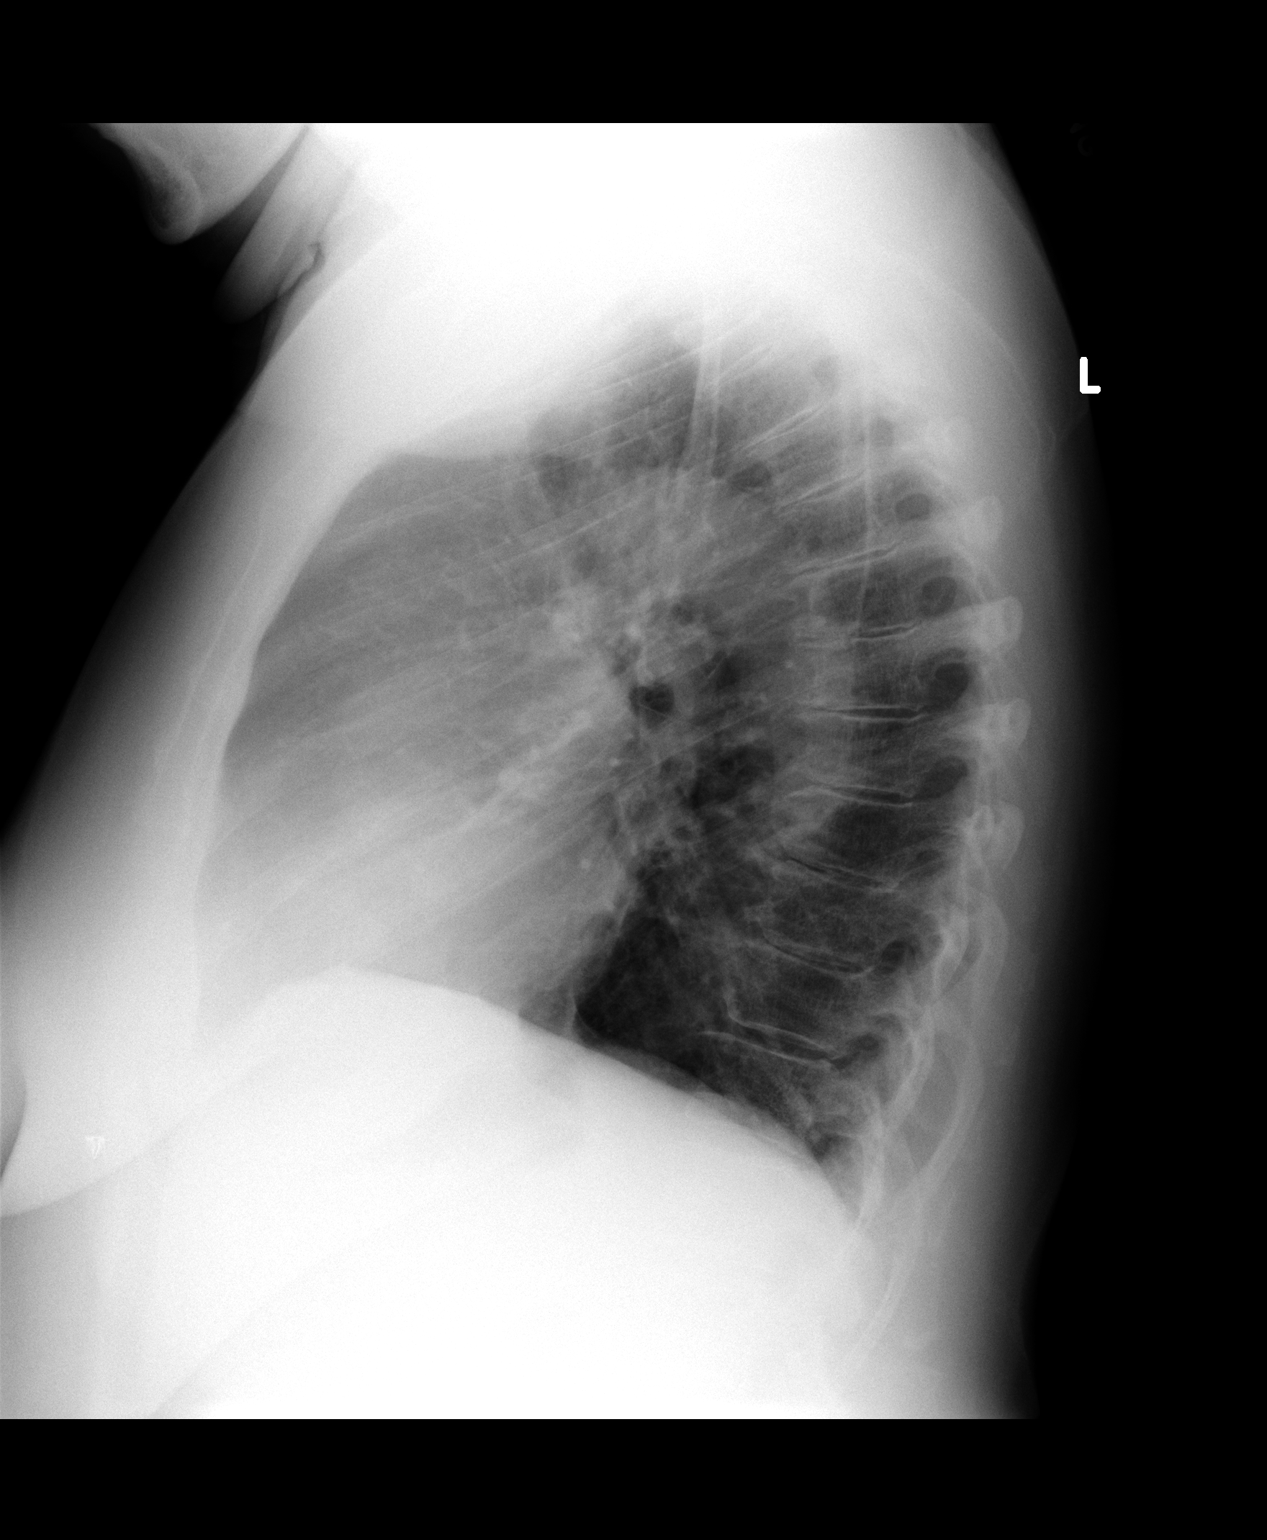

[2 of 2 positions shown; findings below may reference images not displayed]

FINDINGS: Lungs are mildly hyperexpanded. No edema or focal airspace
consolidation. No pneumothorax or plural effusion. Cardiopericardial
silhouette is at upper limits of normal for size. Imaged bony
structures of the thorax are intact.
IMPRESSION: Stable.  No acute cardiopulmonary process.

## 2016-01-15 ENCOUNTER — Other Ambulatory Visit (INDEPENDENT_AMBULATORY_CARE_PROVIDER_SITE_OTHER): Payer: Medicare Other

## 2016-01-15 ENCOUNTER — Ambulatory Visit: Payer: Medicare Other | Admitting: Internal Medicine

## 2016-01-15 ENCOUNTER — Encounter: Payer: Self-pay | Admitting: Internal Medicine

## 2016-01-15 ENCOUNTER — Ambulatory Visit (INDEPENDENT_AMBULATORY_CARE_PROVIDER_SITE_OTHER): Payer: Medicare Other | Admitting: Internal Medicine

## 2016-01-15 VITALS — BP 128/82 | HR 50 | Temp 98.6°F | Resp 12 | Ht 64.0 in | Wt 184.0 lb

## 2016-01-15 DIAGNOSIS — M4806 Spinal stenosis, lumbar region: Secondary | ICD-10-CM | POA: Diagnosis not present

## 2016-01-15 DIAGNOSIS — I10 Essential (primary) hypertension: Secondary | ICD-10-CM | POA: Diagnosis not present

## 2016-01-15 DIAGNOSIS — K5900 Constipation, unspecified: Secondary | ICD-10-CM | POA: Diagnosis not present

## 2016-01-15 DIAGNOSIS — E11319 Type 2 diabetes mellitus with unspecified diabetic retinopathy without macular edema: Secondary | ICD-10-CM

## 2016-01-15 DIAGNOSIS — M48062 Spinal stenosis, lumbar region with neurogenic claudication: Secondary | ICD-10-CM

## 2016-01-15 DIAGNOSIS — Z794 Long term (current) use of insulin: Secondary | ICD-10-CM | POA: Diagnosis not present

## 2016-01-15 DIAGNOSIS — E1165 Type 2 diabetes mellitus with hyperglycemia: Secondary | ICD-10-CM

## 2016-01-15 LAB — COMPREHENSIVE METABOLIC PANEL
ALT: 15 U/L (ref 0–35)
AST: 17 U/L (ref 0–37)
Albumin: 4.2 g/dL (ref 3.5–5.2)
Alkaline Phosphatase: 70 U/L (ref 39–117)
BILIRUBIN TOTAL: 0.6 mg/dL (ref 0.2–1.2)
BUN: 10 mg/dL (ref 6–23)
CALCIUM: 9.5 mg/dL (ref 8.4–10.5)
CHLORIDE: 102 meq/L (ref 96–112)
CO2: 32 meq/L (ref 19–32)
Creatinine, Ser: 1.01 mg/dL (ref 0.40–1.20)
GFR: 69.86 mL/min (ref >60.00)
Glucose, Bld: 120 mg/dL — ABNORMAL HIGH (ref 70–99)
Potassium: 3.3 mEq/L — ABNORMAL LOW (ref 3.5–5.1)
Sodium: 143 mEq/L (ref 135–145)
Total Protein: 7.1 g/dL (ref 6.0–8.3)

## 2016-01-15 LAB — HEMOGLOBIN A1C: HEMOGLOBIN A1C: 6.9 % — AB (ref 4.6–6.5)

## 2016-01-15 LAB — HEPATITIS C ANTIBODY: HCV AB: NEGATIVE

## 2016-01-15 NOTE — Assessment & Plan Note (Signed)
Better after the surgery. She has a brace that she can use if needed for long sitting or travel.

## 2016-01-15 NOTE — Progress Notes (Signed)
   Subjective:    [name], female    DOB: 09-Jan-1946, 70 y.o.   MRN: [id number]  HPI The patient is a 70 YO female coming in for follow up of her medical conditions including her diabetes (no low sugars, taking levemir and glipizide and januvia and valsartan, last HgA1c at goal, has listed optho complications), her blood pressure (controlled on amlodipine, valsartan/hctz, not complicated, no side effects, prior bradycardia with beta blockers) and her constipation (chronic, unchanged, some mild pain in the RLQ today, no bowel movement in 3-4 days, takes colace daily, no nausea or vomiting). No other new concerns.   Review of Systems  Constitutional: Negative for fever, activity change, appetite change, fatigue and unexpected weight change.  HENT: Positive for congestion, postnasal drip and rhinorrhea. Negative for sinus pressure, sore throat and voice change.   Eyes: Negative.   Respiratory: Positive for shortness of breath. Negative for cough, chest tightness and wheezing.   Cardiovascular: Positive for leg swelling. Negative for chest pain and palpitations.  Gastrointestinal: Positive for abdominal pain and constipation. Negative for nausea, vomiting, diarrhea and abdominal distention.  Musculoskeletal: Positive for arthralgias. Negative for myalgias, back pain and gait problem.  Skin: Negative.   Neurological: Negative for dizziness, weakness, light-headedness and headaches.  Psychiatric/Behavioral: Negative.       Objective:   Physical Exam  Constitutional: She is oriented to person, place, and time.  HENT:  Head: Normocephalic and atraumatic.  Oropharynx red and clear drainage, nasal turbinates without crusting and no sinus tenderness.   Eyes: EOM are normal.  Neck: Normal range of motion.  Cardiovascular: Normal rate and regular rhythm.   Pulmonary/Chest: Effort normal and breath sounds normal. No respiratory distress. She has no wheezes. She has no rales.    Abdominal: Soft. Bowel sounds are normal. She exhibits no distension. There is no tenderness. There is no rebound.  Musculoskeletal: She exhibits no edema.  Lymphadenopathy:    She has no cervical adenopathy.  Neurological: She is alert and oriented to person, place, and time. Coordination normal.  Skin: Skin is warm and dry.  Psychiatric: She has a normal mood and affect.   Filed Vitals:   01/15/16 1002  BP: 128/82  Pulse: 50  Temp: 98.6 F (37 C)  TempSrc: Oral  Resp: 12  Height: 5\' 4"  (1.626 m)  Weight: 184 lb (83.462 kg)  SpO2: 97%      Assessment & Plan:

## 2016-01-15 NOTE — Assessment & Plan Note (Addendum)
Last HgA1c at goal, but generally not well controlled. She denies low sugars and is taking glipizide and levemir and januvia. On ARB. No kidney problems. Checking HgA1c today. Adjust as needed. Supposedly retinopathy and she sees her eye doctor regularly.

## 2016-01-15 NOTE — Progress Notes (Signed)
Pre visit review using our clinic review tool, if applicable. No additional management support is needed unless otherwise documented below in the visit note. 

## 2016-01-15 NOTE — Assessment & Plan Note (Signed)
She is taking colace daily but not moving her bowels well. She is having some mild discomfort and BS good on exam and no rebound or guarding. Not obstructed. Advised to start taking senokot-d daily and then BID if no improvement.

## 2016-01-15 NOTE — Assessment & Plan Note (Signed)
BP at goal on amlodipine and valsartan/hctz. Not known to be complicated and checking CMP and adjust as needed.

## 2016-01-15 NOTE — Patient Instructions (Signed)
We will check the blood work today and call you back with the results.   For the constipation another thing to try would be to replace the colace with senokot-d (this is a pill with two medicines in it one of which is colace and the other helps to move the guts better). You can take it 1 pill daily or 1 pill twice a day if needed.   Diabetes and Standards of Medical Care Diabetes is complicated. You may find that your diabetes team includes a dietitian, nurse, diabetes educator, eye doctor, and more. To help everyone know what is going on and to help you get the care you deserve, the following schedule of care was developed to help keep you on track. Below are the tests, exams, vaccines, medicines, education, and plans you will need. HbA1c test This test shows how well you have controlled your glucose over the past 2-3 months. It is used to see if your diabetes management plan needs to be adjusted.   It is performed at least 2 times a year if you are meeting treatment goals.  It is performed 4 times a year if therapy has changed or if you are not meeting treatment goals. Blood pressure test  This test is performed at every routine medical visit. The goal is less than 140/90 mm Hg for most people, but 130/80 mm Hg in some cases. Ask your health care provider about your goal. Dental exam  Follow up with the dentist regularly. Eye exam  If you are diagnosed with type 1 diabetes as a child, get an exam upon reaching the age of 20 years or older and having had diabetes for 3-5 years. Yearly eye exams are recommended after that initial eye exam.  If you are diagnosed with type 1 diabetes as an adult, get an exam within 5 years of diagnosis and then yearly.  If you are diagnosed with type 2 diabetes, get an exam as soon as possible after the diagnosis and then yearly. Foot care exam  Visual foot exams are performed at every routine medical visit. The exams check for cuts, injuries, or other  problems with the feet.  You should have a complete foot exam performed every year. This exam includes an inspection of the structure and skin of your feet, a check of the pulses in your feet, and a check of the sensation in your feet.  Type 1 diabetes: The first exam is performed 5 years after diagnosis.  Type 2 diabetes: The first exam is performed at the time of diagnosis.  Check your feet nightly for cuts, injuries, or other problems with your feet. Tell your health care provider if anything is not healing. Kidney function test (urine microalbumin)  This test is performed once a year.  Type 1 diabetes: The first test is performed 5 years after diagnosis.  Type 2 diabetes: The first test is performed at the time of diagnosis.  A serum creatinine and estimated glomerular filtration rate (eGFR) test is done once a year to assess the level of chronic kidney disease (CKD), if present. Lipid profile (cholesterol, HDL, LDL, triglycerides)  Performed every 5 years for most people.  The goal for LDL is less than 100 mg/dL. If you are at high risk, the goal is less than 70 mg/dL.  The goal for HDL is 40 mg/dL-50 mg/dL for men and 50 mg/dL-60 mg/dL for women. An HDL cholesterol of 60 mg/dL or higher gives some protection against heart disease.  The  goal for triglycerides is less than 150 mg/dL. Immunizations  The flu (influenza) vaccine is recommended yearly for every person 36 months of age or older who has diabetes.  The pneumonia (pneumococcal) vaccine is recommended for every person 46 years of age or older who has diabetes. Adults 51 years of age or older may receive the pneumonia vaccine as a series of two separate shots.  The hepatitis B vaccine is recommended for adults shortly after they have been diagnosed with diabetes.  The Tdap (tetanus, diphtheria, and pertussis) vaccine should be given:  According to normal childhood vaccination schedules, for children.  Every 10 years,  for adults who have diabetes. Diabetes self-management education  Education is recommended at diagnosis and ongoing as needed. Treatment plan  Your treatment plan is reviewed at every medical visit.   This information is not intended to replace advice given to you by your health care provider. Make sure you discuss any questions you have with your health care provider.   Document Released: 08/03/2009 Document Revised: 10/27/2014 Document Reviewed: 03/08/2013 Elsevier Interactive Patient Education [employer].

## 2016-01-23 ENCOUNTER — Other Ambulatory Visit: Payer: Self-pay | Admitting: *Deleted

## 2016-01-23 MED ORDER — AMLODIPINE BESYLATE 5 MG PO TABS: 5.0000 mg | ORAL_TABLET | Freq: Every day | ORAL | Status: DC

## 2016-01-23 MED ORDER — GLIPIZIDE ER 2.5 MG PO TB24: ORAL_TABLET | ORAL | Status: DC

## 2016-01-23 MED ORDER — VALSARTAN-HYDROCHLOROTHIAZIDE 160-25 MG PO TABS: 1.0000 | ORAL_TABLET | Freq: Every day | ORAL | Status: DC

## 2016-01-23 MED ORDER — SITAGLIPTIN PHOSPHATE 100 MG PO TABS: 100.0000 mg | ORAL_TABLET | Freq: Every day | ORAL | Status: DC

## 2016-01-23 NOTE — Telephone Encounter (Signed)
Received call from pt daughter she states mom is running out of all her meds need rx's sent to [place]. Verified which medicine she is needing inform will send to [place]...Johny Chess

## 2016-02-19 ENCOUNTER — Ambulatory Visit (INDEPENDENT_AMBULATORY_CARE_PROVIDER_SITE_OTHER): Payer: Medicare Other | Admitting: Internal Medicine

## 2016-02-19 ENCOUNTER — Encounter: Payer: Self-pay | Admitting: Internal Medicine

## 2016-02-19 VITALS — BP 136/80 | HR 56 | Temp 98.3°F | Resp 16 | Wt 178.0 lb

## 2016-02-19 DIAGNOSIS — J3089 Other allergic rhinitis: Secondary | ICD-10-CM | POA: Diagnosis not present

## 2016-02-19 DIAGNOSIS — J45901 Unspecified asthma with (acute) exacerbation: Secondary | ICD-10-CM | POA: Insufficient documentation

## 2016-02-19 DIAGNOSIS — J4521 Mild intermittent asthma with (acute) exacerbation: Secondary | ICD-10-CM

## 2016-02-19 MED ORDER — FLUTICASONE PROPIONATE 50 MCG/ACT NA SUSP: 2.0000 | Freq: Every day | NASAL | Status: DC

## 2016-02-19 MED ORDER — MONTELUKAST SODIUM 10 MG PO TABS: 10.0000 mg | ORAL_TABLET | Freq: Every day | ORAL | Status: DC

## 2016-02-19 NOTE — Assessment & Plan Note (Addendum)
Mild flare of asthma related to seasonal allergies-no wheezing on exam  She has history maintenance inhalers-unsure why, but understands she should only use one at a time Albuterol as needed Stressed the importance of controlling her allergy is better to help control her asthma Singulair added Trial of Flonase Continue Allegra Continue current inhalers

## 2016-02-19 NOTE — Patient Instructions (Addendum)
Continue your inhalers.    Medications reviewed and updated.  Changes include starting a nasal spray, flonase, and singulair for your allergies.  Your prescription(s) have been submitted to your pharmacy. Please take as directed and contact our office if you believe you are having problem(s) with the medication(s).    Please followup if your symptoms do not improve
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
## 2016-02-19 NOTE — Assessment & Plan Note (Signed)
Taking Allegra daily, but this is not controlling her symptoms and causing a flare of her asthma Continue Allegra Start Singulair nightly Trial of Flonase nasal spray Call or follow up if no improvement

## 2016-02-19 NOTE — Progress Notes (Signed)
Pre visit review using our clinic review tool, if applicable. No additional management support is needed unless otherwise documented below in the visit note. 

## 2016-03-26 ENCOUNTER — Ambulatory Visit (INDEPENDENT_AMBULATORY_CARE_PROVIDER_SITE_OTHER): Payer: Medicare Other | Admitting: Gynecology

## 2016-03-26 ENCOUNTER — Encounter: Payer: Self-pay | Admitting: Gynecology

## 2016-03-26 VITALS — BP 122/76 | Ht 63.0 in | Wt 182.0 lb

## 2016-03-26 DIAGNOSIS — Z01419 Encounter for gynecological examination (general) (routine) without abnormal findings: Secondary | ICD-10-CM

## 2016-03-26 DIAGNOSIS — N952 Postmenopausal atrophic vaginitis: Secondary | ICD-10-CM

## 2016-03-26 NOTE — Progress Notes (Signed)
    Pamela Morgan Sep 04, 1946 [id number]        70 y.o.  G2P2  for breast and pelvic exam. Doing well without complaints  Past medical history,surgical history, problem list, medications, allergies, family history and social history were all reviewed and documented as reviewed in the EPIC chart.  ROS:  Performed with pertinent positives and negatives included in the history, assessment and plan.   Additional significant findings :  none   Exam: Pamela Morgan assistant Filed Vitals:   03/26/16 0839  BP: 122/76  Height: 5\' 3"  (1.6 m)  Weight: 182 lb (82.555 kg)   General appearance:  Normal affect, orientation and appearance. Skin: Grossly normal HEENT: Without gross lesions.  No cervical or supraclavicular adenopathy. Thyroid normal.  Lungs:  Clear without wheezing, rales or rhonchi Cardiac: RR, without RMG Abdominal:  Soft, nontender, without masses, guarding, rebound, organomegaly or hernia Breasts:  Examined lying and sitting without masses, retractions, discharge or axillary adenopathy. Pelvic:  Ext/BUS/vagina with atrophic changes  Adnexa without masses or tenderness    Anus and perineum normal   Rectovaginal normal sphincter tone without palpated masses or tenderness.    Assessment/Plan:  70 y.o. G2P2 female for breast and pelvic exam.   1. Postmenopausal/atrophic genital changes. Without significant hot flushes, night sweats or vaginal dryness. Continue to monitor and report any issues. 2. Mammography coming due this July I reminded her to schedule this. SBE monthly reviewed. 3. DEXA 2015 normal. Plan repeat at 5 year interval. Increased calcium and vitamin D. 4. Pap smear 2012. No Pap smear done today. Status post hysterectomy for benign indications, over the age 42, no history of abnormal Pap smears. We both agree to stop screening per current screening guidelines. 5. Colonoscopy 2009. Planned repeat interval reported 10 years. 6. Health maintenance. No routine lab work  done as this is done at her primary physician's office. Follow up 1 year, sooner as needed   Anastasio Auerbach MD, 9:06 AM 03/26/2016

## 2016-03-26 NOTE — Patient Instructions (Signed)

## 2016-05-01 ENCOUNTER — Encounter: Payer: Self-pay | Admitting: Family

## 2016-05-01 ENCOUNTER — Ambulatory Visit (INDEPENDENT_AMBULATORY_CARE_PROVIDER_SITE_OTHER): Payer: Medicare Other | Admitting: Family

## 2016-05-01 DIAGNOSIS — J4530 Mild persistent asthma, uncomplicated: Secondary | ICD-10-CM | POA: Diagnosis not present

## 2016-05-01 MED ORDER — IPRATROPIUM-ALBUTEROL 0.5-2.5 (3) MG/3ML IN SOLN: 3.0000 mL | RESPIRATORY_TRACT | Status: DC | PRN

## 2016-05-01 MED ORDER — MONTELUKAST SODIUM 10 MG PO TABS: 10.0000 mg | ORAL_TABLET | Freq: Every day | ORAL | Status: DC

## 2016-05-01 MED ORDER — BUDESONIDE-FORMOTEROL FUMARATE 80-4.5 MCG/ACT IN AERO: 2.0000 | INHALATION_SPRAY | Freq: Two times a day (BID) | RESPIRATORY_TRACT | Status: DC

## 2016-05-01 MED ORDER — ALBUTEROL SULFATE HFA 108 (90 BASE) MCG/ACT IN AERS: 1.0000 | INHALATION_SPRAY | Freq: Four times a day (QID) | RESPIRATORY_TRACT | Status: DC | PRN

## 2016-05-01 NOTE — Progress Notes (Signed)
Pre visit review using our clinic review tool, if applicable. No additional management support is needed unless otherwise documented below in the visit note. 
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
## 2016-05-01 NOTE — Assessment & Plan Note (Addendum)
Afebrile and no acute respiratory distress. Not taking Symbicort correctly. Discussed maintenance and rescue inhalers. Refilled all. No evidence of co-existing bacterial infection. Return precautions given.

## 2016-05-01 NOTE — Patient Instructions (Signed)
If asthma doesn't improve once we take your  Medications correctly, please make follow up to ensure asthma is well controlled.   If there is no improvement in your symptoms, or if there is any worsening of symptoms, or if you have any additional concerns, please return for re-evaluation; or, if we are closed, consider going to the Emergency Room for evaluation if symptoms urgent.  Asthma Attack Prevention While you may not be able to control the fact that you have asthma, you can take actions to prevent asthma attacks. The best way to prevent asthma attacks is to maintain good control of your asthma. You can achieve this by:  Taking your medicines as directed.  Avoiding things that can irritate your airways or make your asthma symptoms worse (asthma triggers).  Keeping track of how well your asthma is controlled and of any changes in your symptoms.  Responding quickly to worsening asthma symptoms (asthma attack).  Seeking emergency care when it is needed. WHAT ARE SOME WAYS TO PREVENT AN ASTHMA ATTACK? Have a Plan Work with your health care provider to create a written plan for managing and treating your asthma attacks (asthma action plan). This plan includes:  A list of your asthma triggers and how you can avoid them.  Information on when medicines should be taken and when their dosages should be changed.  The use of a device that measures how well your lungs are working (peak flow meter). Monitor Your Asthma Use your peak flow meter and record your results in a journal every day. A drop in your peak flow numbers on one or more days may indicate the start of an asthma attack. This can happen even before you start to feel symptoms. You can prevent an asthma attack from getting worse by following the steps in your asthma action plan. Avoid Asthma Triggers Work with your asthma health care provider to find out what your asthma triggers are. This can be done by:  Allergy  testing.  Keeping a journal that notes when asthma attacks occur and the factors that may have contributed to them.  Determining if there are other medical conditions that are making your asthma worse. Once you have determined your asthma triggers, take steps to avoid them. This may include avoiding excessive or prolonged exposure to:  Dust. Have someone dust and vacuum your home for you once or twice a week. Using a high-efficiency particulate arrestance (HEPA) vacuum is best.  Smoke. This includes campfire smoke, forest fire smoke, and secondhand smoke from tobacco products.  Pet dander. Avoid contact with animals that you know you are allergic to.  Allergens from trees, grasses or pollens. Avoid spending a lot of time outdoors when pollen counts are high, and on very windy days.  Very cold, dry, or humid air.  Mold.  Foods that contain high amounts of sulfites.  Strong odors.  Outdoor air pollutants, such as Lexic[employer].  Indoor air pollutants, such as aerosol sprays and fumes from household cleaners.  Household pests, including dust mites and cockroaches, and pest droppings.  Certain medicines, including NSAIDs. Always talk to your health care provider before stopping or starting any new medicines. Medicines Take over-the-counter and prescription medicines only as told by your health care provider. Many asthma attacks can be prevented by carefully following your medicine schedule. Taking your medicines correctly is especially important when you cannot avoid certain asthma triggers. Act Quickly If an asthma attack does happen, acting quickly can decrease how severe it is  and how long it lasts. Take these steps:   Pay attention to your symptoms. If you are coughing, wheezing, or having difficulty breathing, do not wait to see if your symptoms go away on their own. Follow your asthma action plan.  If you have followed your asthma action plan and your symptoms are not  improving, call your health care provider or seek immediate medical care at the nearest hospital. It is important to note how often you need to use your fast-acting rescue inhaler. If you are using your rescue inhaler more often, it may mean that your asthma is not under control. Adjusting your asthma treatment plan may help you to prevent future asthma attacks and help you to gain better control of your condition. HOW CAN I PREVENT AN ASTHMA ATTACK WHEN I EXERCISE? Follow advice from your health care provider about whether you should use your fast-acting inhaler before exercising. Many people with asthma experience exercise-induced bronchoconstriction (EIB). This condition often worsens during vigorous exercise in cold, humid, or dry environments. Usually, people with EIB can stay very active by pre-treating with a fast-acting inhaler before exercising.   This information is not intended to replace advice given to you by your health care provider. Make sure you discuss any questions you have with your health care provider.   Document Released: 09/24/2009 Document Revised: 06/27/2015 Document Reviewed: 03/08/2015 Elsevier Interactive Patient Education [employer].

## 2016-07-01 ENCOUNTER — Ambulatory Visit (INDEPENDENT_AMBULATORY_CARE_PROVIDER_SITE_OTHER): Payer: Medicare Other | Admitting: Internal Medicine

## 2016-07-01 ENCOUNTER — Encounter: Payer: Self-pay | Admitting: Internal Medicine

## 2016-07-01 ENCOUNTER — Telehealth: Payer: Self-pay | Admitting: Internal Medicine

## 2016-07-01 DIAGNOSIS — Z23 Encounter for immunization: Secondary | ICD-10-CM | POA: Diagnosis not present

## 2016-07-01 DIAGNOSIS — J4530 Mild persistent asthma, uncomplicated: Secondary | ICD-10-CM | POA: Diagnosis not present

## 2016-07-01 DIAGNOSIS — M25559 Pain in unspecified hip: Secondary | ICD-10-CM | POA: Insufficient documentation

## 2016-07-01 MED ORDER — ALBUTEROL SULFATE HFA 108 (90 BASE) MCG/ACT IN AERS: 1.0000 | INHALATION_SPRAY | Freq: Four times a day (QID) | RESPIRATORY_TRACT | 0 refills | Status: DC | PRN

## 2016-07-01 MED ORDER — DICLOFENAC SODIUM 1 % TD GEL: 2.0000 g | Freq: Three times a day (TID) | TRANSDERMAL | 6 refills | Status: DC | PRN

## 2016-07-01 MED ORDER — BUDESONIDE-FORMOTEROL FUMARATE 80-4.5 MCG/ACT IN AERO: 2.0000 | INHALATION_SPRAY | Freq: Two times a day (BID) | RESPIRATORY_TRACT | 1 refills | Status: DC

## 2016-07-01 MED ORDER — BUDESONIDE-FORMOTEROL FUMARATE 80-4.5 MCG/ACT IN AERO
2.0000 | INHALATION_SPRAY | Freq: Two times a day (BID) | RESPIRATORY_TRACT | 1 refills | Status: DC
Start: 2016-07-01 — End: 2017-11-03

## 2016-07-01 MED ORDER — ALBUTEROL SULFATE HFA 108 (90 BASE) MCG/ACT IN AERS: 1.0000 | INHALATION_SPRAY | Freq: Four times a day (QID) | RESPIRATORY_TRACT | 1 refills | Status: DC | PRN

## 2016-07-01 NOTE — Telephone Encounter (Signed)
Patient aware.

## 2016-07-01 NOTE — Progress Notes (Signed)
   Subjective:    [name], female    DOB: 10/17/1946, 70 y.o.   MRN: [id number]  HPI The patient is a 70 YO female coming in for SOB. Going on for 1 week. She is out of all her inhalers. Then she clarifies and contradicts herself and states that she just has one inhaler and gets it from her purse. It is symbicort and has 110 puffs left. She cannot recall when she picked it up but admits to using it prn only. She states she used twice yesterday. She is taking allegra but not taking her singulair. She is taking flonase but is not sure if she takes that daily or prn. She does not know if she has an albuterol inhaler or a rescue inhaler but does not think she does. She also has an old advair inhaler with 18 puffs remaining which she does not use anymore as they took her off. She is also having hip pain for about 1-2 weeks. Hurts when she lies on her side. She denies injury and has not taken anything for it. It is hard for her to get up due to the pain.   Review of Systems  Constitutional: Negative for activity change, appetite change, fatigue, fever and unexpected weight change.  HENT: Positive for congestion, postnasal drip and rhinorrhea. Negative for sinus pressure, sore throat and voice change.   Eyes: Negative.   Respiratory: Positive for cough. Negative for chest tightness, shortness of breath and wheezing.   Cardiovascular: Negative for chest pain, palpitations and leg swelling.  Gastrointestinal: Positive for constipation. Negative for abdominal distention, abdominal pain, diarrhea, nausea and vomiting.  Musculoskeletal: Positive for arthralgias. Negative for back pain, gait problem and myalgias.  Skin: Negative.   Neurological: Negative for dizziness, weakness, light-headedness and headaches.  Psychiatric/Behavioral: Negative.       Objective:   Physical Exam  Constitutional: She is oriented to person, place, and time.  HENT:  Head: Normocephalic and atraumatic.    Oropharynx red and clear drainage, nasal turbinates without crusting and no sinus tenderness.   Eyes: EOM are normal.  Neck: Normal range of motion.  Cardiovascular: Normal rate and regular rhythm.   Pulmonary/Chest: Effort normal and breath sounds normal. No respiratory distress. She has no wheezes. She has no rales.  Abdominal: Soft. Bowel sounds are normal. She exhibits no distension. There is no tenderness. There is no rebound.  Musculoskeletal: She exhibits no edema.  Lymphadenopathy:    She has no cervical adenopathy.  Neurological: She is alert and oriented to person, place, and time. Coordination normal.  Skin: Skin is warm and dry.  Psychiatric:  Some confusion during the exam with her medications and her symptoms. She is agreeable to most questions and trying to avoid leading questions.    Vitals:   07/01/16 0813 07/01/16 0835  BP: (!) 160/86 122/82  Pulse: (!) 49   Resp: 14   Temp: 98.3 F (36.8 C)   TempSrc: Oral   SpO2: 97%   Weight: 181 lb (82.1 kg)   Height: 5\' 3"  (1.6 m)       Assessment & Plan:  High dose flu shot given at visit.

## 2016-07-01 NOTE — Telephone Encounter (Signed)
These were already sent to [facility] and she can choose to fill only a small amount of the medication by asking the pharmacist. We will send in the larger supply to [facility].

## 2016-07-01 NOTE — Assessment & Plan Note (Signed)
She is not taking her medications as instructed. She was educated and given written instructions to use 2 puffs symbicort BID, singulair daily, allegra BID, flonase daily and albuterol prn. Refills given for her medications today since she does not think she has any of the them. She states someone got rid of her inhalers. I suspect she is not using correctly at home and if this is not effective she will need switch to an inhaler which is once daily.

## 2016-07-01 NOTE — Telephone Encounter (Signed)
Daughter called in stating that medications given today are too expensive together.  States patient is requesting small supplies of albuterol, symbicort and voltaren to be sent to [facility].  Then requesting larger script to be sent to [facility] since this would be less expensive.

## 2016-07-01 NOTE — Patient Instructions (Signed)
For the breathing:  Every day take symbicort 2 puffs in the morning and 2 puffs in the afternoon.   When needed use albuterol 1-2 puffs every 4 hours for breathing problems.   Every day take montelukast (singulair) 1 pill daily.  Every day take flonase (nose spray) 2 sprays in each nostril.   Every day take allegra 1 pill twice a day.   I have sent in a new cream for the hip called voltaren which you can rub where it huts up to 3 times per day.

## 2016-07-01 NOTE — Progress Notes (Signed)
Pre visit review using our clinic review tool, if applicable. No additional management support is needed unless otherwise documented below in the visit note. 

## 2016-07-01 NOTE — Assessment & Plan Note (Signed)
Rx for voltaren gel for likely trochanteric bursitis.

## 2016-07-17 ENCOUNTER — Encounter: Payer: Self-pay | Admitting: Internal Medicine

## 2016-07-17 ENCOUNTER — Other Ambulatory Visit (INDEPENDENT_AMBULATORY_CARE_PROVIDER_SITE_OTHER): Payer: Medicare Other

## 2016-07-17 ENCOUNTER — Ambulatory Visit (INDEPENDENT_AMBULATORY_CARE_PROVIDER_SITE_OTHER): Payer: Medicare Other | Admitting: Internal Medicine

## 2016-07-17 VITALS — BP 154/74 | HR 49 | Temp 98.0°F | Resp 14 | Ht 63.0 in | Wt 181.0 lb

## 2016-07-17 DIAGNOSIS — E1165 Type 2 diabetes mellitus with hyperglycemia: Secondary | ICD-10-CM | POA: Diagnosis not present

## 2016-07-17 DIAGNOSIS — E785 Hyperlipidemia, unspecified: Secondary | ICD-10-CM

## 2016-07-17 DIAGNOSIS — J4530 Mild persistent asthma, uncomplicated: Secondary | ICD-10-CM | POA: Diagnosis not present

## 2016-07-17 DIAGNOSIS — E11319 Type 2 diabetes mellitus with unspecified diabetic retinopathy without macular edema: Secondary | ICD-10-CM

## 2016-07-17 DIAGNOSIS — I10 Essential (primary) hypertension: Secondary | ICD-10-CM | POA: Diagnosis not present

## 2016-07-17 LAB — CBC
HCT: 38.8 % (ref 36.0–46.0)
Hemoglobin: 12.9 g/dL (ref 12.0–15.0)
MCHC: 33.3 g/dL (ref 30.0–36.0)
MCV: 82.8 fl (ref 78.0–100.0)
PLATELETS: 253 10*3/uL (ref 150.0–400.0)
RBC: 4.68 Mil/uL (ref 3.87–5.11)
RDW: 14.1 % (ref 11.5–15.5)
WBC: 9.5 10*3/uL (ref 4.0–10.5)

## 2016-07-17 LAB — COMPREHENSIVE METABOLIC PANEL
ALBUMIN: 4 g/dL (ref 3.5–5.2)
ALK PHOS: 86 U/L (ref 39–117)
ALT: 15 U/L (ref 0–35)
AST: 17 U/L (ref 0–37)
BILIRUBIN TOTAL: 0.5 mg/dL (ref 0.2–1.2)
BUN: 10 mg/dL (ref 6–23)
CALCIUM: 8.7 mg/dL (ref 8.4–10.5)
CO2: 31 mEq/L (ref 19–32)
CREATININE: 0.94 mg/dL (ref 0.40–1.20)
Chloride: 102 mEq/L (ref 96–112)
GFR: 75.78 mL/min (ref >60.00)
Glucose, Bld: 97 mg/dL (ref 70–99)
Potassium: 3.2 mEq/L — ABNORMAL LOW (ref 3.5–5.1)
Sodium: 142 mEq/L (ref 135–145)
TOTAL PROTEIN: 7.1 g/dL (ref 6.0–8.3)

## 2016-07-17 LAB — LIPID PANEL
CHOL/HDL RATIO: 4
CHOLESTEROL: 196 mg/dL (ref 0–200)
HDL: 52.2 mg/dL (ref >39.00)
LDL CALC: 124 mg/dL — AB (ref 0–99)
NonHDL: 143.42
TRIGLYCERIDES: 99 mg/dL (ref 0.0–149.0)
VLDL: 19.8 mg/dL (ref 0.0–40.0)

## 2016-07-17 LAB — HEMOGLOBIN A1C: Hgb A1c MFr Bld: 6.5 % (ref 4.6–6.5)

## 2016-07-17 NOTE — Assessment & Plan Note (Signed)
Foot exam done, checking HgA1c, she is not taking meds as listed and likely this is fine. She admits to not taking it as she is supposed to for a long time and previous HgA1c at goal. She is still taking glipizide and januvia and on ARB.

## 2016-07-17 NOTE — Assessment & Plan Note (Signed)
BP typically at goal on her valsartan/hctz. Monitor and adjust as needed. Checking CMP today.

## 2016-07-17 NOTE — Assessment & Plan Note (Signed)
She is using only albuterol now and states that her other is out (previously I verified about 2 weeks ago that it had 110 puffs left on it (should be using 4 per day) and she should not have run out yet. It is more likely that she misplaced it at home. Her memory is poor. I reminded her again why she needs to take symbicort daily every day for control and albuterol only as needed. She was more clear on this when leaving and given that plan written out again as well.

## 2016-07-17 NOTE — Progress Notes (Signed)
   Subjective:    [name], female    DOB: 08/27/1946, 70 y.o.   MRN: [id number]  HPI The patient is a 70 YO female coming in for follow up of her breathing (she is not using the symbicort as advised since last visit, only using albuterol and still getting SOB from time to time, some coughing and extreme sensitivity to perfume and scents), and her diabetes (she does not have sugar log, she denies taking her levemir except every month or two due to her sugars being fine, she checks in the morning and sometimes in the evening, some in the 100-150 range and occasionally high, no new numbness in her feet or pain, taking glipizide and januvia and valsartan), as well as her blood pressure (she did not take meds this morning due to early arrival and was not able to eat breakfast, normally does take her valsartan/hctz and amlodipine, denies headaches or chest pains).   Review of Systems  Constitutional: Negative for activity change, appetite change, fatigue, fever and unexpected weight change.  HENT: Positive for congestion and postnasal drip. Negative for rhinorrhea, sinus pressure, sore throat and voice change.   Eyes: Negative.   Respiratory: Positive for cough and shortness of breath. Negative for chest tightness and wheezing.   Cardiovascular: Negative for chest pain, palpitations and leg swelling.  Gastrointestinal: Negative for abdominal distention, abdominal pain, diarrhea, nausea and vomiting.  Musculoskeletal: Positive for arthralgias. Negative for back pain, gait problem and myalgias.  Skin: Negative.   Neurological: Negative for dizziness, weakness, light-headedness and headaches.  Psychiatric/Behavioral: Negative.       Objective:   Physical Exam  Constitutional: She is oriented to person, place, and time.  HENT:  Head: Normocephalic and atraumatic.  Oropharynx red and clear drainage, nasal turbinates without crusting and no sinus tenderness.   Eyes: EOM are normal.    Neck: Normal range of motion.  Cardiovascular: Normal rate and regular rhythm.   Pulmonary/Chest: Effort normal and breath sounds normal. No respiratory distress. She has no wheezes. She has no rales.  Stable lung exam from prior  Abdominal: Soft. Bowel sounds are normal. She exhibits no distension. There is no tenderness. There is no rebound.  Musculoskeletal: She exhibits no edema.  Lymphadenopathy:    She has no cervical adenopathy.  Neurological: She is alert and oriented to person, place, and time. Coordination normal.  Skin: Skin is warm and dry.  Psychiatric:  Some confusion during the exam with her medications and her symptoms.    Vitals:   07/17/16 0805  BP: (!) 160/60  Pulse: (!) 49  Resp: 14  Temp: 98 F (36.7 C)  TempSrc: Oral  SpO2: 98%  Weight: 181 lb (82.1 kg)  Height: 5\' 3"  (1.6 m)      Assessment & Plan:

## 2016-07-17 NOTE — Progress Notes (Signed)
Pre visit review using our clinic review tool, if applicable. No additional management support is needed unless otherwise documented below in the visit note. 

## 2016-07-17 NOTE — Patient Instructions (Addendum)
When you get home look for the square inhaler that is called symbicort. This is an inhaler which you use twice a day EVERY day to help the lungs. This inhaler has steroid medicine in it to get in the lungs every day to keep you from wheezing and keep you from getting short of breath.   If you get short of breath it is still okay to use the albuterol (the one you had today with you).   We are checking the labs and will call you back with the results.   It is okay to take tylenol for the hip pain and if that does not help we may need to have you see a hip specialist.

## 2016-07-17 NOTE — Assessment & Plan Note (Signed)
Checking lipid panel and adjust as needed.  

## 2016-08-25 IMAGING — DX DG SPINE 1V PORT
1 series · 1 of 1 positions shown · non-contrast
Comparison: Prior film same day

CLINICAL DATA: Intraoperative localization

EXAM:
PORTABLE SPINE - 1 VIEW

[ls-spine x-table]
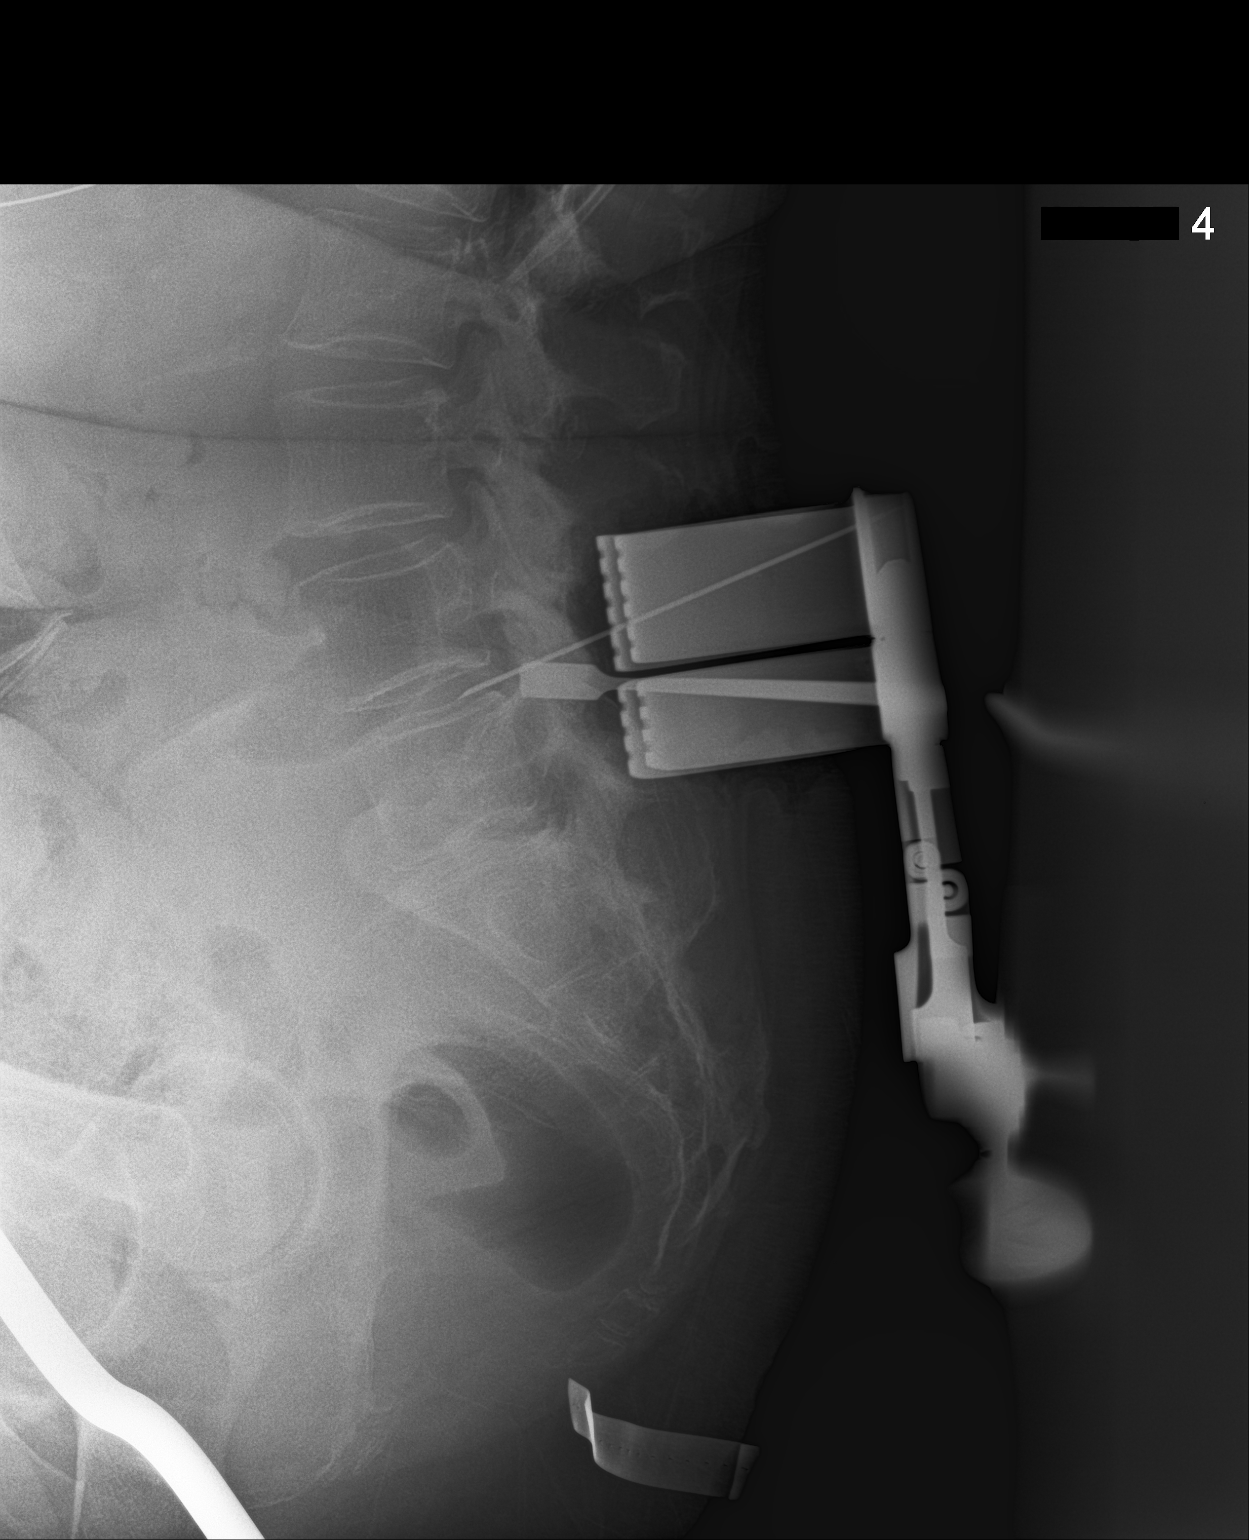

[1 of 1 positions shown; findings below may reference images not displayed]

FINDINGS: Single portable lateral view of the lumbar spine submitted. There is
metallic localization needle with posterior approach at L5-S1 level.
IMPRESSION: Metallic localization needle at L5-S1 disc level.

## 2016-08-25 IMAGING — DX DG SPINE 1V PORT
1 series · 1 of 1 positions shown · non-contrast
Comparison: 08/16/2015 at 9009 hours

CLINICAL DATA: Lumbar spine surgery.

EXAM:
PORTABLE SPINE - 1 VIEW

[ls-spine x-table]
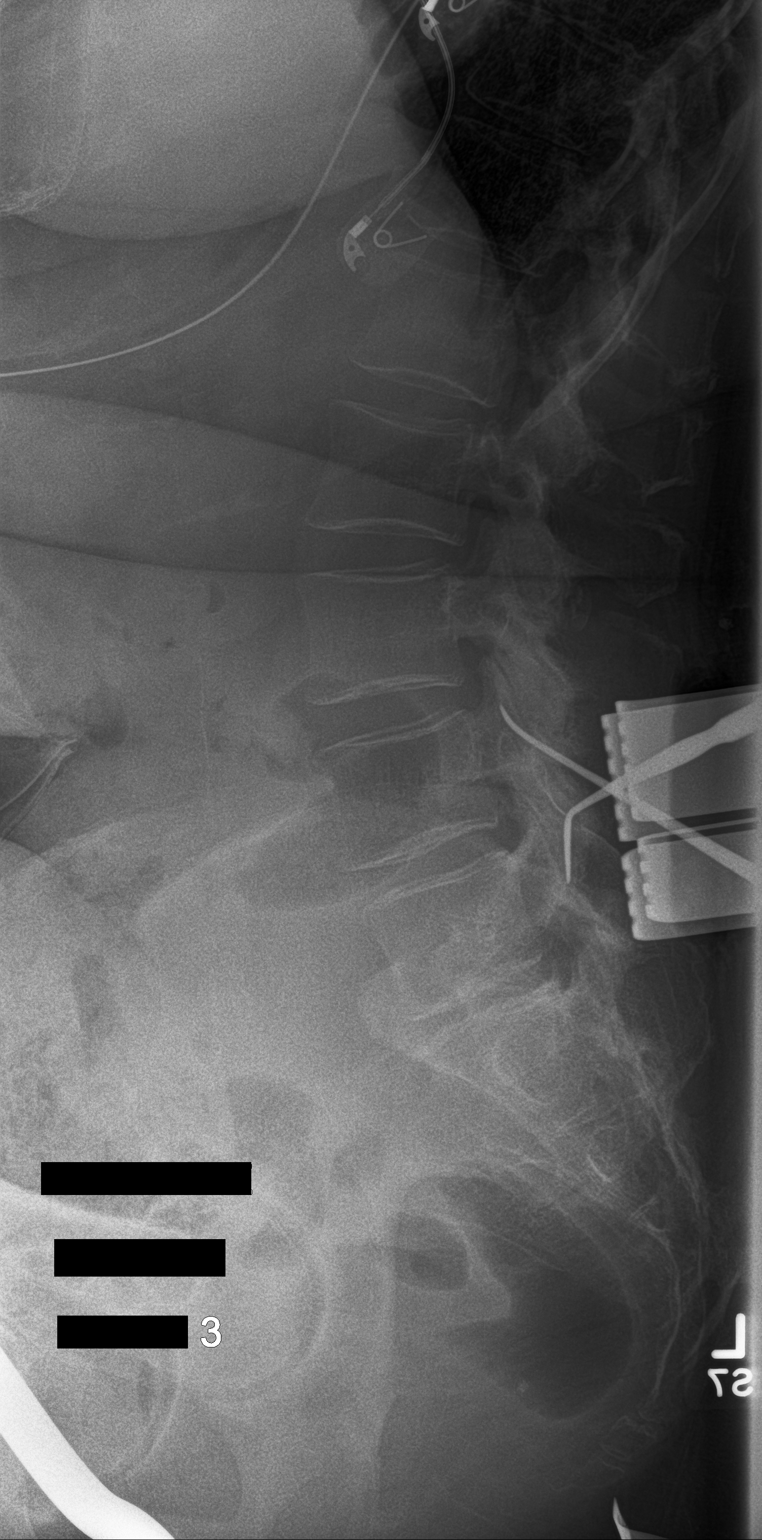

[1 of 1 positions shown; findings below may reference images not displayed]

FINDINGS: Transitional lumbosacral anatomy with numbering preserved from the
earlier study. Crossing surgical instruments are present with tips
projecting over the posterior aspects of the L5 and S1 pedicles.
Posterior soft tissue retractors are in place.
IMPRESSION: Intraoperative localization as above.

## 2016-09-01 ENCOUNTER — Encounter: Payer: Self-pay | Admitting: Internal Medicine

## 2016-09-01 DIAGNOSIS — H2513 Age-related nuclear cataract, bilateral: Secondary | ICD-10-CM | POA: Diagnosis not present

## 2016-09-01 DIAGNOSIS — E119 Type 2 diabetes mellitus without complications: Secondary | ICD-10-CM | POA: Diagnosis not present

## 2016-09-01 DIAGNOSIS — H25013 Cortical age-related cataract, bilateral: Secondary | ICD-10-CM | POA: Diagnosis not present

## 2016-09-01 DIAGNOSIS — H40013 Open angle with borderline findings, low risk, bilateral: Secondary | ICD-10-CM | POA: Diagnosis not present

## 2016-09-01 LAB — HM DIABETES EYE EXAM

## 2016-10-14 ENCOUNTER — Telehealth: Payer: Self-pay | Admitting: Internal Medicine

## 2016-10-14 NOTE — Telephone Encounter (Signed)
This is only the front page of the form. Typically the back of the form needs to be filled out by the patient if they are requesting medical excuse and we do not typically need to do anything with this. I would recommend that they fill out and return this form. If they need a medical letter after that let us know. The form gives clear directions on where it can be returned.

## 2016-10-14 NOTE — Telephone Encounter (Signed)
Do we have this paper? Is it in my box?

## 2016-10-14 NOTE — Telephone Encounter (Signed)
Pt informed of same.  

## 2016-10-14 NOTE — Telephone Encounter (Signed)
Pt's spouse called, was wondering if Pamela Morgan can have an excuse letter from [employer] due to difficulty hearing. She is faxing over the letter to [phone]. Please advise.

## 2016-10-14 NOTE — Telephone Encounter (Signed)
It is in your box now 

## 2016-10-14 NOTE — Telephone Encounter (Signed)
Faxed letter has been received per stefannie----giving request to dr Sharlet Salina

## 2016-10-15 NOTE — Telephone Encounter (Signed)
Waiting for daughter to fax over the copy of the front and the back for the form. Please follow up

## 2016-10-16 NOTE — Telephone Encounter (Signed)
LVM for pt to call back as soon as possible.   

## 2016-10-16 NOTE — Telephone Encounter (Signed)
LVM for pt to call back as soon as possible.   RE: PCP would to see pt to document and address the reasons for not being eligible for jury duty.  Pt does have an appt with health coach on 10/30/2016 and could be seen by PCP after if there is an opening on her schedule.

## 2016-10-16 NOTE — Telephone Encounter (Signed)
Needs visit to discuss reasons she cannot serve as we have never discussed her back or the hearing problems which is why she is stating she cannot serve.

## 2016-10-21 NOTE — Telephone Encounter (Signed)
Left patient vm to call back to schedule appt with provider

## 2016-10-21 NOTE — Telephone Encounter (Signed)
Please follow up with this..
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
## 2016-10-29 NOTE — Progress Notes (Signed)
Pre visit review using our clinic review tool, if applicable. No additional management support is needed unless otherwise documented below in the visit note. 

## 2016-10-30 ENCOUNTER — Ambulatory Visit (INDEPENDENT_AMBULATORY_CARE_PROVIDER_SITE_OTHER): Payer: Medicare Other

## 2016-10-30 VITALS — BP 130/68 | HR 53 | Resp 18 | Ht 63.0 in | Wt 176.0 lb

## 2016-10-30 DIAGNOSIS — Z0000 Encounter for general adult medical examination without abnormal findings: Secondary | ICD-10-CM | POA: Diagnosis not present

## 2016-10-30 NOTE — Patient Instructions (Addendum)
Eat heart healthy diet (full of fruits, vegetables, whole grains, lean protein, water--limit salt, fat, and sugar intake) and increase physical activity as tolerated.  Continue doing brain stimulating activities (puzzles, reading, adult coloring books, staying active) to keep memory sharp.   Bring a copy of your advance directives to your next office visit.  [employer] about [employer] coverage.   Will schedule bone scan with mammogram this summer.   Fall Prevention in the Home Introduction Falls can cause injuries. They can happen to people of all ages. There are many things you can do to make your home safe and to help prevent falls. What can I do on the outside of my home?  Regularly fix the edges of walkways and driveways and fix any cracks.  Remove anything that might make you trip as you walk through a door, such as a raised step or threshold.  Trim any bushes or trees on the path to your home.  Use bright outdoor lighting.  Clear any walking paths of anything that might make someone trip, such as rocks or tools.  Regularly check to see if handrails are loose or broken. Make sure that both sides of any steps have handrails.  Any raised decks and porches should have guardrails on the edges.  Have any leaves, snow, or ice cleared regularly.  Use sand or salt on walking paths during winter.  Clean up any spills in your garage right away. This includes oil or grease spills. What can I do in the bathroom?  Use night lights.  Install grab bars by the toilet and in the tub and shower. Do not use towel bars as grab bars.  Use non-skid mats or decals in the tub or shower.  If you need to sit down in the shower, use a plastic, non-slip stool.  Keep the floor dry. Clean up any water that spills on the floor as soon as it happens.  Remove soap buildup in the tub or shower regularly.  Attach bath mats securely with double-sided non-slip rug tape.  Do not have throw  rugs and other things on the floor that can make you trip. What can I do in the bedroom?  Use night lights.  Make sure that you have a light by your bed that is easy to reach.  Do not use any sheets or blankets that are too big for your bed. They should not hang down onto the floor.  Have a firm chair that has side arms. You can use this for support while you get dressed.  Do not have throw rugs and other things on the floor that can make you trip. What can I do in the kitchen?  Clean up any spills right away.  Avoid walking on wet floors.  Keep items that you use a lot in easy-to-reach places.  If you need to reach something above you, use a strong step stool that has a grab bar.  Keep electrical cords out of the way.  Do not use floor polish or wax that makes floors slippery. If you must use wax, use non-skid floor wax.  Do not have throw rugs and other things on the floor that can make you trip. What can I do with my stairs?  Do not leave any items on the stairs.  Make sure that there are handrails on both sides of the stairs and use them. Fix handrails that are broken or loose. Make sure that handrails are as long as the  stairways.  Check any carpeting to make sure that it is firmly attached to the stairs. Fix any carpet that is loose or worn.  Avoid having throw rugs at the top or bottom of the stairs. If you do have throw rugs, attach them to the floor with carpet tape.  Make sure that you have a light switch at the top of the stairs and the bottom of the stairs. If you do not have them, ask someone to add them for you. What else can I do to help prevent falls?  Wear shoes that:  Do not have high heels.  Have rubber bottoms.  Are comfortable and fit you well.  Are closed at the toe. Do not wear sandals.  If you use a stepladder:  Make sure that it is fully opened. Do not climb a closed stepladder.  Make sure that both sides of the stepladder are locked into  place.  Ask someone to hold it for you, if possible.  Clearly mark and make sure that you can see:  Any grab bars or handrails.  First and last steps.  Where the edge of each step is.  Use tools that help you move around (mobility aids) if they are needed. These include:  Canes.  Walkers.  Scooters.  Crutches.  Turn on the lights when you go into a dark area. Replace any light bulbs as soon as they burn out.  Set up your furniture so you have a clear path. Avoid moving your furniture around.  If any of your floors are uneven, fix them.  If there are any pets around you, be aware of where they are.  Review your medicines with your doctor. Some medicines can make you feel dizzy. This can increase your chance of falling. Ask your doctor what other things that you can do to help prevent falls. This information is not intended to replace advice given to you by your health care provider. Make sure you discuss any questions you have with your health care provider. Document Released: 08/02/2009 Document Revised: 03/13/2016 Document Reviewed: 11/10/2014  2017 [employer]  Health Maintenance, Female Introduction Adopting a healthy lifestyle and getting preventive care can go a long way to promote health and wellness. Talk with your health care provider about what schedule of regular examinations is right for you. This is a good chance for you to check in with your provider about disease prevention and staying healthy. In between checkups, there are plenty of things you can do on your own. Experts have done a lot of research about which lifestyle changes and preventive measures are most likely to keep you healthy. Ask your health care provider for more information. Weight and diet Eat a healthy diet  Be sure to include plenty of vegetables, fruits, low-fat dairy products, and lean protein.  Do not eat a lot of foods high in solid fats, added sugars, or salt.  Get regular exercise.  This is one of the most important things you can do for your health.  Most adults should exercise for at least 150 minutes each week. The exercise should increase your heart rate and make you sweat (moderate-intensity exercise).  Most adults should also do strengthening exercises at least twice a week. This is in addition to the moderate-intensity exercise. Maintain a healthy weight  Body mass index (BMI) is a measurement that can be used to identify possible weight problems. It estimates body fat based on height and weight. Your health care provider can help  determine your BMI and help you achieve or maintain a healthy weight.  For females 7 years of age and older:  A BMI below 18.5 is considered underweight.  A BMI of 18.5 to 24.9 is normal.  A BMI of 25 to 29.9 is considered overweight.  A BMI of 30 and above is considered obese. Watch levels of cholesterol and blood lipids  You should start having your blood tested for lipids and cholesterol at 70 years of age, then have this test every 5 years.  You may need to have your cholesterol levels checked more often if:  Your lipid or cholesterol levels are high.  You are older than 70 years of age.  You are at high risk for heart disease. Cancer screening Lung Cancer  Lung cancer screening is recommended for adults 76-47 years old who are at high risk for lung cancer because of a history of smoking.  A yearly low-dose CT scan of the lungs is recommended for people who:  Currently smoke.  Have quit within the past 15 years.  Have at least a 30-pack-year history of smoking. A pack year is smoking an average of one pack of cigarettes a day for 1 year.  Yearly screening should continue until it has been 15 years since you quit.  Yearly screening should stop if you develop a health problem that would prevent you from having lung cancer treatment. Breast Cancer  Practice breast self-awareness. This means understanding how your  breasts normally appear and feel.  It also means doing regular breast self-exams. Let your health care provider know about any changes, no matter how small.  If you are in your 20s or 30s, you should have a clinical breast exam (CBE) by a health care provider every 1-3 years as part of a regular health exam.  If you are 4 or older, have a CBE every year. Also consider having a breast X-ray (mammogram) every year.  If you have a family history of breast cancer, talk to your health care provider about genetic screening.  If you are at high risk for breast cancer, talk to your health care provider about having an MRI and a mammogram every year.  Breast cancer gene (BRCA) assessment is recommended for women who have family members with BRCA-related cancers. BRCA-related cancers include:  Breast.  Ovarian.  Tubal.  Peritoneal cancers.  Results of the assessment will determine the need for genetic counseling and BRCA1 and BRCA2 testing. Cervical Cancer  Your health care provider may recommend that you be screened regularly for cancer of the pelvic organs (ovaries, uterus, and vagina). This screening involves a pelvic examination, including checking for microscopic changes to the surface of your cervix (Pap test). You may be encouraged to have this screening done every 3 years, beginning at age 76.  For women ages 69-65, health care providers may recommend pelvic exams and Pap testing every 3 years, or they may recommend the Pap and pelvic exam, combined with testing for human papilloma virus (HPV), every 5 years. Some types of HPV increase your risk of cervical cancer. Testing for HPV may also be done on women of any age with unclear Pap test results.  Other health care providers may not recommend any screening for nonpregnant women who are considered low risk for pelvic cancer and who do not have symptoms. Ask your health care provider if a screening pelvic exam is right for you.  If you  have had past treatment for cervical cancer or  a condition that could lead to cancer, you need Pap tests and screening for cancer for at least 20 years after your treatment. If Pap tests have been discontinued, your risk factors (such as having a new sexual partner) need to be reassessed to determine if screening should resume. Some women have medical problems that increase the chance of getting cervical cancer. In these cases, your health care provider may recommend more frequent screening and Pap tests. Colorectal Cancer  This type of cancer can be detected and often prevented.  Routine colorectal cancer screening usually begins at 70 years of age and continues through 70 years of age.  Your health care provider may recommend screening at an earlier age if you have risk factors for colon cancer.  Your health care provider may also recommend using home test kits to check for hidden blood in the stool.  A small camera at the end of a tube can be used to examine your colon directly (sigmoidoscopy or colonoscopy). This is done to check for the earliest forms of colorectal cancer.  Routine screening usually begins at age 10.  Direct examination of the colon should be repeated every 5-10 years through 70 years of age. However, you may need to be screened more often if early forms of precancerous polyps or small growths are found. Skin Cancer  Check your skin from head to toe regularly.  Tell your health care provider about any new moles or changes in moles, especially if there is a change in a mole's shape or color.  Also tell your health care provider if you have a mole that is larger than the size of a pencil eraser.  Always use sunscreen. Apply sunscreen liberally and repeatedly throughout the day.  Protect yourself by wearing long sleeves, pants, a wide-brimmed hat, and sunglasses whenever you are outside. Heart disease, diabetes, and high blood pressure  High blood pressure causes heart  disease and increases the risk of stroke. High blood pressure is more likely to develop in:  People who have blood pressure in the high end of the normal range (130-139/85-89 mm Hg).  People who are overweight or obese.  People who are African American.  If you are 33-10 years of age, have your blood pressure checked every 3-5 years. If you are 80 years of age or older, have your blood pressure checked every year. You should have your blood pressure measured twice-once when you are at a hospital or clinic, and once when you are not at a hospital or clinic. Record the average of the two measurements. To check your blood pressure when you are not at a hospital or clinic, you can use:  An automated blood pressure machine at a pharmacy.  A home blood pressure monitor.  If you are between 34 years and 26 years old, ask your health care provider if you should take aspirin to prevent strokes.  Have regular diabetes screenings. This involves taking a blood sample to check your fasting blood sugar level.  If you are at a normal weight and have a low risk for diabetes, have this test once every three years after 70 years of age.  If you are overweight and have a high risk for diabetes, consider being tested at a younger age or more often. Preventing infection Hepatitis B  If you have a higher risk for hepatitis B, you should be screened for this virus. You are considered at high risk for hepatitis B if:  You were born in  a country where hepatitis B is common. Ask your health care provider which countries are considered high risk.  Your parents were born in a high-risk country, and you have not been immunized against hepatitis B (hepatitis B vaccine).  You have HIV or AIDS.  You use needles to inject street drugs.  You live with someone who has hepatitis B.  You have had sex with someone who has hepatitis B.  You get hemodialysis treatment.  You take certain medicines for conditions,  including cancer, organ transplantation, and autoimmune conditions. Hepatitis C  Blood testing is recommended for:  Everyone born from [birth year] through 1964.  Anyone with known risk factors for hepatitis C. Sexually transmitted infections (STIs)  You should be screened for sexually transmitted infections (STIs) including gonorrhea and chlamydia if:  You are sexually active and are younger than 70 years of age.  You are older than 70 years of age and your health care provider tells you that you are at risk for this type of infection.  Your sexual activity has changed since you were last screened and you are at an increased risk for chlamydia or gonorrhea. Ask your health care provider if you are at risk.  If you do not have HIV, but are at risk, it may be recommended that you take a prescription medicine daily to prevent HIV infection. This is called pre-exposure prophylaxis (PrEP). You are considered at risk if:  You are sexually active and do not regularly use condoms or know the HIV status of your partner(s).  You take drugs by injection.  You are sexually active with a partner who has HIV. Talk with your health care provider about whether you are at high risk of being infected with HIV. If you choose to begin PrEP, you should first be tested for HIV. You should then be tested every 3 months for as long as you are taking PrEP. Pregnancy  If you are premenopausal and you may become pregnant, ask your health care provider about preconception counseling.  If you may become pregnant, take 400 to 800 micrograms (mcg) of folic acid every day.  If you want to prevent pregnancy, talk to your health care provider about birth control (contraception). Osteoporosis and menopause  Osteoporosis is a disease in which the bones lose minerals and strength with aging. This can result in serious bone fractures. Your risk for osteoporosis can be identified using a bone density scan.  If you are 77  years of age or older, or if you are at risk for osteoporosis and fractures, ask your health care provider if you should be screened.  Ask your health care provider whether you should take a calcium or vitamin D supplement to lower your risk for osteoporosis.  Menopause may have certain physical symptoms and risks.  Hormone replacement therapy may reduce some of these symptoms and risks. Talk to your health care provider about whether hormone replacement therapy is right for you. Follow these instructions at home:  Schedule regular health, dental, and eye exams.  Stay current with your immunizations.  Do not use any tobacco products including cigarettes, chewing tobacco, or electronic cigarettes.  If you are pregnant, do not drink alcohol.  If you are breastfeeding, limit how much and how often you drink alcohol.  Limit alcohol intake to no more than 1 drink per day for nonpregnant women. One drink equals 12 ounces of beer, 5 ounces of wine, or 1 ounces of hard liquor.  Do not use street  drugs.  Do not share needles.  Ask your health care provider for help if you need support or information about quitting drugs.  Tell your health care provider if you often feel depressed.  Tell your health care provider if you have ever been abused or do not feel safe at home. This information is not intended to replace advice given to you by your health care provider. Make sure you discuss any questions you have with your health care provider. Document Released: 04/21/2011 Document Revised: 03/13/2016 Document Reviewed: 07/10/2015  2017 [employer]vier

## 2016-10-30 NOTE — Progress Notes (Signed)
Medical screening examination/treatment/procedure(s) were performed by non-physician practitioner and as supervising physician I was immediately available for consultation/collaboration. I agree with above. Donevan Biller A Amari Burnsworth, MD 

## 2016-11-04 ENCOUNTER — Encounter: Payer: Self-pay | Admitting: Internal Medicine

## 2016-11-04 ENCOUNTER — Ambulatory Visit (INDEPENDENT_AMBULATORY_CARE_PROVIDER_SITE_OTHER): Payer: Medicare Other | Admitting: Internal Medicine

## 2016-11-04 DIAGNOSIS — J4531 Mild persistent asthma with (acute) exacerbation: Secondary | ICD-10-CM

## 2016-11-04 DIAGNOSIS — M48062 Spinal stenosis, lumbar region with neurogenic claudication: Secondary | ICD-10-CM | POA: Diagnosis not present

## 2016-11-04 MED ORDER — PREDNISONE 20 MG PO TABS: 40.0000 mg | ORAL_TABLET | Freq: Every day | ORAL | 0 refills | Status: DC

## 2016-11-04 MED ORDER — DOXYCYCLINE HYCLATE 100 MG PO TABS: 100.0000 mg | ORAL_TABLET | Freq: Two times a day (BID) | ORAL | 0 refills | Status: DC

## 2016-11-04 NOTE — Patient Instructions (Signed)
We have sent in prednisone for the breathing. Take 2 pills a day for 5 days.   We have also sent in an antibiotic called doxycycline. Take 1 pill twice a day for 1 week.

## 2016-11-04 NOTE — Progress Notes (Signed)
Pre visit review using our clinic review tool, if applicable. No additional management support is needed unless otherwise documented below in the visit note. 

## 2016-11-04 NOTE — Progress Notes (Signed)
   Subjective:    [name], female    DOB: 02-Jan-1946, 71 y.o.   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 She has had many problems with her back and spine over the years and has had many surgeries. She is not able to sit or stand for longer than 30 minutes and does not feel that she is able to serve where she is required to sit for long periods of time. She does not have functional hearing problems at this time. She is also not confident with her mental capacity as she does sometimes get confused about details of things.   She is also having some respiratory infection for the last 2 weeks. Having cough with production. She is having some more SOB. She denies fevers but having some chills. This is limiting her activity. Mild sinus congestion. Overall worsening. She has tried over the counter medicine without relief. Using albuterol inhaler more often. Still taking symbicort.   Review of Systems  Constitutional: Positive for activity change and chills. Negative for appetite change, fatigue, fever and unexpected weight change.  HENT: Positive for congestion, postnasal drip, rhinorrhea and sore throat. Negative for dental problem, drooling, ear discharge, ear pain, sinus pain, sinus pressure and trouble swallowing.   Eyes: Negative.   Respiratory: Positive for cough, shortness of breath and wheezing. Negative for apnea and choking.   Cardiovascular: Negative.   Gastrointestinal: Negative.   Musculoskeletal: Positive for arthralgias, back pain and gait problem. Negative for joint swelling, myalgias, neck pain and neck stiffness.  Skin: Negative.   Neurological: Positive for weakness.      Objective:   Physical Exam  Constitutional: She is oriented to person, place, and time. She appears well-developed and well-nourished.  HENT:  Head: Normocephalic and atraumatic.  Oropharynx with redness and clear  drainage, nose without crusting, some sinus pressure.   Eyes: EOM are normal.  Neck: Normal range of motion.  Cardiovascular: Normal rate and regular rhythm.   Pulmonary/Chest: Effort normal. No respiratory distress. She has wheezes. She has no rales. She exhibits no tenderness.  Some wheezing which does not clear with coughing.   Abdominal: Soft.  Musculoskeletal: She exhibits tenderness.  She is uncomfortable sitting in the exam room with her back.   Lymphadenopathy:    She has no cervical adenopathy.  Neurological: She is alert and oriented to person, place, and time. Coordination abnormal.  Slow gait and slow to rise  Skin: Skin is warm and dry.   Vitals:   11/04/16 1322  BP: (!) 160/70  Pulse: 67  Resp: 12  Temp: 98.4 F (36.9 C)  TempSrc: Oral  SpO2: 99%  Weight: 179 lb (81.2 kg)  Height: 5\' 3"  (1.6 m)      Assessment & Plan:

## 2016-11-07 NOTE — Assessment & Plan Note (Signed)
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 Talked with her about the form from the government that she needs to fill out. She will have her daughter do this and also talked to them about the need to take this back to the government and then follow up to see if she is excused as she may or may not be excused from duty.

## 2016-11-07 NOTE — Assessment & Plan Note (Signed)
Complicated today by flare. Rx for prednisone and doxycycline. She is advised to continue using albuterol prn and symbicort scheduled. She is also used flonase and singulair and allegra as well which she will continue.

## 2017-02-27 DIAGNOSIS — H40013 Open angle with borderline findings, low risk, bilateral: Secondary | ICD-10-CM | POA: Diagnosis not present

## 2017-02-27 DIAGNOSIS — H04123 Dry eye syndrome of bilateral lacrimal glands: Secondary | ICD-10-CM | POA: Diagnosis not present

## 2017-03-27 ENCOUNTER — Encounter: Payer: Self-pay | Admitting: Gynecology

## 2017-03-27 ENCOUNTER — Ambulatory Visit (INDEPENDENT_AMBULATORY_CARE_PROVIDER_SITE_OTHER): Payer: Medicare Other | Admitting: Gynecology

## 2017-03-27 VITALS — BP 126/82 | Ht 63.0 in | Wt 175.0 lb

## 2017-03-27 DIAGNOSIS — N952 Postmenopausal atrophic vaginitis: Secondary | ICD-10-CM

## 2017-03-27 DIAGNOSIS — R21 Rash and other nonspecific skin eruption: Secondary | ICD-10-CM

## 2017-03-27 DIAGNOSIS — Z01411 Encounter for gynecological examination (general) (routine) with abnormal findings: Secondary | ICD-10-CM

## 2017-03-27 MED ORDER — BETAMETHASONE DIPROPIONATE 0.05 % EX CREA: TOPICAL_CREAM | Freq: Two times a day (BID) | CUTANEOUS | 0 refills | Status: DC

## 2017-03-27 NOTE — Patient Instructions (Addendum)
Apply the steroid cream twice daily to the rash area.  Follow up in 1-2 months for reexamination.  Schedule your mammogram

## 2017-03-27 NOTE — Progress Notes (Signed)
    Pamela Morgan [birth year] [id number]        71 y.o.  G2P2 for breast and pelvic exam. Patient complains of several months of persistent rash area on her left buttocks. Has been applying alcohol to the area. Has been present daily and very itchy. No crusting or drainage. No history of same before.  Past medical history,surgical history, problem list, medications, allergies, family history and social history were all reviewed and documented as reviewed in the EPIC chart.  ROS:  Performed with pertinent positives and negatives included in the history, assessment and plan.   Additional significant findings :  None   Exam: [name] assistant Vitals:   03/27/17 0850  BP: 126/82  Weight: 175 lb (79.4 kg)  Height: 5\' 3"  (1.6 m)   Body mass index is 31 kg/m.  General appearance:  Normal affect, orientation and appearance. Skin: Grossly normal HEENT: Without gross lesions.  No cervical or supraclavicular adenopathy. Thyroid normal.  Lungs:  Clear without wheezing, rales or rhonchi Cardiac: RR, without RMG Abdominal:  Soft, nontender, without masses, guarding, rebound, organomegaly or hernia Breasts:  Examined lying and sitting without masses, retractions, discharge or axillary adenopathy. Pelvic:  Ext, BUS, Vagina: With atrophic changes. 2-3 cm rash/desquamated crusting area left buttocks. No evidence of cellulitis. No vesicles or pigmented changes.  Adnexa: Without masses or tenderness    Anus and perineum: Normal   Rectovaginal: Normal sphincter tone without palpated masses or tenderness.  Physical Exam  Genitourinary:         Assessment/Plan:  71 y.o. G2P2 female for breast and pelvic exam..   1. Persistent rash left buttocks with irritative appearance. I think this is secondary to her scratching and applying alcohol. Does not appear as classic viral noting history of presence daily for the last several months which goes against viral. Will treat as a inflammatory/eczema  type patch with Diprolene 0.05% cream twice daily. Recommended patient follow up in one month for reinspection. If persisting to any degree then will plan biopsy. If resolving/resolved then expectant management. 2. Postmenopausal/atrophic genital changes. Status post TAH/RSO in the past for bleeding. Doing well without significant hot flushes, night sweats, vaginal dryness. 3. Mammography overdue with last mammogram 2016. Recommended patient schedule screening mammogram and she agrees to do so. Breast exam is normal today. SBE monthly reviewed. 4. Pap smear 2012. No Pap smear done today. No history of abnormal Pap smears. We both agree per current screening guidelines to stop screening based on age and hysterectomy history. 5. DEXA 2015 normal. Plan repeat DEXA at 5 year interval. 6. Colonoscopy 2009 with reported repeat interval 10 years area 7. Health maintenance. No routine lab work done as patient reports this done elsewhere. Follow up in one month for reexamination otherwise 1 year for annual exam.  Additional time in excess of her breast and pelvic exam was spent in direct face to face counseling and coordination of care in regards to her buttocks rash with discussion of differential and treatment provided.    Anastasio Auerbach MD, 9:17 AM 03/27/2017

## 2017-04-27 ENCOUNTER — Ambulatory Visit (INDEPENDENT_AMBULATORY_CARE_PROVIDER_SITE_OTHER): Payer: Medicare Other | Admitting: Family Medicine

## 2017-04-27 ENCOUNTER — Encounter: Payer: Self-pay | Admitting: Family Medicine

## 2017-04-27 VITALS — BP 170/100 | HR 58 | Temp 98.5°F | Resp 12 | Ht 63.0 in | Wt 175.0 lb

## 2017-04-27 DIAGNOSIS — E1165 Type 2 diabetes mellitus with hyperglycemia: Secondary | ICD-10-CM

## 2017-04-27 DIAGNOSIS — I10 Essential (primary) hypertension: Secondary | ICD-10-CM

## 2017-04-27 DIAGNOSIS — J4521 Mild intermittent asthma with (acute) exacerbation: Secondary | ICD-10-CM | POA: Diagnosis not present

## 2017-04-27 DIAGNOSIS — E11319 Type 2 diabetes mellitus with unspecified diabetic retinopathy without macular edema: Secondary | ICD-10-CM | POA: Diagnosis not present

## 2017-04-27 MED ORDER — AMOXICILLIN-POT CLAVULANATE 875-125 MG PO TABS: 1.0000 | ORAL_TABLET | Freq: Two times a day (BID) | ORAL | 0 refills | Status: DC

## 2017-04-27 MED ORDER — PREDNISONE 20 MG PO TABS: ORAL_TABLET | ORAL | 0 refills | Status: DC

## 2017-04-27 MED ORDER — ALBUTEROL SULFATE (2.5 MG/3ML) 0.083% IN NEBU: 2.5000 mg | INHALATION_SOLUTION | Freq: Four times a day (QID) | RESPIRATORY_TRACT | 1 refills | Status: DC | PRN

## 2017-04-27 MED ORDER — IPRATROPIUM-ALBUTEROL 0.5-2.5 (3) MG/3ML IN SOLN
3.0000 mL | Freq: Once | RESPIRATORY_TRACT | Status: AC
  Administered 2017-04-27: 3 mL via RESPIRATORY_TRACT

## 2017-04-27 NOTE — Patient Instructions (Signed)
Please take plain mucinex (generic is fine)  Start your antibiotic and prednisone tonight  Drink enough liquid to make your urine light yellow   If worsening shortness of breath, call 911  Follow up in 2-4 days
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
## 2017-04-28 ENCOUNTER — Ambulatory Visit (INDEPENDENT_AMBULATORY_CARE_PROVIDER_SITE_OTHER): Payer: Medicare Other | Admitting: Gynecology

## 2017-04-28 ENCOUNTER — Encounter: Payer: Self-pay | Admitting: Gynecology

## 2017-04-28 VITALS — BP 140/84

## 2017-04-28 DIAGNOSIS — R21 Rash and other nonspecific skin eruption: Secondary | ICD-10-CM

## 2017-04-28 NOTE — Patient Instructions (Signed)
Continue applying the steroid cream to the rash twice daily  Follow up in August for reexamination

## 2017-04-28 NOTE — Progress Notes (Signed)
    Pamela Morgan 20-Apr-1946 [id number]        71 y.o.  G2P2 presents in follow up of her buttocks skin rash. She had a desquamating patch left buttocks that we started treating with Diprolene 0.05% cream twice daily. She notes the itching has now resolved and she is feeling better.  Past medical history,surgical history, problem list, medications, allergies, family history and social history were all reviewed and documented in the EPIC chart.  Directed ROS with pertinent positives and negatives documented in the history of present illness/assessment and plan.  Exam: [name] assistant Vitals:   04/28/17 1008  BP: 140/84   General appearance:  Normal Pelvic external BUS vagina with atrophic changes. Circular patch of hyperkeratotic skin left buttocks. Improved from prior exam with no desquamated areas. Physical Exam  Genitourinary:        Assessment/Plan:  71 y.o. G2P2 with well-defined hyperkeratotic area left buttocks. Improved from prior exam. Recommended that she continues with the steroid cream twice daily and follow up in one month. If any residual areas then we'll biopsy at that time. We'll hold on biopsy now as it does dramatically look better. Patient agrees and knows importance follow up in one month.    Anastasio Auerbach MD, 10:20 AM 04/28/2017

## 2017-04-30 ENCOUNTER — Encounter: Payer: Self-pay | Admitting: Nurse Practitioner

## 2017-04-30 ENCOUNTER — Ambulatory Visit (INDEPENDENT_AMBULATORY_CARE_PROVIDER_SITE_OTHER): Payer: Medicare Other | Admitting: Nurse Practitioner

## 2017-04-30 VITALS — BP 146/70 | HR 50 | Temp 98.3°F | Ht 63.0 in | Wt 177.0 lb

## 2017-04-30 DIAGNOSIS — J4521 Mild intermittent asthma with (acute) exacerbation: Secondary | ICD-10-CM | POA: Diagnosis not present

## 2017-04-30 NOTE — Patient Instructions (Signed)
Continue current medications as prescribed, Monitor glucose closely due to effects or oral prednisone on DM. Call office if glucose >323for more that 3days.  Encourage adequate oral hydration.  May also use mucinex Dm or delsym prn for cough.  Call office if no improvement in 2weeks

## 2017-04-30 NOTE — Progress Notes (Addendum)
Subjective:  [name], female    DOB: 1946/10/09  Age: 71 y.o. MRN: [id number]  CC: Follow-up (still have a little coughing, breathing is a little better)   Cough  This is a recurrent problem. The current episode started in the past 7 days. The problem has been gradually improving. The cough is productive of purulent sputum. Associated symptoms include nasal congestion, postnasal drip, rhinorrhea and shortness of breath. Pertinent negatives include no chest pain, chills, fever or wheezing. She has tried a beta-agonist inhaler, OTC cough suppressant, oral steroids and rest (and oral abx) for the symptoms. The treatment provided moderate relief. Her past medical history is significant for asthma and environmental allergies.    Outpatient Medications Prior to Visit  Medication Sig Dispense Refill  . albuterol (PROVENTIL HFA;VENTOLIN HFA) 108 (90 Base) MCG/ACT inhaler Inhale 1-2 puffs into the lungs every 6 (six) hours as needed for wheezing or shortness of breath. 3 Inhaler 1  . albuterol (PROVENTIL) (2.5 MG/3ML) 0.083% nebulizer solution Take 3 mLs (2.5 mg total) by nebulization every 6 (six) hours as needed for wheezing or shortness of breath. 150 mL 1  . amLODipine (NORVASC) 5 MG tablet Take 1 tablet (5 mg total) by mouth daily. 90 tablet 3  . amoxicillin-clavulanate (AUGMENTIN) 875-125 MG tablet Take 1 tablet by mouth 2 (two) times daily. 14 tablet 0  . betamethasone dipropionate (DIPROLENE) 0.05 % cream Apply topically 2 (two) times daily. 30 g 0  . budesonide-formoterol (SYMBICORT) 80-4.5 MCG/ACT inhaler Inhale 2 puffs into the lungs 2 (two) times daily. 3 Inhaler 1  . diclofenac sodium (VOLTAREN) 1 % GEL Apply 2 g topically 3 (three) times daily as needed. 200 g 6  . docusate sodium (COLACE) 100 MG capsule Take 100 mg by mouth daily.    . famotidine (PEPCID) 20 MG tablet Take 1 tablet (20 mg total) by mouth daily. (Patient taking differently: Take 20 mg by mouth daily as  needed for heartburn. )    . fexofenadine (ALLEGRA) 180 MG tablet Take 1 tablet (180 mg total) by mouth daily. 90 tablet 3  . fluticasone (FLONASE) 50 MCG/ACT nasal spray Place 2 sprays into both nostrils daily. 48 g 3  . glipiZIDE (GLUCOTROL XL) 2.5 MG 24 hr tablet TAKE 1 TABLET(2.5 MG) BY MOUTH DAILY WITH BREAKFAST 180 tablet 3  . glucose blood (ONE TOUCH ULTRA TEST) test strip 1 each by Other route 2 (two) times daily. Use to check blood sugars twice a day Dx E11.9 100 each 3  . Insulin Detemir (LEVEMIR FLEXPEN) 100 UNIT/ML Pen Inject 20 Units into the skin daily at 10 pm. 15 mL 1  . ipratropium-albuterol (DUONEB) 0.5-2.5 (3) MG/3ML SOLN Take 3 mLs by nebulization every 4 (four) hours as needed (((PLAN C)) wheezing, shortness of breath). 360 mL 3  . Lancets (ONETOUCH ULTRASOFT) lancets Use as instructed 100 each 2  . montelukast (SINGULAIR) 10 MG tablet Take 1 tablet (10 mg total) by mouth at bedtime. 90 tablet 1  . naproxen sodium (ANAPROX) 220 MG tablet Take 220 mg by mouth daily.     Marland Kitchen olopatadine (PATANOL) 0.1 % ophthalmic solution Place 1 drop into both eyes 2 (two) times daily as needed for allergies.     . pantoprazole (PROTONIX) 40 MG tablet Take 1 tablet (40 mg total) by mouth daily. Take 30-60 min before first meal of the day (Patient taking differently: Take 40 mg by mouth daily as needed (Heartburn). Take 30-60 min before first  meal of the day) 30 tablet 2  . potassium chloride SA (K-DUR,KLOR-CON) 20 MEQ tablet Take 1 tablet (20 mEq total) by mouth daily. 3 tablet 0  . predniSONE (DELTASONE) 20 MG tablet Take 3 tabs x 3 days, then 2 x 3 days, then 1 x 3 days 18 tablet 0  . sitaGLIPtin (JANUVIA) 100 MG tablet Take 1 tablet (100 mg total) by mouth daily. 90 tablet 3  . traMADol (ULTRAM) 50 MG tablet Take 1-2 tablets (50-100 mg total) by mouth every 6 (six) hours as needed for moderate pain or severe pain. 80 tablet 0  . valsartan-hydrochlorothiazide (DIOVAN HCT) 160-25 MG tablet Take 1  tablet by mouth daily. 90 tablet 3   No facility-administered medications prior to visit.     ROS See HPI  Objective:  BP (!) 146/70   Pulse (!) 50   Temp 98.3 F (36.8 C)   Ht 5\' 3"  (1.6 m)   Wt 177 lb (80.3 kg)   LMP 03/19/1986   SpO2 98%   BMI 31.35 kg/m   BP Readings from Last 3 Encounters:  04/30/17 (!) 146/70  04/28/17 140/84  04/27/17 (!) 170/100    Wt Readings from Last 3 Encounters:  04/30/17 177 lb (80.3 kg)  04/27/17 175 lb (79.4 kg)  03/27/17 175 lb (79.4 kg)    Physical Exam  Constitutional: She is oriented to person, place, and time. No distress.  HENT:  Right Ear: External ear normal.  Left Ear: External ear normal.  Nose: Nose normal.  Mouth/Throat: Oropharynx is clear and moist. No oropharyngeal exudate.  Neck: Normal range of motion. Neck supple.  Cardiovascular: Normal rate, regular rhythm and normal heart sounds.   Pulmonary/Chest: Effort normal and breath sounds normal. No respiratory distress. She has no wheezes. She has no rales.  Musculoskeletal: She exhibits no edema.  Neurological: She is alert and oriented to person, place, and time.  Skin: Skin is warm and dry.  Vitals reviewed.   Lab Results  Component Value Date   WBC 9.5 07/17/2016   HGB 12.9 07/17/2016   HCT 38.8 07/17/2016   PLT 253.0 07/17/2016   GLUCOSE 97 07/17/2016   CHOL 196 07/17/2016   TRIG 99.0 07/17/2016   HDL 52.20 07/17/2016   LDLDIRECT 138.9 02/22/2013   LDLCALC 124 (H) 07/17/2016   ALT 15 07/17/2016   AST 17 07/17/2016   NA 142 07/17/2016   K 3.2 (L) 07/17/2016   CL 102 07/17/2016   CREATININE 0.94 07/17/2016   BUN 10 07/17/2016   CO2 31 07/17/2016   TSH 1.77 02/22/2013   INR 1.05 08/07/2015   HGBA1C 6.5 07/17/2016   MICROALBUR <0.7 05/07/2015    Dg Lumbar Spine 2-3 Views  Result Date: 08/07/2015 CLINICAL DATA:  71 year old female with a history of lumbar spine pain EXAM: LUMBAR SPINE - 2-3 VIEW COMPARISON:  05/03/2015, 12/20/2013, 06/13/2011,  prior chest x-ray 07/03/2015, 05/03/2015. FINDINGS: Lumbar Spine: Note that numbering system for the current study is based upon the numbering system used on the most recent comparison plain film 05/03/2015. This designates S1 as a transitional vertebral body, with a left-sided pseudoarticulation with the pelvis. There are then 5 lumbar type non rib-bearing vertebral bodies. Twelve rib pairs are identified on chest x-ray. Lumbar vertebral elements maintain normal alignment without evidence of anterolisthesis, retrolisthesis, subluxation. No fracture line identified.  Vertebral body heights maintained. Mild disc space loss of the lumbar spine, though most pronounced at the L5-S1 and S1-S2 levels. Developing facet disease of  L4-L5 and L5-S1. Unremarkable appearance of the visualized abdomen. IMPRESSION: Negative for acute fracture or malalignment of the lumbar spine. 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 Signed, Dulcy Fanny. Earleen Newport, DO [facility] Electronically Signed   By: Corrie Mckusick D.O.   On: 08/07/2015 16:45    Assessment & Plan:   Tanaiya was seen today for follow-up.  Diagnoses and all orders for this visit:  Mild intermittent asthma with acute exacerbation   I am having Ms. Bonafede maintain her fexofenadine, naproxen sodium, pantoprazole, olopatadine, Insulin Detemir, potassium chloride SA, famotidine, docusate sodium, glucose blood, onetouch ultrasoft, traMADol, amLODipine, glipiZIDE, sitaGLIPtin, valsartan-hydrochlorothiazide, fluticasone, montelukast, ipratropium-albuterol, budesonide-formoterol, albuterol, diclofenac sodium, betamethasone dipropionate, predniSONE, albuterol, and amoxicillin-clavulanate.  No orders of the defined types were placed in this encounter.   Follow-up: Return if symptoms worsen or fail to  improve.  Wilfred Lacy, NP

## 2017-05-29 ENCOUNTER — Ambulatory Visit (INDEPENDENT_AMBULATORY_CARE_PROVIDER_SITE_OTHER): Payer: Medicare Other | Admitting: Gynecology

## 2017-05-29 ENCOUNTER — Encounter: Payer: Self-pay | Admitting: Gynecology

## 2017-05-29 VITALS — BP 126/74

## 2017-05-29 DIAGNOSIS — B079 Viral wart, unspecified: Secondary | ICD-10-CM | POA: Diagnosis not present

## 2017-05-29 DIAGNOSIS — N909 Noninflammatory disorder of vulva and perineum, unspecified: Secondary | ICD-10-CM | POA: Diagnosis not present

## 2017-05-29 NOTE — Patient Instructions (Signed)
Office will call with biopsy results 

## 2017-05-29 NOTE — Progress Notes (Signed)
    Pamela Morgan June 16, 1946 [id number]        71 y.o.  G2P2 presents for follow up exam. History of hyperkeratotic skin lesion left buttock/perineum. Was weepy but now dry. Using Diprolene twice daily.   Past medical history,surgical history, problem list, medications, allergies, family history and social history were all reviewed and documented in the EPIC chart.  Directed ROS with pertinent positives and negatives documented in the history of present illness/assessment and plan.  Exam: Pamela Morgan assistant Vitals:   05/29/17 0759  BP: 126/74   General appearance:  Normal External BUS vagina with well-defined hyperkeratotic patch left buttocks/perineum. No skin breaks or oozing  Physical Exam  Genitourinary:       Procedure: Skin overlying the hyperkeratotic skin lesion was cleansed with Betadine and infiltrated with 1% lidocaine. An elliptical excisional skin biopsy was taken. Monsel solution hemostasis applied afterwards. Small pressure dressing applied. Postoperative instructions given.  Assessment/Plan:  71 y.o. G2P2 with persistent hyperkeratotic skin lesion. Biopsy taken. Patient will follow up for biopsy results and then we'll decide ongoing treatment options. I may increase the dose of her steroid cream such as clobetasol 0.05%. May also consider referral to dermatologist. Patient knows the importance follow up and will call us if she does not hear with the biopsy results in one week    Pamela Auerbach MD, 8:42 AM 05/29/2017

## 2017-06-02 LAB — PATHOLOGY

## 2017-06-04 ENCOUNTER — Telehealth: Payer: Self-pay | Admitting: *Deleted

## 2017-06-04 NOTE — Telephone Encounter (Signed)
Appointment on 06/25/17 @ 1:45pm with PA Anderson Malta, notes faxed to [phone]. Pt informed with time and date.

## 2017-06-04 NOTE — Telephone Encounter (Signed)
-----   Message from Ramond Craver, [place] sent at 06/03/2017  2:25 PM EDT ----- Regarding: referral to dermatologist Per Dr. Loetta Rough "Tell patient that the biopsy came back consistent with a large wart. Did not show any atypical changes.  I think given the size of the I would recommend following up with a dermatologist to see how they would recommend treating this. It may require something different than the steroid cream such as freezing the area or applying anti-wart medication. So I would recommend appointment with dermatologist which we can help her arrange."  Patient wants you to arrange dermatologist appointment. Thanks!!!!

## 2017-06-21 ENCOUNTER — Emergency Department (HOSPITAL_COMMUNITY)
Admission: EM | Admit: 2017-06-21 | Discharge: 2017-06-22 | Disposition: A | Discharge status: Home / Self Care | Payer: Medicare Other | Attending: Emergency Medicine | Admitting: Emergency Medicine

## 2017-06-21 ENCOUNTER — Encounter (HOSPITAL_COMMUNITY): Payer: Self-pay | Admitting: Emergency Medicine

## 2017-06-21 ENCOUNTER — Emergency Department (HOSPITAL_COMMUNITY): Payer: Medicare Other

## 2017-06-21 DIAGNOSIS — Z794 Long term (current) use of insulin: Secondary | ICD-10-CM | POA: Diagnosis not present

## 2017-06-21 DIAGNOSIS — R069 Unspecified abnormalities of breathing: Secondary | ICD-10-CM | POA: Diagnosis not present

## 2017-06-21 DIAGNOSIS — Z91010 Allergy to peanuts: Secondary | ICD-10-CM | POA: Insufficient documentation

## 2017-06-21 DIAGNOSIS — I10 Essential (primary) hypertension: Secondary | ICD-10-CM | POA: Diagnosis not present

## 2017-06-21 DIAGNOSIS — R0602 Shortness of breath: Secondary | ICD-10-CM | POA: Diagnosis not present

## 2017-06-21 DIAGNOSIS — E113599 Type 2 diabetes mellitus with proliferative diabetic retinopathy without macular edema, unspecified eye: Secondary | ICD-10-CM | POA: Diagnosis not present

## 2017-06-21 DIAGNOSIS — Z91040 Latex allergy status: Secondary | ICD-10-CM | POA: Diagnosis not present

## 2017-06-21 DIAGNOSIS — Z79899 Other long term (current) drug therapy: Secondary | ICD-10-CM | POA: Insufficient documentation

## 2017-06-21 DIAGNOSIS — J4521 Mild intermittent asthma with (acute) exacerbation: Secondary | ICD-10-CM | POA: Insufficient documentation

## 2017-06-21 DIAGNOSIS — R05 Cough: Secondary | ICD-10-CM | POA: Diagnosis not present

## 2017-06-21 HISTORY — DX: Unspecified asthma, uncomplicated: J45.909

## 2017-06-21 MED ORDER — ALBUTEROL SULFATE (2.5 MG/3ML) 0.083% IN NEBU: 5.0000 mg | INHALATION_SOLUTION | Freq: Once | RESPIRATORY_TRACT | Status: DC

## 2017-06-21 NOTE — ED Notes (Signed)
Pt had 2 albuterol treatments at home and 1 albuterol/atrovent treatment with EMS.

## 2017-06-21 NOTE — ED Triage Notes (Signed)
Per [facility] pt from home reports she had a coughing spell that triggered her asthma and is now having shortness of breath. Pt given 10 mg albuterol, 1mg  atrovent and 125 mg solumedrol en route by EMS. 24G in left hand.

## 2017-06-22 DIAGNOSIS — J4521 Mild intermittent asthma with (acute) exacerbation: Secondary | ICD-10-CM | POA: Diagnosis not present

## 2017-06-22 MED ORDER — PREDNISONE 20 MG PO TABS: 40.0000 mg | ORAL_TABLET | Freq: Every day | ORAL | 0 refills | Status: DC

## 2017-06-22 MED ORDER — IPRATROPIUM-ALBUTEROL 0.5-2.5 (3) MG/3ML IN SOLN
3.0000 mL | Freq: Once | RESPIRATORY_TRACT | Status: AC
  Administered 2017-06-22: 3 mL via RESPIRATORY_TRACT
  Filled 2017-06-22: qty 3
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
## 2017-06-22 NOTE — ED Notes (Signed)
Pt reports that she feels much better. MD made aware.

## 2017-06-25 DIAGNOSIS — L281 Prurigo nodularis: Secondary | ICD-10-CM | POA: Diagnosis not present

## 2017-07-23 DIAGNOSIS — L2081 Atopic neurodermatitis: Secondary | ICD-10-CM | POA: Diagnosis not present

## 2017-08-07 ENCOUNTER — Other Ambulatory Visit: Payer: Self-pay

## 2017-08-07 ENCOUNTER — Telehealth: Payer: Self-pay | Admitting: Internal Medicine

## 2017-08-07 MED ORDER — SITAGLIPTIN PHOSPHATE 100 MG PO TABS: 100.0000 mg | ORAL_TABLET | Freq: Every day | ORAL | 1 refills | Status: DC

## 2017-08-07 NOTE — Telephone Encounter (Signed)
Sent!

## 2017-08-07 NOTE — Telephone Encounter (Signed)
Pt called for a refill of her sitaGLIPtin (JANUVIA) 100 MG tablet  Please advise Please send to [facility] by mail

## 2017-08-27 ENCOUNTER — Other Ambulatory Visit: Payer: Self-pay

## 2017-08-27 DIAGNOSIS — D492 Neoplasm of unspecified behavior of bone, soft tissue, and skin: Secondary | ICD-10-CM | POA: Diagnosis not present

## 2017-08-27 DIAGNOSIS — L986 Other infiltrative disorders of the skin and subcutaneous tissue: Secondary | ICD-10-CM | POA: Diagnosis not present

## 2017-08-27 DIAGNOSIS — L281 Prurigo nodularis: Secondary | ICD-10-CM | POA: Diagnosis not present

## 2017-09-04 DIAGNOSIS — H2513 Age-related nuclear cataract, bilateral: Secondary | ICD-10-CM | POA: Diagnosis not present

## 2017-09-04 DIAGNOSIS — Z4802 Encounter for removal of sutures: Secondary | ICD-10-CM | POA: Diagnosis not present

## 2017-09-04 DIAGNOSIS — H40013 Open angle with borderline findings, low risk, bilateral: Secondary | ICD-10-CM | POA: Diagnosis not present

## 2017-09-04 DIAGNOSIS — H25013 Cortical age-related cataract, bilateral: Secondary | ICD-10-CM | POA: Diagnosis not present

## 2017-09-04 DIAGNOSIS — E119 Type 2 diabetes mellitus without complications: Secondary | ICD-10-CM | POA: Diagnosis not present

## 2017-09-14 ENCOUNTER — Encounter: Payer: Self-pay | Admitting: Internal Medicine

## 2017-09-14 ENCOUNTER — Ambulatory Visit (INDEPENDENT_AMBULATORY_CARE_PROVIDER_SITE_OTHER): Payer: Medicare Other | Admitting: Internal Medicine

## 2017-09-14 DIAGNOSIS — J4531 Mild persistent asthma with (acute) exacerbation: Secondary | ICD-10-CM | POA: Diagnosis not present

## 2017-09-14 MED ORDER — PREDNISONE 20 MG PO TABS: 40.0000 mg | ORAL_TABLET | Freq: Every day | ORAL | 0 refills | Status: DC

## 2017-09-14 MED ORDER — ONETOUCH ULTRA MINI W/DEVICE KIT: PACK | 0 refills | Status: AC

## 2017-09-14 MED ORDER — DOXYCYCLINE HYCLATE 100 MG PO TABS: 100.0000 mg | ORAL_TABLET | Freq: Two times a day (BID) | ORAL | 0 refills | Status: DC

## 2017-09-14 NOTE — Assessment & Plan Note (Signed)
Rx for prednisone and doxycycline. She will use albuterol nebulizer and continue symbicort and albuterol and allegra and flonase.

## 2017-09-14 NOTE — Patient Instructions (Signed)
We have sent in the new meter.   We have sent in prednisone. Take 2 pills daily for 5 days.  We have sent in doxycycline to take 1 pill twice a day for 1 week.

## 2017-09-14 NOTE — Progress Notes (Signed)
   Subjective:    [name], female    DOB: 06/01/1946, 71 y.o.   MRN: [id number]  HPI The patient is a 71 YO female coming in for cough. Going on for about 2 weeks now. She does have concurrent asthma which is worse. She is having wheezing and SOB. She is using nebulizer at least once per day which is not usual for her. Another flare of her asthma which resolved fully with prednisone in September. She denies fevers or chills. Coughing up white sputum. Nose drainage and sinus congestion. Denies pressure or headaches. She is still using flonase and allegra and symbicort without relief. Overall worsening over the last 2 weeks. Is having less appetite.   Review of Systems  Constitutional: Positive for activity change, appetite change and fatigue. Negative for chills, fever and unexpected weight change.  HENT: Positive for congestion, postnasal drip, rhinorrhea, sinus pressure, sore throat and voice change. Negative for ear discharge, ear pain, sinus pain, sneezing, tinnitus and trouble swallowing.   Eyes: Negative.   Respiratory: Positive for cough, shortness of breath and wheezing. Negative for chest tightness.   Cardiovascular: Negative.   Gastrointestinal: Negative.   Musculoskeletal: Positive for myalgias.  Neurological: Negative.       Objective:   Physical Exam  Constitutional: She is oriented to person, place, and time. She appears well-developed and well-nourished.  HENT:  Head: Normocephalic and atraumatic.  Oropharynx with redness and clear drainage, nose with swollen turbinates, TMs normal bilaterally  Eyes: EOM are normal.  Neck: Normal range of motion. No thyromegaly present.  Cardiovascular: Normal rate and regular rhythm.  Pulmonary/Chest: Effort normal. No respiratory distress. She has wheezes. She has no rales.  Abdominal: Soft.  Musculoskeletal: She exhibits tenderness.  Lymphadenopathy:    She has no cervical adenopathy.  Neurological: She is alert and  oriented to person, place, and time.  Skin: Skin is warm and dry.   Vitals:   09/14/17 1012  BP: 136/78  Pulse: (!) 55  Temp: 98.7 F (37.1 C)  TempSrc: Oral  SpO2: 98%  Weight: 175 lb (79.4 kg)  Height: 5\' 3"  (1.6 m)      Assessment & Plan:

## 2017-10-26 ENCOUNTER — Telehealth: Payer: Self-pay | Admitting: Internal Medicine

## 2017-10-26 MED ORDER — LOSARTAN POTASSIUM-HCTZ 100-25 MG PO TABS
1.0000 | ORAL_TABLET | Freq: Every day | ORAL | 3 refills | Status: DC
Start: 2017-10-26 — End: 2017-11-02

## 2017-10-26 NOTE — Telephone Encounter (Signed)
Copied from [place]. Topic: Quick Communication - See Telephone Encounter >> Oct 26, 2017 12:14 PM Bea Graff, NT wrote: CRM for notification. See Telephone encounter for: Pt states she received a letter from [facility] from [place] that they will be taking valsartan-hydrochlorothiazide off the shelf and that she needed to contact her PCP to get another medication ordered in place of it.   10/26/17.

## 2017-10-26 NOTE — Telephone Encounter (Signed)
Contacted and informed patient

## 2017-10-26 NOTE — Telephone Encounter (Signed)
We have put in an alternative losartan/hctz to take 1 pill daily instead of her current once she runs out of current medication.

## 2017-10-28 ENCOUNTER — Telehealth: Payer: Self-pay | Admitting: Internal Medicine

## 2017-10-28 NOTE — Telephone Encounter (Signed)
Patient will need refills for the following: Norvasc, Januvia, Lancets, glucose blood test strip, last OV 2017, has an appointment tomorrow.

## 2017-10-28 NOTE — Telephone Encounter (Signed)
Copied from [place] [phone]. Topic: Quick Communication - Rx Refill/Question >> Oct 28, 2017  1:26 PM Aurelio Brash B wrote: Medication: glipiZIDE (GLUCOTROL XL) 2.5 MG 24 hr tablet  amLODipine (NORVASC) 5 MG tablet  sitaGLIPtin (JANUVIA) 100 MG tablet  Lancets (ONETOUCH ULTRASOFT) lancets  glucose blood (ONE TOUCH ULTRA TEST) test strip   Has the patient contacted their pharmacy? Yes (No refills)   (Agent: If no, request that the patient contact the pharmacy for the refill.)   Preferred Pharmacy (with phone number or street name): [place], [place] [phone] (Phone) [phone] (Fax)     Agent: Please be advised that RX refills may take up to 3 business days. We ask that you follow-up with your pharmacy.

## 2017-10-29 ENCOUNTER — Encounter: Payer: Self-pay | Admitting: Internal Medicine

## 2017-10-29 ENCOUNTER — Ambulatory Visit (INDEPENDENT_AMBULATORY_CARE_PROVIDER_SITE_OTHER)
Admission: RE | Admit: 2017-10-29 | Discharge: 2017-10-29 | Disposition: A | Discharge status: Home / Self Care | Payer: Medicare Other | Source: Ambulatory Visit | Attending: Internal Medicine | Admitting: Internal Medicine

## 2017-10-29 ENCOUNTER — Ambulatory Visit (INDEPENDENT_AMBULATORY_CARE_PROVIDER_SITE_OTHER): Payer: Medicare Other | Admitting: Internal Medicine

## 2017-10-29 ENCOUNTER — Other Ambulatory Visit (INDEPENDENT_AMBULATORY_CARE_PROVIDER_SITE_OTHER): Payer: Medicare Other

## 2017-10-29 VITALS — BP 116/80 | HR 51 | Temp 98.5°F | Ht 63.0 in | Wt 178.0 lb

## 2017-10-29 DIAGNOSIS — R0602 Shortness of breath: Secondary | ICD-10-CM | POA: Diagnosis not present

## 2017-10-29 DIAGNOSIS — R059 Cough, unspecified: Secondary | ICD-10-CM

## 2017-10-29 DIAGNOSIS — J4531 Mild persistent asthma with (acute) exacerbation: Secondary | ICD-10-CM

## 2017-10-29 DIAGNOSIS — E1165 Type 2 diabetes mellitus with hyperglycemia: Secondary | ICD-10-CM | POA: Diagnosis not present

## 2017-10-29 DIAGNOSIS — I10 Essential (primary) hypertension: Secondary | ICD-10-CM | POA: Diagnosis not present

## 2017-10-29 DIAGNOSIS — IMO0002 Reserved for concepts with insufficient information to code with codable children: Secondary | ICD-10-CM

## 2017-10-29 DIAGNOSIS — E11319 Type 2 diabetes mellitus with unspecified diabetic retinopathy without macular edema: Secondary | ICD-10-CM | POA: Diagnosis not present

## 2017-10-29 DIAGNOSIS — R05 Cough: Secondary | ICD-10-CM

## 2017-10-29 LAB — COMPREHENSIVE METABOLIC PANEL
ALT: 17 U/L (ref 0–35)
AST: 17 U/L (ref 0–37)
Albumin: 4.4 g/dL (ref 3.5–5.2)
Alkaline Phosphatase: 66 U/L (ref 39–117)
BUN: 10 mg/dL (ref 6–23)
CHLORIDE: 100 meq/L (ref 96–112)
CO2: 33 meq/L — AB (ref 19–32)
CREATININE: 0.95 mg/dL (ref 0.40–1.20)
Calcium: 9.5 mg/dL (ref 8.4–10.5)
GFR: 74.59 mL/min (ref >60.00)
Glucose, Bld: 124 mg/dL — ABNORMAL HIGH (ref 70–99)
Potassium: 3 mEq/L — ABNORMAL LOW (ref 3.5–5.1)
SODIUM: 142 meq/L (ref 135–145)
Total Bilirubin: 0.7 mg/dL (ref 0.2–1.2)
Total Protein: 7.3 g/dL (ref 6.0–8.3)

## 2017-10-29 LAB — HEMOGLOBIN A1C: Hgb A1c MFr Bld: 7 % — ABNORMAL HIGH (ref 4.6–6.5)

## 2017-10-29 LAB — CBC
HCT: 41.5 % (ref 36.0–46.0)
Hemoglobin: 13.6 g/dL (ref 12.0–15.0)
MCHC: 32.6 g/dL (ref 30.0–36.0)
MCV: 85.9 fl (ref 78.0–100.0)
Platelets: 252 10*3/uL (ref 150.0–400.0)
RBC: 4.83 Mil/uL (ref 3.87–5.11)
RDW: 14.3 % (ref 11.5–15.5)
WBC: 10 10*3/uL (ref 4.0–10.5)

## 2017-10-29 LAB — TSH: TSH: 1.26 u[IU]/mL (ref 0.35–4.50)

## 2017-10-29 MED ORDER — SITAGLIPTIN PHOSPHATE 100 MG PO TABS: 100.0000 mg | ORAL_TABLET | Freq: Every day | ORAL | 1 refills | Status: DC

## 2017-10-29 MED ORDER — GLUCOSE BLOOD VI STRP: 1.0000 | ORAL_STRIP | Freq: Two times a day (BID) | 3 refills | Status: AC

## 2017-10-29 MED ORDER — IPRATROPIUM-ALBUTEROL 0.5-2.5 (3) MG/3ML IN SOLN
3.0000 mL | Freq: Once | RESPIRATORY_TRACT | Status: AC
  Administered 2017-10-29: 3 mL via RESPIRATORY_TRACT

## 2017-10-29 MED ORDER — DOXYCYCLINE HYCLATE 100 MG PO TABS: 100.0000 mg | ORAL_TABLET | Freq: Two times a day (BID) | ORAL | 0 refills | Status: DC

## 2017-10-29 MED ORDER — GLIPIZIDE ER 2.5 MG PO TB24: ORAL_TABLET | ORAL | 3 refills | Status: DC

## 2017-10-29 MED ORDER — PREDNISONE 20 MG PO TABS: 40.0000 mg | ORAL_TABLET | Freq: Every day | ORAL | 0 refills | Status: DC

## 2017-10-29 MED ORDER — AMLODIPINE BESYLATE 5 MG PO TABS: 5.0000 mg | ORAL_TABLET | Freq: Every day | ORAL | 3 refills | Status: DC

## 2017-10-29 MED ORDER — ONETOUCH ULTRASOFT LANCETS MISC: 2 refills | Status: AC

## 2017-10-29 NOTE — Patient Instructions (Addendum)
We will try to change the breathing inhaler symbicort to another one called breo. We have given you a sample of this today. Take 1 puff daily every day even if you feel well. Stop taking symbicort.Call us if this works better and we will send in the prescription for you.   Keep using the nebulizer 2-3 times per day as needed for breathing. The next week or so I would recommend to do this on a schedule 3 times per day to keep the lungs open.   We have sent in prednisone. Take 2 pills daily for 1 week. We have also sent in doxycycline which is the antibiotic to take 1 pill twice a day for 1 week.   We are checking the chest x-ray today and will call you about the results.

## 2017-10-29 NOTE — Telephone Encounter (Signed)
Done at visit

## 2017-10-30 ENCOUNTER — Encounter: Payer: Self-pay | Admitting: Internal Medicine

## 2017-10-30 NOTE — Progress Notes (Signed)
   Subjective:    [name], female    DOB: December 22, 1945, 72 y.o.   MRN: [id number]  HPI The patient is a 72 YO female coming in for cough going on for about 1 month now. She is coughing up some white sputum. She is getting SOB doing mild activity. She has been taking symbicort but it does not seem to help. Using albuterol some. Denies fevers or chills. Denies nasal congestion. Taking otc cough medicine which is not helpful. Overall cough and SOB is getting worse.   Review of Systems  Constitutional: Positive for activity change and appetite change. Negative for chills, fatigue, fever and unexpected weight change.  HENT: Positive for postnasal drip. Negative for congestion, ear discharge, ear pain, rhinorrhea, sinus pressure, sinus pain, sneezing, sore throat, tinnitus, trouble swallowing and voice change.   Eyes: Negative.   Respiratory: Positive for cough, shortness of breath and wheezing. Negative for chest tightness.   Cardiovascular: Negative.   Gastrointestinal: Negative.   Neurological: Negative.       Objective:   Physical Exam  Constitutional: She is oriented to person, place, and time. She appears well-developed and well-nourished.  HENT:  Head: Normocephalic and atraumatic.  Oropharynx with redness and clear drainage,TMs normal bilaterally  Eyes: EOM are normal.  Neck: Normal range of motion. No thyromegaly present.  Cardiovascular: Normal rate and regular rhythm.  Pulmonary/Chest: Effort normal. No respiratory distress. She has wheezes. She has no rales.  Prior to nebulizer treatment diffuse wheezing with rhonchi and after treatment minimal wheezing and rhonchi  Abdominal: Soft.  Musculoskeletal: She exhibits tenderness.  Lymphadenopathy:    She has no cervical adenopathy.  Neurological: She is alert and oriented to person, place, and time.  Skin: Skin is warm and dry.   Vitals:   10/29/17 1120  BP: 116/80  Pulse: (!) 51  Temp: 98.5 F (36.9 C)    TempSrc: Oral  SpO2: 99%  Weight: 178 lb (80.7 kg)  Height: 5\' 3"  (1.6 m)      Assessment & Plan:  Duo neb given at visit  Breo 200/25 sample given to patient at visit

## 2017-10-30 NOTE — Assessment & Plan Note (Addendum)
Will change symicort to breo to see if this is easier to use and more helpful. She will use albuterol prn for SOB. Rx for doxycycline and prednisone given today and checking CXR given duration of symptoms. Given duoneb during the visit today with good improvement of lung exam. Sample of breo given today and if helpful will prescribe.

## 2017-11-02 ENCOUNTER — Other Ambulatory Visit: Payer: Self-pay | Admitting: Internal Medicine

## 2017-11-02 MED ORDER — LOSARTAN POTASSIUM-HCTZ 100-12.5 MG PO TABS
1.0000 | ORAL_TABLET | Freq: Every day | ORAL | 3 refills | Status: DC
Start: 2017-11-02 — End: 2019-02-02

## 2017-11-03 ENCOUNTER — Ambulatory Visit (INDEPENDENT_AMBULATORY_CARE_PROVIDER_SITE_OTHER): Payer: Medicare Other | Admitting: *Deleted

## 2017-11-03 VITALS — BP 152/68 | HR 54 | Resp 20 | Ht 63.0 in | Wt 179.0 lb

## 2017-11-03 DIAGNOSIS — Z0000 Encounter for general adult medical examination without abnormal findings: Secondary | ICD-10-CM

## 2017-11-03 DIAGNOSIS — E1165 Type 2 diabetes mellitus with hyperglycemia: Secondary | ICD-10-CM

## 2017-11-03 DIAGNOSIS — E11319 Type 2 diabetes mellitus with unspecified diabetic retinopathy without macular edema: Secondary | ICD-10-CM

## 2017-11-03 DIAGNOSIS — IMO0002 Reserved for concepts with insufficient information to code with codable children: Secondary | ICD-10-CM

## 2017-11-03 NOTE — Progress Notes (Signed)
Medical screening examination/treatment/procedure(s) were performed by non-physician practitioner and as supervising physician I was immediately available for consultation/collaboration. I agree with above. Afia Messenger A Braulio Kiedrowski, MD 
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
## 2017-11-03 NOTE — Patient Instructions (Addendum)
Continue doing brain stimulating activities (puzzles, reading, adult coloring books, staying active) to keep memory sharp.   Continue to eat heart healthy diet (full of fruits, vegetables, whole grains, lean protein, water--limit salt, fat, and sugar intake) and increase physical activity as tolerated.  Please ask GYN doctor to send results of mammogram and your eye doctor to send results of your eye exam to Dr. Sharlet Salina.    Pamela Morgan , Thank you for taking time to come for your Medicare Wellness Visit. I appreciate your ongoing commitment to your health goals. Please review the following plan we discussed and let me know if I can assist you in the future.   These are the goals we discussed: Goals    . Patient Stated     Continue to eat healthy, exercise and stay as independent as possible. Enjoy life and family.       This is a list of the screening recommended for you and due dates:  Health Maintenance  Topic Date Due  . Mammogram  04/24/2017  . Complete foot exam   07/17/2017  . Eye exam for diabetics  09/01/2017  . Colon Cancer Screening  12/19/2017  . Hemoglobin A1C  04/28/2018  . Tetanus Vaccine  10/20/2020  . DEXA scan (bone density measurement)  Completed  .  Hepatitis C: One time screening is recommended by Center for Disease Control  (CDC) for  adults born from [birth year] through 1964.   Completed  . Pneumonia vaccines  Completed

## 2017-11-09 ENCOUNTER — Telehealth: Payer: Self-pay | Admitting: Internal Medicine

## 2017-11-09 MED ORDER — FLUTICASONE FUROATE-VILANTEROL 200-25 MCG/INH IN AEPB: 1.0000 | INHALATION_SPRAY | Freq: Every day | RESPIRATORY_TRACT | 3 refills | Status: DC

## 2017-11-09 NOTE — Telephone Encounter (Signed)
Patient wants Rx- will be new Rx for her

## 2017-11-09 NOTE — Telephone Encounter (Signed)
Copied from [place] [id number]. Topic: Quick Communication - Rx Refill/Question >> Nov 09, 2017 11:32 AM Scherrie Gerlach wrote: Medication: fluticasone furoate-vilanterol (BREO ELLIPTA) 200-25 MCG/INH AEPB Pt states she was to try this before getting a mailorder supply. Pt states this works well and would like a new 90 day Rx sent to   [facility], [place] [phone] (Phone) [phone] (Fax)

## 2017-11-09 NOTE — Telephone Encounter (Signed)
Routing message to Dr. Sharlet Salina to advise.

## 2017-11-09 NOTE — Telephone Encounter (Signed)
Rx sent in

## 2017-11-17 ENCOUNTER — Telehealth: Payer: Self-pay | Admitting: Internal Medicine

## 2017-11-17 NOTE — Telephone Encounter (Signed)
Can we give patient samples

## 2017-11-17 NOTE — Telephone Encounter (Signed)
Copied from [place] [id number]. Topic: General - Other >> Nov 17, 2017  8:58 AM Lolita Rieger, RMA wrote: Reason for CRM: pt daughter called and stated that pt [facility] has not arrived and that she is out of her medication and would like to know if there is a sample or if one can be called into her local pharmacy Please contact [place] [phone]

## 2017-11-17 NOTE — Telephone Encounter (Signed)
Samples placed up front and daughter aware

## 2018-02-01 ENCOUNTER — Encounter: Payer: Self-pay | Admitting: Internal Medicine

## 2018-02-01 ENCOUNTER — Ambulatory Visit (INDEPENDENT_AMBULATORY_CARE_PROVIDER_SITE_OTHER): Payer: Medicare Other | Admitting: Internal Medicine

## 2018-02-01 VITALS — BP 130/80 | HR 48 | Temp 98.3°F | Ht 63.0 in | Wt 181.0 lb

## 2018-02-01 DIAGNOSIS — R350 Frequency of micturition: Secondary | ICD-10-CM

## 2018-02-01 DIAGNOSIS — R21 Rash and other nonspecific skin eruption: Secondary | ICD-10-CM | POA: Diagnosis not present

## 2018-02-01 LAB — POCT URINALYSIS DIPSTICK
Bilirubin, UA: NEGATIVE
Glucose, UA: NEGATIVE
Ketones, UA: NEGATIVE
LEUKOCYTES UA: NEGATIVE
NITRITE UA: NEGATIVE
PH UA: 6 (ref 5.0–8.0)
Protein, UA: NEGATIVE
RBC UA: NEGATIVE
SPEC GRAV UA: 1.01 (ref 1.010–1.025)
UROBILINOGEN UA: 0.2 U/dL

## 2018-02-01 MED ORDER — ACYCLOVIR 5 % EX OINT: 1.0000 "application " | TOPICAL_OINTMENT | CUTANEOUS | 0 refills | Status: AC

## 2018-02-01 NOTE — Patient Instructions (Signed)
We have sent in the ointment to use on the sore four times a day.   You do not have a urine infection so it likely a muscle. You can use heat on it or tylenol for pain.

## 2018-02-01 NOTE — Progress Notes (Signed)
   Subjective:    [name], female    DOB: 1946/02/04, 72 y.o.   MRN: [id number]  HPI The patient is a 72 YO female coming in for several concerns including blister (on her lip, denies pain, some swelling, no discharge, denies injury, denies prior blisters there, overall stable to mild worsening, started 3-4 days ago), and her new problem of suprapubic pain (doing some more household work in the last several weeks, hurts worst with lying down, denies dysuria but having more frequency in the last 2 days, started about 2 weeks ago, denies fevers or chills, no diarrhea or constipation, no nausea or vomiting).   Review of Systems  Constitutional: Negative.   HENT: Negative.   Eyes: Negative.   Respiratory: Negative for cough, chest tightness and shortness of breath.   Cardiovascular: Negative for chest pain, palpitations and leg swelling.  Gastrointestinal: Positive for abdominal pain. Negative for abdominal distention, constipation, diarrhea, nausea and vomiting.  Genitourinary: Positive for frequency and urgency. Negative for dysuria, enuresis, hematuria, menstrual problem, vaginal bleeding, vaginal discharge and vaginal pain.  Musculoskeletal: Negative.   Skin: Positive for wound.  Neurological: Negative.   Psychiatric/Behavioral: Negative.       Objective:   Physical Exam  Constitutional: She is oriented to person, place, and time. She appears well-developed and well-nourished.  HENT:  Head: Normocephalic and atraumatic.  Lesion on the right upper lip which could be herpetic lesion  Eyes: EOM are normal.  Neck: Normal range of motion.  Cardiovascular: Normal rate and regular rhythm.  Pulmonary/Chest: Effort normal and breath sounds normal. No respiratory distress. She has no wheezes. She has no rales.  Abdominal: Soft. Bowel sounds are normal. She exhibits no distension. There is tenderness. There is no rebound.  Mild suprapubic tenderness, no radiation or back pain    Musculoskeletal: She exhibits no edema.  Neurological: She is alert and oriented to person, place, and time. Coordination normal.  Skin: Skin is warm and dry.  Psychiatric: She has a normal mood and affect.   Vitals:   02/01/18 1449  BP: 130/80  Pulse: (!) 48  Temp: 98.3 F (36.8 C)  TempSrc: Oral  SpO2: 97%  Weight: 181 lb (82.1 kg)  Height: 5\' 3"  (1.6 m)      Assessment & Plan:

## 2018-02-02 ENCOUNTER — Encounter: Payer: Self-pay | Admitting: Internal Medicine

## 2018-02-02 DIAGNOSIS — R350 Frequency of micturition: Secondary | ICD-10-CM | POA: Insufficient documentation

## 2018-02-02 NOTE — Assessment & Plan Note (Signed)
Acyclovir ointment prescribed for the lesion which could be a cold sore. Does not have the pain but appearance is compatible.

## 2018-02-02 NOTE — Assessment & Plan Note (Signed)
POC U/A done in the office which is negative for signs of infection. Advised this is likely muscular and will take tylenol as needed for pain. Use heating pad if needed.

## 2018-03-30 ENCOUNTER — Encounter: Payer: Self-pay | Admitting: Gynecology

## 2018-03-30 ENCOUNTER — Ambulatory Visit (INDEPENDENT_AMBULATORY_CARE_PROVIDER_SITE_OTHER): Payer: Medicare Other | Admitting: Gynecology

## 2018-03-30 VITALS — BP 150/76 | Ht 63.0 in | Wt 181.0 lb

## 2018-03-30 DIAGNOSIS — Z01419 Encounter for gynecological examination (general) (routine) without abnormal findings: Secondary | ICD-10-CM | POA: Diagnosis not present

## 2018-03-30 DIAGNOSIS — N952 Postmenopausal atrophic vaginitis: Secondary | ICD-10-CM

## 2018-03-30 DIAGNOSIS — R21 Rash and other nonspecific skin eruption: Secondary | ICD-10-CM

## 2018-03-30 MED ORDER — CLOBETASOL PROPIONATE 0.05 % EX CREA: TOPICAL_CREAM | CUTANEOUS | 1 refills | Status: DC

## 2018-03-30 NOTE — Progress Notes (Signed)
    Pamela Morgan [birth year] [id number]        72 y.o.  G2P2 for breast and pelvic exam.  History of skin rash left buttocks biopsied last year.  Using clobetasol cream intermittently with good results.  Past medical history,surgical history, problem list, medications, allergies, family history and social history were all reviewed and documented as reviewed in the EPIC chart.  ROS:  Performed with pertinent positives and negatives included in the history, assessment and plan.   Additional significant findings : None   Exam: [name] assistant Vitals:   03/30/18 1020  BP: (!) 150/76  Weight: 181 lb (82.1 kg)  Height: 5\' 3"  (1.6 m)   Body mass index is 32.06 kg/m.  General appearance:  Normal affect, orientation and appearance. Skin: Grossly normal HEENT: Without gross lesions.  No cervical or supraclavicular adenopathy. Thyroid normal.  Lungs:  Clear without wheezing, rales or rhonchi Cardiac: RR, without RMG Abdominal:  Soft, nontender, without masses, guarding, rebound, organomegaly or hernia Breasts:  Examined lying and sitting without masses, retractions, discharge or axillary adenopathy. Pelvic:  Ext, BUS, Vagina: With atrophic changes.  Faint rash-like area left buttocks significantly improved from prior exam.  Adnexa: Without masses or tenderness    Anus and perineum: Normal   Rectovaginal: Normal sphincter tone without palpated masses or tenderness.    Assessment/Plan:  72 y.o. G2P2 female for breast and pelvic exam, status post TAH/RSO in the past for bleeding.   1. Skin rash left buttocks.  Much improved from prior exam.  Almost gone.  Uses clobetasol intermittently.  Refill provided 30 g for patient. 2. Postmenopausal/atrophic genital changes.  No significant hot flushes, night sweats or vaginal dryness. 3. Mammogram overdue.  Never followed up as recommended last year.  I again stressed the need to schedule a screening mammogram.  Most common cancer in women  reviewed.  Patient agrees to do so.  Names and numbers provided.  Breast exam normal today. 4. Colonoscopy due now and patient is going to call and schedule.  Names and numbers provided. 5. DEXA 2015 normal.  Plan repeat DEXA next year at 5-year interval. 6. Pap smear 2012.  No Pap smear done today.  No history of abnormal Pap smears.  We both agreed to stop screening based on current screening guidelines and age and hysterectomy history. 7. Health maintenance.  Blood pressure 150/76 reviewed.  Recommended to recheck in a non-exam situation.  To follow-up with her primary if remains elevated.  Otherwise no routine blood work done as patient does this elsewhere.  Follow-up 1 year, sooner as needed.   Anastasio Auerbach MD, 10:46 AM 03/30/2018

## 2018-03-30 NOTE — Patient Instructions (Signed)
Schedule your colonoscopy with either:  [facility]   Address: [address], [place], [place] [address]  [phone]    or  [facility]  Address: [address], [place], [place] [address]  [phone]       Call to Schedule your mammogram  Facilities in [place]: 1)  [facility]. [facility], [address] Phone: [phone] 2)  Dr. Bertrand at [facility]  [address] Phone: [phone]     Mammogram A mammogram is an X-ray test to find changes in a woman's breast. You should get a mammogram if:  You are 40 years of age or older  You have risk factors.   Your doctor recommends that you have one.  BEFORE THE TEST  Do not schedule the test the week before your period, especially if your breasts are sore during this time.  On the day of your mammogram:  Wash your breasts and armpits well. After washing, do not put on any deodorant or talcum powder on until after your test.   Eat and drink as you usually do.   Take your medicines as usual.   If you are diabetic and take insulin, make sure you:   Eat before coming for your test.   Take your insulin as usual.   If you cannot keep your appointment, call before the appointment to cancel. Schedule another appointment.  TEST  You will need to undress from the waist up. You will put on a hospital gown.   Your breast will be put on the mammogram machine, and it will press firmly on your breast with a piece of plastic called a compression paddle. This will make your breast flatter so that the machine can X-ray all parts of your breast.   Both breasts will be X-rayed. Each breast will be X-rayed from above and from the side. An X-ray might need to be taken again if the picture is not good enough.   The mammogram will last about 15 to 30 minutes.  AFTER THE TEST Finding out the results of your test Ask when  your test results will be ready. Make sure you get your test results.  Document Released: 01/02/2009 Document Revised: 09/25/2011 Document Reviewed: 01/02/2009 Exit[employer] Patient Information 2012 [employer].   

## 2018-05-04 ENCOUNTER — Ambulatory Visit (INDEPENDENT_AMBULATORY_CARE_PROVIDER_SITE_OTHER): Payer: Medicare Other | Admitting: Internal Medicine

## 2018-05-04 ENCOUNTER — Ambulatory Visit: Payer: Self-pay | Admitting: *Deleted

## 2018-05-04 ENCOUNTER — Encounter: Payer: Self-pay | Admitting: Internal Medicine

## 2018-05-04 VITALS — BP 130/70 | HR 47 | Temp 98.7°F | Ht 63.0 in | Wt 180.0 lb

## 2018-05-04 DIAGNOSIS — R21 Rash and other nonspecific skin eruption: Secondary | ICD-10-CM

## 2018-05-04 DIAGNOSIS — M545 Low back pain, unspecified: Secondary | ICD-10-CM

## 2018-05-04 DIAGNOSIS — I10 Essential (primary) hypertension: Secondary | ICD-10-CM

## 2018-05-04 MED ORDER — TRIAMCINOLONE ACETONIDE 0.1 % EX CREA: 1.0000 "application " | TOPICAL_CREAM | Freq: Two times a day (BID) | CUTANEOUS | 0 refills | Status: DC

## 2018-05-04 MED ORDER — METHYLPREDNISOLONE ACETATE 40 MG/ML IJ SUSP
40.0000 mg | Freq: Once | INTRAMUSCULAR | Status: AC
  Administered 2018-05-04: 40 mg via INTRAMUSCULAR

## 2018-05-04 NOTE — Assessment & Plan Note (Signed)
Advised that likely the antiperspirant part of the deodorant was causing her rash and she is given triamcinolone cream and encouraged to use only deodorant in the future and stop using current product.

## 2018-05-04 NOTE — Telephone Encounter (Signed)
Noted  

## 2018-05-04 NOTE — Telephone Encounter (Signed)
*  Allergies that are causing her to have trouble breathing. Patient has used breathing treatment and she is better now.  *Patient also used deodorant that caused her to have skin reaction. Patient is having itching and burning under arms- she has not changed her deodorant. She is miserable and request appointment - scheduled for evaluation of skin.  Reason for Disposition . [1] MODERATE-SEVERE local itching (i.e., interferes with work, school, activities) AND [2] not improved after 24 hours of hydrocortisone cream  Answer Assessment - Initial Assessment Questions 1. DESCRIPTION: "Describe the itching you are having." "Where is it located?"     Under both arm- arm pit area 2. SEVERITY: "How bad is it?"    - MILD - doesn't interfere with normal activities   - MODERATE - SEVERE: interferes with work, school, sleep, or other activities      Severe- with burning 3. SCRATCHING: "Are there any scratch marks? Bleeding?"     scratching 4. ONSET: "When did the itching begin?"      2 weeks skin color changed and itching started  5. CAUSE: "What do you think is causing the itching?"      Yeast may be present 6. OTHER SYMPTOMS: "Do you have any other symptoms?"      Itching, burning, change in skin color 7. PREGNANCY: "Is there any chance you are pregnant?" "When was your last menstrual period?"     n/a  Protocols used: ITCHING - LOCALIZED-A-AH

## 2018-05-04 NOTE — Progress Notes (Signed)
   Subjective:    [name], female    DOB: 1946-04-30, 72 y.o.   MRN: [id number]  HPI The patient is a 72 YO female coming in for several concerns including: itching under her arms (used new deodorant in the last several weeks, some red rash, no abscess or draining, she has continued to use the product, she denies prior bad reaction with products, is not using anything for it), and low back pain (started about 1-2 days ago, denies falls or injury or overuse, has not taken anything for it, pain with sitting or standing for a long time, 8/10 pain when bad, denies pain down her legs or numbness) as well as follow up of her blood pressure (with feeling bad the last several days she wondered if her blood pressure was up, denies headaches or chest pains, taking her amlodipine and losartan/hctz).    Review of Systems  Constitutional: Positive for activity change. Negative for appetite change, chills, fatigue, fever and unexpected weight change.  HENT: Negative.   Eyes: Negative.   Respiratory: Negative.  Negative for cough, chest tightness and shortness of breath.   Cardiovascular: Negative.  Negative for chest pain, palpitations and leg swelling.  Gastrointestinal: Negative.  Negative for abdominal distention, abdominal pain, constipation, diarrhea, nausea and vomiting.  Musculoskeletal: Positive for back pain and myalgias. Negative for arthralgias, gait problem and joint swelling.  Skin: Positive for color change and rash.  Neurological: Negative.   Psychiatric/Behavioral: Negative.       Objective:   Physical Exam  Constitutional: She is oriented to person, place, and time. She appears well-developed and well-nourished.  HENT:  Head: Normocephalic and atraumatic.  Eyes: EOM are normal.  Neck: Normal range of motion.  Cardiovascular: Normal rate and regular rhythm.  Pulmonary/Chest: Effort normal and breath sounds normal. No respiratory distress. She has no wheezes. She has no  rales.  Abdominal: Soft. Bowel sounds are normal. She exhibits no distension. There is no tenderness. There is no rebound.  Musculoskeletal: She exhibits no edema.  Pain in the lumbar region in a band across L4-L5 region with some SI joint tenderness as well  Neurological: She is alert and oriented to person, place, and time. Coordination normal.  Skin: Skin is warm and dry. Rash noted.  Bilateral underarms with redness and rash with some stigmata of scratching.   Psychiatric: She has a normal mood and affect.   Vitals:   05/04/18 1051  BP: 130/70  Pulse: (!) 47  Temp: 98.7 F (37.1 C)  TempSrc: Oral  SpO2: 97%  Weight: 180 lb (81.6 kg)  Height: 5\' 3"  (1.6 m)      Assessment & Plan:

## 2018-05-04 NOTE — Patient Instructions (Signed)
We have given you a shot which should help the back heal. It is okay to use tylenol or a heating pad for the pain. If your back if not feeling better in 1-2 weeks call us back.  We have sent in cream to use on the armpits. Stop the deodorant you are using. Find one without antiperspirant in it. You may have to look in the natural section or in the men's section.

## 2018-05-04 NOTE — Assessment & Plan Note (Signed)
Given depo-medrol 40 mg IM today and advised to use tylenol for pain. If no resolution in 2 weeks need to update x-ray. Last lumbar x-ray from about 3 years ago with some signs of arthritis.

## 2018-05-04 NOTE — Assessment & Plan Note (Signed)
BP at goal on losartan/hctz and amlodipine.

## 2018-06-25 ENCOUNTER — Encounter: Payer: Self-pay | Admitting: Internal Medicine

## 2018-06-25 ENCOUNTER — Ambulatory Visit (INDEPENDENT_AMBULATORY_CARE_PROVIDER_SITE_OTHER): Payer: Medicare Other | Admitting: Internal Medicine

## 2018-06-25 VITALS — BP 164/76 | HR 52 | Temp 98.3°F | Resp 18 | Ht 63.0 in | Wt 185.1 lb

## 2018-06-25 DIAGNOSIS — S80862A Insect bite (nonvenomous), left lower leg, initial encounter: Secondary | ICD-10-CM

## 2018-06-25 DIAGNOSIS — L03116 Cellulitis of left lower limb: Secondary | ICD-10-CM

## 2018-06-25 DIAGNOSIS — W57XXXA Bitten or stung by nonvenomous insect and other nonvenomous arthropods, initial encounter: Secondary | ICD-10-CM

## 2018-06-25 MED ORDER — DOXYCYCLINE HYCLATE 100 MG PO TABS
100.0000 mg | ORAL_TABLET | Freq: Two times a day (BID) | ORAL | 0 refills | Status: DC
Start: 2018-06-25 — End: 2018-09-20

## 2018-06-25 MED ORDER — METHYLPREDNISOLONE 4 MG PO TBPK: ORAL_TABLET | ORAL | 0 refills | Status: DC

## 2018-06-25 NOTE — Assessment & Plan Note (Signed)
2 days ago she was bitten by something - felt a bite but is unsure what it was Erythema, swelling and warm Medrol dose pak - discussed this may elevate her sugars Apply ice, apply benadryl cream
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
## 2018-06-25 NOTE — Assessment & Plan Note (Signed)
Symptoms and exam consistent with cellulitis  Start doxycycline  Monitor closely Expect improvement over the next #1-2 days-if symptoms worsen, especially after 2 days she needs to be reevaluated

## 2018-06-25 NOTE — Patient Instructions (Addendum)
Take the antibiotic and steroid as prescribed.   Continue your allegra.   Apply ice, apply benadryl cream and elevate your leg when sitting.     Insect Bite, Adult An insect bite can make your skin red, itchy, and swollen. An insect bite is different from an insect sting, which happens when an insect injects poison (venom) into the skin. Some insects can spread disease to people through a bite. However, most insect bites do not lead to disease and are not serious. What are the causes? Insects may bite for a variety of reasons, including:  Hunger.  To defend themselves.  Insects that bite include:  Spiders.  Mosquitoes.  Ticks.  Fleas.  Ants.  Flies.  Bedbugs.  What are the signs or symptoms? Symptoms of this condition include:  Itching or pain in the bite area.  Redness and swelling in the bite area.  An open wound (skin ulcer).  In many cases, symptoms last for 2-4 days. How is this diagnosed? This condition is usually diagnosed based on symptoms and a physical exam. How is this treated? Treatment is usually not needed. Symptoms often go away on their own. When treatment is recommended, it may involve:  Applying a cream or lotion to the bitten area. This treatment helps with itching.  Taking an antibiotic medicine. This treatment is needed if the bite area gets infected.  Getting a tetanus shot.  Applying ice to the affected area.  Medicines called antihistamines. This treatment is needed if you develop an allergic reaction to the insect bite.  Follow these instructions at home: Bite area care  Do not scratch the bite area.  Keep the bite area clean and dry. Wash it every day with soap and water as told by your health care provider.  Check the bite area every day for signs of infection. Check for: ? More redness, swelling, or pain. ? Fluid or blood. ? Warmth. ? Pus. Managing pain, itching, and swelling   You may apply a baking soda paste,  cortisone cream, or calamine lotion to the bite area as told by your health care provider.  If directed, applyice to the bite area. ? Put ice in a plastic bag. ? Place a towel between your skin and the bag. ? Leave the ice on for 20 minutes, 2-3 times per day. Medicines  Apply or take over-the-counter and prescription medicines only as told by your health care provider.  If you were prescribed an antibiotic medicine, use it as told by your health care provider. Do not stop using the antibiotic even if your condition improves. General instructions  Keep all follow-up visits as told by your health care provider. This is important. How is this prevented? To help reduce your risk of insect bites:  When you are outdoors, wear clothing that covers your arms and legs.  Use insect repellent. The best insect repellents contain: ? DEET, picaridin, oil of lemon eucalyptus (OLE), or IR3535. ? Higher amounts of an active ingredient.  If your home windows do not have screens, consider installing them.  Contact a health care provider if:  You have more redness, swelling, or pain in the bite area.  You have fluid, blood, or pus coming from the bite area.  The bite area feels warm to the touch.  You have a fever. Get help right away if:  You have joint pain.  You have a rash.  You have shortness of breath.  You feel unusually tired or sleepy.  You  have neck pain.  You have a headache.  You have unusual weakness.  You have chest pain.  You have nausea, vomiting, or pain in the abdomen. This information is not intended to replace advice given to you by your health care provider. Make sure you discuss any questions you have with your health care provider. Document Released: 11/13/2004 Document Revised: 06/04/2016 Document Reviewed: 04/14/2016 Elsevier Interactive Patient Education  [employer].

## 2018-09-09 DIAGNOSIS — H2513 Age-related nuclear cataract, bilateral: Secondary | ICD-10-CM | POA: Diagnosis not present

## 2018-09-09 DIAGNOSIS — H40013 Open angle with borderline findings, low risk, bilateral: Secondary | ICD-10-CM | POA: Diagnosis not present

## 2018-09-09 DIAGNOSIS — E119 Type 2 diabetes mellitus without complications: Secondary | ICD-10-CM | POA: Diagnosis not present

## 2018-09-09 DIAGNOSIS — H35363 Drusen (degenerative) of macula, bilateral: Secondary | ICD-10-CM | POA: Diagnosis not present

## 2018-09-09 LAB — HM DIABETES EYE EXAM

## 2018-09-10 ENCOUNTER — Encounter: Payer: Self-pay | Admitting: Internal Medicine

## 2018-09-10 NOTE — Progress Notes (Signed)
Abstracted and sent to scan  

## 2018-09-17 ENCOUNTER — Ambulatory Visit: Payer: Self-pay

## 2018-09-17 NOTE — Telephone Encounter (Signed)
  Pt.'s daughter reports pt. Started coughing this past Tuesday. Is coughing so hard, she vomits. Is having wheezing and shortness of breath. Has a history of asthma and is using her nebulizer. Instructed to go to ED for evaluation. Verbalizes understanding. Reason for Disposition . Difficulty breathing  Answer Assessment - Initial Assessment Questions 1. ONSET: "When did the cough begin?"      Started Tuesday 2. SEVERITY: "How bad is the cough today?"      Severe 3. RESPIRATORY DISTRESS: "Describe your breathing."      Shortness 4. FEVER: "Do you have a fever?" If so, ask: "What is your temperature, how was it measured, and when did it start?"     No 5. SPUTUM: "Describe the color of your sputum" (clear, white, yellow, green)     Clear 6. HEMOPTYSIS: "Are you coughing up any blood?" If so ask: "How much?" (flecks, streaks, tablespoons, etc.)     No 7. CARDIAC HISTORY: "Do you have any history of heart disease?" (e.g., heart attack, congestive heart failure)      HTN 8. LUNG HISTORY: "Do you have any history of lung disease?"  (e.g., pulmonary embolus, asthma, emphysema)     Asthma 9. PE RISK FACTORS: "Do you have a history of blood clots?" (or: recent major surgery, recent prolonged travel, bedridden)     No 10. OTHER SYMPTOMS: "Do you have any other symptoms?" (e.g., runny nose, wheezing, chest pain)       Wheezing and shortness of breath 11. PREGNANCY: "Is there any chance you are pregnant?" "When was your last menstrual period?"       No 12. TRAVEL: "Have you traveled out of the country in the last month?" (e.g., travel history, exposures)       No  Protocols used: [place]

## 2018-09-20 ENCOUNTER — Other Ambulatory Visit (INDEPENDENT_AMBULATORY_CARE_PROVIDER_SITE_OTHER): Payer: Medicare Other

## 2018-09-20 ENCOUNTER — Ambulatory Visit (INDEPENDENT_AMBULATORY_CARE_PROVIDER_SITE_OTHER): Payer: Medicare Other | Admitting: Family

## 2018-09-20 ENCOUNTER — Encounter: Payer: Self-pay | Admitting: Family

## 2018-09-20 VITALS — BP 142/82 | HR 60 | Temp 98.3°F | Ht 63.0 in | Wt 181.0 lb

## 2018-09-20 DIAGNOSIS — E11319 Type 2 diabetes mellitus with unspecified diabetic retinopathy without macular edema: Secondary | ICD-10-CM

## 2018-09-20 DIAGNOSIS — IMO0002 Reserved for concepts with insufficient information to code with codable children: Secondary | ICD-10-CM

## 2018-09-20 DIAGNOSIS — E1165 Type 2 diabetes mellitus with hyperglycemia: Secondary | ICD-10-CM | POA: Diagnosis not present

## 2018-09-20 DIAGNOSIS — J4531 Mild persistent asthma with (acute) exacerbation: Secondary | ICD-10-CM | POA: Diagnosis not present

## 2018-09-20 LAB — LIPID PANEL
CHOL/HDL RATIO: 4
Cholesterol: 212 mg/dL — ABNORMAL HIGH (ref 0–200)
HDL: 50.6 mg/dL (ref >39.00)
LDL CALC: 137 mg/dL — AB (ref 0–99)
NONHDL: 161.09
TRIGLYCERIDES: 121 mg/dL (ref 0.0–149.0)
VLDL: 24.2 mg/dL (ref 0.0–40.0)

## 2018-09-20 LAB — CBC WITH DIFFERENTIAL/PLATELET
BASOS ABS: 0.1 10*3/uL (ref 0.0–0.1)
Basophils Relative: 0.6 % (ref 0.0–3.0)
EOS ABS: 0.4 10*3/uL (ref 0.0–0.7)
Eosinophils Relative: 3.8 % (ref 0.0–5.0)
HCT: 42.5 % (ref 36.0–46.0)
Hemoglobin: 13.9 g/dL (ref 12.0–15.0)
LYMPHS ABS: 2.5 10*3/uL (ref 0.7–4.0)
Lymphocytes Relative: 25 % (ref 12.0–46.0)
MCHC: 32.7 g/dL (ref 30.0–36.0)
MCV: 85.5 fl (ref 78.0–100.0)
MONO ABS: 0.7 10*3/uL (ref 0.1–1.0)
Monocytes Relative: 7.4 % (ref 3.0–12.0)
NEUTROS ABS: 6.3 10*3/uL (ref 1.4–7.7)
NEUTROS PCT: 63.2 % (ref 43.0–77.0)
PLATELETS: 247 10*3/uL (ref 150.0–400.0)
RBC: 4.97 Mil/uL (ref 3.87–5.11)
RDW: 13.5 % (ref 11.5–15.5)
WBC: 10 10*3/uL (ref 4.0–10.5)

## 2018-09-20 LAB — COMPREHENSIVE METABOLIC PANEL
ALBUMIN: 4.2 g/dL (ref 3.5–5.2)
ALK PHOS: 81 U/L (ref 39–117)
ALT: 17 U/L (ref 0–35)
AST: 17 U/L (ref 0–37)
BILIRUBIN TOTAL: 0.7 mg/dL (ref 0.2–1.2)
BUN: 16 mg/dL (ref 6–23)
CO2: 32 meq/L (ref 19–32)
CREATININE: 1.39 mg/dL — AB (ref 0.40–1.20)
Calcium: 9.4 mg/dL (ref 8.4–10.5)
Chloride: 95 mEq/L — ABNORMAL LOW (ref 96–112)
GFR: 47.95 mL/min — ABNORMAL LOW (ref >60.00)
GLUCOSE: 239 mg/dL — AB (ref 70–99)
Potassium: 3 mEq/L — ABNORMAL LOW (ref 3.5–5.1)
Sodium: 138 mEq/L (ref 135–145)
Total Protein: 7.2 g/dL (ref 6.0–8.3)

## 2018-09-20 LAB — HEMOGLOBIN A1C: Hgb A1c MFr Bld: 6.9 % — ABNORMAL HIGH (ref 4.6–6.5)

## 2018-09-20 MED ORDER — PREDNISONE 20 MG PO TABS: 20.0000 mg | ORAL_TABLET | Freq: Every day | ORAL | 0 refills | Status: DC

## 2018-09-20 MED ORDER — IPRATROPIUM-ALBUTEROL 0.5-2.5 (3) MG/3ML IN SOLN
3.0000 mL | Freq: Once | RESPIRATORY_TRACT | Status: AC
  Administered 2018-09-20: 3 mL via RESPIRATORY_TRACT

## 2018-09-20 MED ORDER — IPRATROPIUM-ALBUTEROL 0.5-2.5 (3) MG/3ML IN SOLN: 3.0000 mL | Freq: Four times a day (QID) | RESPIRATORY_TRACT | Status: DC

## 2018-09-20 MED ORDER — DOXYCYCLINE HYCLATE 100 MG PO TABS: 100.0000 mg | ORAL_TABLET | Freq: Two times a day (BID) | ORAL | 0 refills | Status: DC

## 2018-09-20 NOTE — Telephone Encounter (Signed)
noted 
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
## 2018-09-21 ENCOUNTER — Other Ambulatory Visit: Payer: Self-pay | Admitting: Family

## 2018-09-21 MED ORDER — POTASSIUM CHLORIDE ER 10 MEQ PO TBCR: 10.0000 meq | EXTENDED_RELEASE_TABLET | Freq: Every day | ORAL | 0 refills | Status: DC

## 2018-10-01 ENCOUNTER — Other Ambulatory Visit (INDEPENDENT_AMBULATORY_CARE_PROVIDER_SITE_OTHER): Payer: Medicare Other

## 2018-10-01 ENCOUNTER — Ambulatory Visit (INDEPENDENT_AMBULATORY_CARE_PROVIDER_SITE_OTHER): Payer: Medicare Other | Admitting: Internal Medicine

## 2018-10-01 ENCOUNTER — Encounter: Payer: Self-pay | Admitting: Internal Medicine

## 2018-10-01 VITALS — BP 118/70 | HR 54 | Temp 98.5°F | Ht 63.0 in | Wt 180.0 lb

## 2018-10-01 DIAGNOSIS — N179 Acute kidney failure, unspecified: Secondary | ICD-10-CM

## 2018-10-01 DIAGNOSIS — I10 Essential (primary) hypertension: Secondary | ICD-10-CM

## 2018-10-01 DIAGNOSIS — J4531 Mild persistent asthma with (acute) exacerbation: Secondary | ICD-10-CM | POA: Diagnosis not present

## 2018-10-01 DIAGNOSIS — R490 Dysphonia: Secondary | ICD-10-CM | POA: Diagnosis not present

## 2018-10-01 LAB — BASIC METABOLIC PANEL
BUN: 11 mg/dL (ref 6–23)
CO2: 30 mEq/L (ref 19–32)
Calcium: 9.3 mg/dL (ref 8.4–10.5)
Chloride: 98 mEq/L (ref 96–112)
Creatinine, Ser: 0.89 mg/dL (ref 0.40–1.20)
GFR: 80.21 mL/min (ref >60.00)
GLUCOSE: 217 mg/dL — AB (ref 70–99)
Potassium: 3.3 mEq/L — ABNORMAL LOW (ref 3.5–5.1)
SODIUM: 138 meq/L (ref 135–145)

## 2018-10-01 NOTE — Patient Instructions (Signed)
We will check the blood work today and call you back. Start taking flonase.

## 2018-10-01 NOTE — Assessment & Plan Note (Signed)
Resolving at this time. She will continue her albuterol as needed, breo. She is not taking allegra or flonase and asked her to resume both of these.

## 2018-10-01 NOTE — Progress Notes (Signed)
   Subjective:    [name], female    DOB: 06/24/1946, 72 y.o.   MRN: [id number]  HPI The patient is a 72 YO female coming in for follow up of asthma exacerbation (took doxycycline and prednisone and feeling some improvement, still using albuterol but less, overall is 60% better, still some SOB on exertion, denies fevers or chills). She is also having some hoarseness and mild sinus drainage. She denies headaches. Also needs follow up of AKI found last visit on routine labs. She was sick at the time and not eating well. She has been well hydrated since that time, denies stomach pain or urinary symptoms.   Review of Systems  Constitutional: Positive for activity change. Negative for appetite change, chills, fatigue, fever and unexpected weight change.  HENT: Positive for congestion, postnasal drip, rhinorrhea, sore throat and voice change. Negative for ear discharge, ear pain, sinus pressure, sinus pain, sneezing, tinnitus and trouble swallowing.   Eyes: Negative.   Respiratory: Positive for cough and shortness of breath. Negative for chest tightness and wheezing.   Cardiovascular: Negative.   Gastrointestinal: Negative.   Genitourinary: Negative.   Musculoskeletal: Negative.   Skin: Negative.   Neurological: Negative.       Objective:   Physical Exam Constitutional:      Appearance: She is well-developed.  HENT:     Head: Normocephalic and atraumatic.     Comments: Oropharynx with redness and clear drainage, nose with swollen turbinates, TMs normal bilaterally.  Neck:     Musculoskeletal: Normal range of motion.     Thyroid: No thyromegaly.  Cardiovascular:     Rate and Rhythm: Normal rate and regular rhythm.  Pulmonary:     Effort: Pulmonary effort is normal. No respiratory distress.     Breath sounds: Normal breath sounds. No wheezing or rales.  Abdominal:     General: Bowel sounds are normal. There is no distension.     Palpations: Abdomen is soft.   Tenderness: There is no abdominal tenderness. There is no rebound.  Musculoskeletal:        General: No tenderness.  Lymphadenopathy:     Cervical: No cervical adenopathy.  Skin:    General: Skin is warm and dry.  Neurological:     Mental Status: She is alert and oriented to person, place, and time.     Coordination: Coordination normal.    Vitals:   10/01/18 1325  BP: 118/70  Pulse: (!) 54  Temp: 98.5 F (36.9 C)  TempSrc: Oral  SpO2: 99%  Weight: 180 lb (81.6 kg)  Height: 5\' 3"  (1.6 m)      Assessment & Plan:

## 2018-10-01 NOTE — Assessment & Plan Note (Signed)
Checking BMP given recent change in creatinine. More likely from acute illness.

## 2018-10-01 NOTE — Assessment & Plan Note (Signed)
Checking BMP, likely from acute illness. She has been well hydrated since last visit and no signs of kidney stone or urinary symptoms.

## 2018-10-01 NOTE — Assessment & Plan Note (Signed)
Likely from sinus drainage. She is not taking allegra or flonase and asked to resume both of these for 1-2 weeks.
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
## 2018-11-04 ENCOUNTER — Ambulatory Visit: Payer: Medicare Other

## 2018-11-09 ENCOUNTER — Ambulatory Visit (INDEPENDENT_AMBULATORY_CARE_PROVIDER_SITE_OTHER): Payer: Medicare Other | Admitting: *Deleted

## 2018-11-09 VITALS — BP 134/72 | HR 47 | Resp 17 | Ht 63.0 in | Wt 181.0 lb

## 2018-11-09 DIAGNOSIS — Z1211 Encounter for screening for malignant neoplasm of colon: Secondary | ICD-10-CM | POA: Diagnosis not present

## 2018-11-09 DIAGNOSIS — E11319 Type 2 diabetes mellitus with unspecified diabetic retinopathy without macular edema: Secondary | ICD-10-CM

## 2018-11-09 DIAGNOSIS — E1165 Type 2 diabetes mellitus with hyperglycemia: Secondary | ICD-10-CM | POA: Diagnosis not present

## 2018-11-09 DIAGNOSIS — Z0000 Encounter for general adult medical examination without abnormal findings: Secondary | ICD-10-CM

## 2018-11-09 DIAGNOSIS — IMO0002 Reserved for concepts with insufficient information to code with codable children: Secondary | ICD-10-CM

## 2018-11-09 NOTE — Patient Instructions (Addendum)
Continue doing brain stimulating activities (puzzles, reading, adult coloring books, staying active) to keep memory sharp.   Continue to eat heart healthy diet (full of fruits, vegetables, whole grains, lean protein, water--limit salt, fat, and sugar intake) and increase physical activity as tolerated.  [name] , Thank you for taking time to come for your Medicare Wellness Visit. I appreciate your ongoing commitment to your health goals. Please review the following plan we discussed and let me know if I can assist you in the future.   These are the goals we discussed: Goals    . maintain     Maintain controlled blood sugar levels by making healthy, low carb food choices and increasing activity.     . Patient Stated     Continue to eat healthy, exercise and stay as independent as possible. Enjoy life and family.    . Patient Stated     Increase my social activity by perhaps going to the senior center. Try to drink several bottles of water daily.       This is a list of the screening recommended for you and due dates:  Health Maintenance  Topic Date Due  . Colon Cancer Screening  12/19/2017  . Complete foot exam   11/03/2018  . Mammogram  11/20/2018  . Hemoglobin A1C  03/22/2019  . Eye exam for diabetics  09/10/2019  . Tetanus Vaccine  10/20/2020  . DEXA scan (bone density measurement)  Completed  .  Hepatitis C: One time screening is recommended by Center for Disease Control  (CDC) for  adults born from [birth year] through 1964.   Completed  . Pneumonia vaccines  Completed   Health Maintenance, Female Adopting a healthy lifestyle and getting preventive care can go a long way to promote health and wellness. Talk with your health care provider about what schedule of regular examinations is right for you. This is a good chance for you to check in with your provider about disease prevention and staying healthy. In between checkups, there are plenty of things you can do on your own. Experts  have done a lot of research about which lifestyle changes and preventive measures are most likely to keep you healthy. Ask your health care provider for more information. Weight and diet Eat a healthy diet  Be sure to include plenty of vegetables, fruits, low-fat dairy products, and lean protein.  Do not eat a lot of foods high in solid fats, added sugars, or salt.  Get regular exercise. This is one of the most important things you can do for your health. ? Most adults should exercise for at least 150 minutes each week. The exercise should increase your heart rate and make you sweat (moderate-intensity exercise). ? Most adults should also do strengthening exercises at least twice a week. This is in addition to the moderate-intensity exercise. Maintain a healthy weight  Body mass index (BMI) is a measurement that can be used to identify possible weight problems. It estimates body fat based on height and weight. Your health care provider can help determine your BMI and help you achieve or maintain a healthy weight.  For females 13 years of age and older: ? A BMI below 18.5 is considered underweight. ? A BMI of 18.5 to 24.9 is normal. ? A BMI of 25 to 29.9 is considered overweight. ? A BMI of 30 and above is considered obese. Watch levels of cholesterol and blood lipids  You should start having your blood tested for lipids  and cholesterol at 72 years of age, then have this test every 5 years.  You may need to have your cholesterol levels checked more often if: ? Your lipid or cholesterol levels are high. ? You are older than 72 years of age. ? You are at high risk for heart disease. Cancer screening Lung Cancer  Lung cancer screening is recommended for adults 63-47 years old who are at high risk for lung cancer because of a history of smoking.  A yearly low-dose CT scan of the lungs is recommended for people who: ? Currently smoke. ? Have quit within the past 15 years. ? Have at  least a 30-pack-year history of smoking. A pack year is smoking an average of one pack of cigarettes a day for 1 year.  Yearly screening should continue until it has been 15 years since you quit.  Yearly screening should stop if you develop a health problem that would prevent you from having lung cancer treatment. Breast Cancer  Practice breast self-awareness. This means understanding how your breasts normally appear and feel.  It also means doing regular breast self-exams. Let your health care provider know about any changes, no matter how small.  If you are in your 20s or 30s, you should have a clinical breast exam (CBE) by a health care provider every 1-3 years as part of a regular health exam.  If you are 110 or older, have a CBE every year. Also consider having a breast X-ray (mammogram) every year.  If you have a family history of breast cancer, talk to your health care provider about genetic screening.  If you are at high risk for breast cancer, talk to your health care provider about having an MRI and a mammogram every year.  Breast cancer gene (BRCA) assessment is recommended for women who have family members with BRCA-related cancers. BRCA-related cancers include: ? Breast. ? Ovarian. ? Tubal. ? Peritoneal cancers.  Results of the assessment will determine the need for genetic counseling and BRCA1 and BRCA2 testing. Cervical Cancer Your health care provider may recommend that you be screened regularly for cancer of the pelvic organs (ovaries, uterus, and vagina). This screening involves a pelvic examination, including checking for microscopic changes to the surface of your cervix (Pap test). You may be encouraged to have this screening done every 3 years, beginning at age 59.  For women ages 30-65, health care providers may recommend pelvic exams and Pap testing every 3 years, or they may recommend the Pap and pelvic exam, combined with testing for human papilloma virus (HPV),  every 5 years. Some types of HPV increase your risk of cervical cancer. Testing for HPV may also be done on women of any age with unclear Pap test results.  Other health care providers may not recommend any screening for nonpregnant women who are considered low risk for pelvic cancer and who do not have symptoms. Ask your health care provider if a screening pelvic exam is right for you.  If you have had past treatment for cervical cancer or a condition that could lead to cancer, you need Pap tests and screening for cancer for at least 20 years after your treatment. If Pap tests have been discontinued, your risk factors (such as having a new sexual partner) need to be reassessed to determine if screening should resume. Some women have medical problems that increase the chance of getting cervical cancer. In these cases, your health care provider may recommend more frequent screening and Pap  tests. Colorectal Cancer  This type of cancer can be detected and often prevented.  Routine colorectal cancer screening usually begins at 72 years of age and continues through 72 years of age.  Your health care provider may recommend screening at an earlier age if you have risk factors for colon cancer.  Your health care provider may also recommend using home test kits to check for hidden blood in the stool.  A small camera at the end of a tube can be used to examine your colon directly (sigmoidoscopy or colonoscopy). This is done to check for the earliest forms of colorectal cancer.  Routine screening usually begins at age 20.  Direct examination of the colon should be repeated every 5-10 years through 72 years of age. However, you may need to be screened more often if early forms of precancerous polyps or small growths are found. Skin Cancer  Check your skin from head to toe regularly.  Tell your health care provider about any new moles or changes in moles, especially if there is a change in a mole's shape  or color.  Also tell your health care provider if you have a mole that is larger than the size of a pencil eraser.  Always use sunscreen. Apply sunscreen liberally and repeatedly throughout the day.  Protect yourself by wearing long sleeves, pants, a wide-brimmed hat, and sunglasses whenever you are outside. Heart disease, diabetes, and high blood pressure  High blood pressure causes heart disease and increases the risk of stroke. High blood pressure is more likely to develop in: ? People who have blood pressure in the high end of the normal range (130-139/85-89 mm Hg). ? People who are overweight or obese. ? People who are African American.  If you are 38-52 years of age, have your blood pressure checked every 3-5 years. If you are 90 years of age or older, have your blood pressure checked every year. You should have your blood pressure measured twice-once when you are at a hospital or clinic, and once when you are not at a hospital or clinic. Record the average of the two measurements. To check your blood pressure when you are not at a hospital or clinic, you can use: ? An automated blood pressure machine at a pharmacy. ? A home blood pressure monitor.  If you are between 81 years and 84 years old, ask your health care provider if you should take aspirin to prevent strokes.  Have regular diabetes screenings. This involves taking a blood sample to check your fasting blood sugar level. ? If you are at a normal weight and have a low risk for diabetes, have this test once every three years after 72 years of age. ? If you are overweight and have a high risk for diabetes, consider being tested at a younger age or more often. Preventing infection Hepatitis B  If you have a higher risk for hepatitis B, you should be screened for this virus. You are considered at high risk for hepatitis B if: ? You were born in a country where hepatitis B is common. Ask your health care provider which countries  are considered high risk. ? Your parents were born in a high-risk country, and you have not been immunized against hepatitis B (hepatitis B vaccine). ? You have HIV or AIDS. ? You use needles to inject street drugs. ? You live with someone who has hepatitis B. ? You have had sex with someone who has hepatitis B. ? You  get hemodialysis treatment. ? You take certain medicines for conditions, including cancer, organ transplantation, and autoimmune conditions. Hepatitis C  Blood testing is recommended for: ? Everyone born from [birth year] through 1964. ? Anyone with known risk factors for hepatitis C. Sexually transmitted infections (STIs)  You should be screened for sexually transmitted infections (STIs) including gonorrhea and chlamydia if: ? You are sexually active and are younger than 72 years of age. ? You are older than 72 years of age and your health care provider tells you that you are at risk for this type of infection. ? Your sexual activity has changed since you were last screened and you are at an increased risk for chlamydia or gonorrhea. Ask your health care provider if you are at risk.  If you do not have HIV, but are at risk, it may be recommended that you take a prescription medicine daily to prevent HIV infection. This is called pre-exposure prophylaxis (PrEP). You are considered at risk if: ? You are sexually active and do not regularly use condoms or know the HIV status of your partner(s). ? You take drugs by injection. ? You are sexually active with a partner who has HIV. Talk with your health care provider about whether you are at high risk of being infected with HIV. If you choose to begin PrEP, you should first be tested for HIV. You should then be tested every 3 months for as long as you are taking PrEP. Pregnancy  If you are premenopausal and you may become pregnant, ask your health care provider about preconception counseling.  If you may become pregnant, take 400 to 800  micrograms (mcg) of folic acid every day.  If you want to prevent pregnancy, talk to your health care provider about birth control (contraception). Osteoporosis and menopause  Osteoporosis is a disease in which the bones lose minerals and strength with aging. This can result in serious bone fractures. Your risk for osteoporosis can be identified using a bone density scan.  If you are 22 years of age or older, or if you are at risk for osteoporosis and fractures, ask your health care provider if you should be screened.  Ask your health care provider whether you should take a calcium or vitamin D supplement to lower your risk for osteoporosis.  Menopause may have certain physical symptoms and risks.  Hormone replacement therapy may reduce some of these symptoms and risks. Talk to your health care provider about whether hormone replacement therapy is right for you. Follow these instructions at home:  Schedule regular health, dental, and eye exams.  Stay current with your immunizations.  Do not use any tobacco products including cigarettes, chewing tobacco, or electronic cigarettes.  If you are pregnant, do not drink alcohol.  If you are breastfeeding, limit how much and how often you drink alcohol.  Limit alcohol intake to no more than 1 drink per day for nonpregnant women. One drink equals 12 ounces of beer, 5 ounces of wine, or 1 ounces of hard liquor.  Do not use street drugs.  Do not share needles.  Ask your health care provider for help if you need support or information about quitting drugs.  Tell your health care provider if you often feel depressed.  Tell your health care provider if you have ever been abused or do not feel safe at home. This information is not intended to replace advice given to you by your health care provider. Make sure you discuss any questions you  have with your health care provider. Document Released: 04/21/2011 Document Revised: 03/13/2016 Document  Reviewed: 07/10/2015 [employer] Interactive Patient Education  2019 [employer].
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
## 2018-11-09 NOTE — Progress Notes (Signed)
Medical screening examination/treatment/procedure(s) were performed by non-physician practitioner and as supervising physician I was immediately available for consultation/collaboration. I agree with above. Elizabeth A Crawford, MD 

## 2018-11-10 ENCOUNTER — Encounter: Payer: Self-pay | Admitting: Gastroenterology

## 2018-11-23 ENCOUNTER — Ambulatory Visit (AMBULATORY_SURGERY_CENTER): Payer: Self-pay | Admitting: *Deleted

## 2018-11-23 VITALS — Ht 63.0 in | Wt 181.0 lb

## 2018-11-23 DIAGNOSIS — Z1211 Encounter for screening for malignant neoplasm of colon: Secondary | ICD-10-CM

## 2018-11-23 MED ORDER — PEG 3350-KCL-NA BICARB-NACL 420 G PO SOLR: 4000.0000 mL | Freq: Once | ORAL | 0 refills | Status: AC

## 2018-11-23 NOTE — Progress Notes (Signed)
Patient is allergic to EGGS!! Patient denies any allergies to soy. Patient denies any problems with anesthesia/sedation. Patient denies any oxygen use at home. Patient denies taking any diet/weight loss medications or blood thinners.

## 2018-12-06 ENCOUNTER — Telehealth: Payer: Self-pay | Admitting: Gastroenterology

## 2018-12-06 NOTE — Telephone Encounter (Signed)
Noted! Thank you

## 2018-12-06 NOTE — Telephone Encounter (Signed)
Hi Dr. Tarri Glenn, pt's daughter just called to cancel pt's procedure scheduled for tomorrow 12/07/18. She stated that she did not pick up prep and also forgot to read instructions about preparation for procedure. Pt is rescheduled on 01/21/19. Thank you.

## 2018-12-07 ENCOUNTER — Encounter: Payer: Medicare Other | Admitting: Gastroenterology

## 2018-12-16 ENCOUNTER — Encounter: Payer: Medicare Other | Admitting: Gastroenterology

## 2019-01-21 ENCOUNTER — Encounter: Payer: Medicare Other | Admitting: Gastroenterology

## 2019-02-01 ENCOUNTER — Telehealth: Payer: Self-pay | Admitting: Internal Medicine

## 2019-02-01 DIAGNOSIS — J4530 Mild persistent asthma, uncomplicated: Secondary | ICD-10-CM

## 2019-02-01 NOTE — Telephone Encounter (Signed)
Copied from [place] [phone]. Topic: General - Other >> Feb 01, 2019  3:20 PM Keene Breath wrote: Reason for CRM: Patient's daughter called to inform the nurse or doctor that all of her mother's medications are going to expire soon and she will need refills.  Daughter is calling to get a new script for her daily medications before they expire.  Please call her back to explain the best way for her to get the new scripts.  CB# [phone]

## 2019-02-02 MED ORDER — ASPIRIN 81 MG PO TABS: 81.0000 mg | ORAL_TABLET | Freq: Every day | ORAL | 1 refills | Status: DC

## 2019-02-02 MED ORDER — SITAGLIPTIN PHOSPHATE 100 MG PO TABS: 100.0000 mg | ORAL_TABLET | Freq: Every day | ORAL | 1 refills | Status: DC

## 2019-02-02 MED ORDER — LOSARTAN POTASSIUM-HCTZ 100-12.5 MG PO TABS: 1.0000 | ORAL_TABLET | Freq: Every day | ORAL | 1 refills | Status: DC

## 2019-02-02 MED ORDER — FEXOFENADINE HCL 180 MG PO TABS: 180.0000 mg | ORAL_TABLET | Freq: Every day | ORAL | 1 refills | Status: DC

## 2019-02-02 MED ORDER — AMLODIPINE BESYLATE 5 MG PO TABS: 5.0000 mg | ORAL_TABLET | Freq: Every day | ORAL | 1 refills | Status: DC

## 2019-02-02 MED ORDER — GLIPIZIDE ER 2.5 MG PO TB24: ORAL_TABLET | ORAL | 1 refills | Status: DC

## 2019-02-02 MED ORDER — FLUTICASONE FUROATE-VILANTEROL 200-25 MCG/INH IN AEPB: 1.0000 | INHALATION_SPRAY | Freq: Every day | RESPIRATORY_TRACT | 1 refills | Status: DC

## 2019-02-02 MED ORDER — ASPIRIN 81 MG PO TABS: 81.0000 mg | ORAL_TABLET | Freq: Every day | ORAL | 1 refills | Status: AC

## 2019-02-02 MED ORDER — ALBUTEROL SULFATE HFA 108 (90 BASE) MCG/ACT IN AERS: 1.0000 | INHALATION_SPRAY | Freq: Four times a day (QID) | RESPIRATORY_TRACT | 1 refills | Status: DC | PRN

## 2019-02-02 NOTE — Addendum Note (Signed)
Addended by: Raford Pitcher R on: 02/02/2019 11:16 AM   Modules accepted: Orders

## 2019-02-02 NOTE — Telephone Encounter (Signed)
Okay to do refills and can send in rx for aspirin 81 mg daily.

## 2019-02-02 NOTE — Addendum Note (Signed)
Addended by: Raford Pitcher R on: 02/02/2019 02:02 PM   Modules accepted: Orders

## 2019-02-02 NOTE — Addendum Note (Signed)
Addended by: Raford Pitcher R on: 02/02/2019 10:21 AM   Modules accepted: Orders

## 2019-02-02 NOTE — Telephone Encounter (Signed)
Medications sent to meds by mail as requested by patients daughter

## 2019-02-02 NOTE — Telephone Encounter (Signed)
Called patients daughter back in regard to medications that needed refilled daughter was wondering if patient is able to get the baby aspirin sent as a prescription, states she used to be able to or does she have to continue buying OTC?

## 2019-03-08 DIAGNOSIS — H40013 Open angle with borderline findings, low risk, bilateral: Secondary | ICD-10-CM | POA: Diagnosis not present

## 2019-05-27 ENCOUNTER — Ambulatory Visit: Payer: Self-pay

## 2019-05-27 NOTE — Telephone Encounter (Signed)
Pt's daughter stated they sat outside a little too long and her allergies have started to kick up. She had a stuffy nose and cough. Pt's daughter took temperature and it was 101 but pt has been in the bed all day under the covers so she doesn't know if the temperature is due to being under the covers. She has been giving her Mucinex for the mucus buildup. Pt hasn't complained of any pain. Please return.     Outgoing call to to Patient . Daughter states that patient  Mother was running a fever attribute that to too many blanket on her.  Has been coughing  And feels stuffiness Has had a breathing tx.  Takes her Allegra every morning.  Denies  Red eyes.  Take Mucinex. Encourage Patient to increase water intake if Sx don't improve to call back.     Reason for Disposition . [1] Nasal allergies AND [9] only certain times of year (hay fever)  Answer Assessment - Initial Assessment Questions 1. SYMPTOM: "What's the main symptom you're concerned about?" (e.g., runny nose, stuffiness, sneezing, itching)     Cough, stuffiness,  2. SEVERITY: "How bad is it?" "What does it keep you from doing?" (e.g., sleeping, working)      3. EYES: "Are the eyes also red, watery, and itchy?"      Denies  4. TRIGGER: "What pollen or other allergic substance do you think is causing the symptoms?"      5. TREATMENT: "What medicine are you using?" "What medicine worked best in the past?"     6. OTHER SYMPTOMS: "Do you have any other symptoms?" (e.g., coughing, difficulty breathing, wheezing)    Coughing  7. PREGNANCY: "Is there any chance you are pregnant?" "When was your last menstrual period?"    na  Protocols used: NASAL ALLERGIES (HAY FEVER)-A-AH

## 2019-05-30 NOTE — Telephone Encounter (Signed)
Would recommend testing for covid-19

## 2019-05-30 NOTE — Telephone Encounter (Signed)
LVM for daughter with MD response and to call back so we can get order in for covid testing and give information on where to go

## 2019-06-10 ENCOUNTER — Ambulatory Visit (INDEPENDENT_AMBULATORY_CARE_PROVIDER_SITE_OTHER): Payer: Medicare Other | Admitting: Internal Medicine

## 2019-06-10 DIAGNOSIS — J4531 Mild persistent asthma with (acute) exacerbation: Secondary | ICD-10-CM | POA: Diagnosis not present

## 2019-06-10 DIAGNOSIS — IMO0002 Reserved for concepts with insufficient information to code with codable children: Secondary | ICD-10-CM

## 2019-06-10 DIAGNOSIS — R059 Cough, unspecified: Secondary | ICD-10-CM

## 2019-06-10 DIAGNOSIS — R05 Cough: Secondary | ICD-10-CM

## 2019-06-10 DIAGNOSIS — E1165 Type 2 diabetes mellitus with hyperglycemia: Secondary | ICD-10-CM | POA: Diagnosis not present

## 2019-06-10 DIAGNOSIS — E11319 Type 2 diabetes mellitus with unspecified diabetic retinopathy without macular edema: Secondary | ICD-10-CM | POA: Diagnosis not present

## 2019-06-10 MED ORDER — GUAIFENESIN-DM 100-10 MG/5ML PO SYRP: 5.0000 mL | ORAL_SOLUTION | ORAL | 0 refills | Status: DC | PRN

## 2019-06-10 MED ORDER — AZITHROMYCIN 250 MG PO TABS: ORAL_TABLET | ORAL | 1 refills | Status: DC

## 2019-06-10 MED ORDER — PREDNISONE 10 MG PO TABS: ORAL_TABLET | ORAL | 0 refills | Status: DC

## 2019-06-10 NOTE — Progress Notes (Deleted)
   Subjective:    [name], female    DOB: 1945-12-07, 73 y.o.   MRN: [id number]  HPI    Review of Systems     Objective:   Physical Exam        Assessment & Plan:

## 2019-06-11 ENCOUNTER — Encounter: Payer: Self-pay | Admitting: Internal Medicine

## 2019-06-11 DIAGNOSIS — R059 Cough, unspecified: Secondary | ICD-10-CM | POA: Insufficient documentation

## 2019-06-11 DIAGNOSIS — R05 Cough: Secondary | ICD-10-CM | POA: Insufficient documentation

## 2019-06-11 NOTE — Patient Instructions (Signed)
Please take all new medication as prescribed - the antibiotic, cough med and prednisone  Please continue all other medications as before, and refills have been done if requested.  Please have the pharmacy call with any other refills you may need.  Please keep your appointments with your specialists as you may have planned
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
## 2019-06-11 NOTE — Assessment & Plan Note (Signed)
/  Mild to mod, for predpac asd,  to f/u any worsening symptoms or concerns 

## 2019-06-11 NOTE — Assessment & Plan Note (Signed)
Mild to mod, c/w bronchitis vs pna, for antibx course, cough med prn, declines cxr due to pandemic, to f/u any worsening symptoms or concerns

## 2019-06-11 NOTE — Assessment & Plan Note (Signed)
stable overall by history and exam, recent data reviewed with pt, and pt to continue medical treatment as before,  to f/u any worsening symptoms or concerns  

## 2019-07-27 ENCOUNTER — Encounter: Payer: Self-pay | Admitting: Gynecology

## 2019-08-12 ENCOUNTER — Encounter: Payer: Self-pay | Admitting: Internal Medicine

## 2019-08-12 ENCOUNTER — Other Ambulatory Visit (INDEPENDENT_AMBULATORY_CARE_PROVIDER_SITE_OTHER): Payer: Medicare Other

## 2019-08-12 ENCOUNTER — Ambulatory Visit (INDEPENDENT_AMBULATORY_CARE_PROVIDER_SITE_OTHER): Payer: Medicare Other | Admitting: Internal Medicine

## 2019-08-12 ENCOUNTER — Other Ambulatory Visit: Payer: Self-pay

## 2019-08-12 VITALS — BP 130/82 | HR 54 | Temp 98.8°F | Ht 63.0 in | Wt 179.0 lb

## 2019-08-12 DIAGNOSIS — E1165 Type 2 diabetes mellitus with hyperglycemia: Secondary | ICD-10-CM

## 2019-08-12 DIAGNOSIS — E782 Mixed hyperlipidemia: Secondary | ICD-10-CM

## 2019-08-12 DIAGNOSIS — E11319 Type 2 diabetes mellitus with unspecified diabetic retinopathy without macular edema: Secondary | ICD-10-CM | POA: Diagnosis not present

## 2019-08-12 DIAGNOSIS — E118 Type 2 diabetes mellitus with unspecified complications: Secondary | ICD-10-CM | POA: Diagnosis not present

## 2019-08-12 DIAGNOSIS — IMO0002 Reserved for concepts with insufficient information to code with codable children: Secondary | ICD-10-CM

## 2019-08-12 DIAGNOSIS — I10 Essential (primary) hypertension: Secondary | ICD-10-CM

## 2019-08-12 LAB — LIPID PANEL
Cholesterol: 197 mg/dL (ref 0–200)
HDL: 48.3 mg/dL (ref >39.00)
LDL Cholesterol: 130 mg/dL — ABNORMAL HIGH (ref 0–99)
NonHDL: 148.37
Total CHOL/HDL Ratio: 4
Triglycerides: 94 mg/dL (ref 0.0–149.0)
VLDL: 18.8 mg/dL (ref 0.0–40.0)

## 2019-08-12 LAB — COMPREHENSIVE METABOLIC PANEL
ALT: 28 U/L (ref 0–35)
AST: 22 U/L (ref 0–37)
Albumin: 4.1 g/dL (ref 3.5–5.2)
Alkaline Phosphatase: 76 U/L (ref 39–117)
BUN: 11 mg/dL (ref 6–23)
CO2: 31 mEq/L (ref 19–32)
Calcium: 9 mg/dL (ref 8.4–10.5)
Chloride: 99 mEq/L (ref 96–112)
Creatinine, Ser: 0.95 mg/dL (ref 0.40–1.20)
GFR: 69.82 mL/min (ref >60.00)
Glucose, Bld: 200 mg/dL — ABNORMAL HIGH (ref 70–99)
Potassium: 3.1 mEq/L — ABNORMAL LOW (ref 3.5–5.1)
Sodium: 140 mEq/L (ref 135–145)
Total Bilirubin: 0.7 mg/dL (ref 0.2–1.2)
Total Protein: 6.6 g/dL (ref 6.0–8.3)

## 2019-08-12 LAB — CBC
HCT: 40.1 % (ref 36.0–46.0)
Hemoglobin: 13.1 g/dL (ref 12.0–15.0)
MCHC: 32.6 g/dL (ref 30.0–36.0)
MCV: 88.1 fl (ref 78.0–100.0)
Platelets: 229 10*3/uL (ref 150.0–400.0)
RBC: 4.56 Mil/uL (ref 3.87–5.11)
RDW: 14.3 % (ref 11.5–15.5)
WBC: 8.6 10*3/uL (ref 4.0–10.5)

## 2019-08-12 LAB — MICROALBUMIN / CREATININE URINE RATIO
Creatinine,U: 179 mg/dL
Microalb Creat Ratio: 0.5 mg/g (ref 0.0–30.0)
Microalb, Ur: 0.8 mg/dL (ref 0.0–1.9)

## 2019-08-12 LAB — HEMOGLOBIN A1C: Hgb A1c MFr Bld: 9.2 % — ABNORMAL HIGH (ref 4.6–6.5)

## 2019-08-12 NOTE — Assessment & Plan Note (Addendum)
Previously at goal. With retinopathy. Taking glipizide and januvia. Adjust as needed after checking lipid panel, microalbumin to creatinine ratio and HgA1c.

## 2019-08-12 NOTE — Assessment & Plan Note (Signed)
Checking lipid panel and adjust as needed. Not on meds.  

## 2019-08-12 NOTE — Patient Instructions (Signed)
We will check the labs today and call you back about the results.   If you are having more lightheadedness let us know and we will stop the amlodipine.   Back Exercises These exercises help to make your trunk and back strong. They also help to keep the lower back flexible. Doing these exercises can help to prevent back pain or lessen existing pain.  If you have back pain, try to do these exercises 2-3 times each day or as told by your doctor.  As you get better, do the exercises once each day. Repeat the exercises more often as told by your doctor.  To stop back pain from coming back, do the exercises once each day, or as told by your doctor. Exercises Single knee to chest Do these steps 3-5 times in a row for each leg: 1. Lie on your back on a firm bed or the floor with your legs stretched out. 2. Bring one knee to your chest. 3. Grab your knee or thigh with both hands and hold them it in place. 4. Pull on your knee until you feel a gentle stretch in your lower back or buttocks. 5. Keep doing the stretch for 10-30 seconds. 6. Slowly let go of your leg and straighten it. Pelvic tilt Do these steps 5-10 times in a row: 1. Lie on your back on a firm bed or the floor with your legs stretched out. 2. Bend your knees so they point up to the ceiling. Your feet should be flat on the floor. 3. Tighten your lower belly (abdomen) muscles to press your lower back against the floor. This will make your tailbone point up to the ceiling instead of pointing down to your feet or the floor. 4. Stay in this position for 5-10 seconds while you gently tighten your muscles and breathe evenly. Cat-cow Do these steps until your lower back bends more easily: 1. Get on your hands and knees on a firm surface. Keep your hands under your shoulders, and keep your knees under your hips. You may put padding under your knees. 2. Let your head hang down toward your chest. Tighten (contract) the muscles in your belly.  Point your tailbone toward the floor so your lower back becomes rounded like the back of a cat. 3. Stay in this position for 5 seconds. 4. Slowly lift your head. Let the muscles of your belly relax. Point your tailbone up toward the ceiling so your back forms a sagging arch like the back of a cow. 5. Stay in this position for 5 seconds.  Press-ups Do these steps 5-10 times in a row: 1. Lie on your belly (face-down) on the floor. 2. Place your hands near your head, about shoulder-width apart. 3. While you keep your back relaxed and keep your hips on the floor, slowly straighten your arms to raise the top half of your body and lift your shoulders. Do not use your back muscles. You may change where you place your hands in order to make yourself more comfortable. 4. Stay in this position for 5 seconds. 5. Slowly return to lying flat on the floor.  Bridges Do these steps 10 times in a row: 1. Lie on your back on a firm surface. 2. Bend your knees so they point up to the ceiling. Your feet should be flat on the floor. Your arms should be flat at your sides, next to your body. 3. Tighten your butt muscles and lift your butt off the floor  until your waist is almost as high as your knees. If you do not feel the muscles working in your butt and the back of your thighs, slide your feet 1-2 inches farther away from your butt. 4. Stay in this position for 3-5 seconds. 5. Slowly lower your butt to the floor, and let your butt muscles relax. If this exercise is too easy, try doing it with your arms crossed over your chest. Belly crunches Do these steps 5-10 times in a row: 1. Lie on your back on a firm bed or the floor with your legs stretched out. 2. Bend your knees so they point up to the ceiling. Your feet should be flat on the floor. 3. Cross your arms over your chest. 4. Tip your chin a little bit toward your chest but do not bend your neck. 5. Tighten your belly muscles and slowly raise your chest  just enough to lift your shoulder blades a tiny bit off of the floor. Avoid raising your body higher than that, because it can put too much stress on your low back. 6. Slowly lower your chest and your head to the floor. Back lifts Do these steps 5-10 times in a row: 1. Lie on your belly (face-down) with your arms at your sides, and rest your forehead on the floor. 2. Tighten the muscles in your legs and your butt. 3. Slowly lift your chest off of the floor while you keep your hips on the floor. Keep the back of your head in line with the curve in your back. Look at the floor while you do this. 4. Stay in this position for 3-5 seconds. 5. Slowly lower your chest and your face to the floor. Contact a doctor if:  Your back pain gets a lot worse when you do an exercise.  Your back pain does not get better 2 hours after you exercise. If you have any of these problems, stop doing the exercises. Do not do them again unless your doctor says it is okay. Get help right away if:  You have sudden, very bad back pain. If this happens, stop doing the exercises. Do not do them again unless your doctor says it is okay. This information is not intended to replace advice given to you by your health care provider. Make sure you discuss any questions you have with your health care provider. Document Released: 11/08/2010 Document Revised: 07/01/2018 Document Reviewed: 07/01/2018 Elsevier Patient Education  2020 [employer].

## 2019-08-12 NOTE — Progress Notes (Signed)
   Subjective:   [name], female    DOB: 12/05/1945, 73 y.o.   MRN: [id number]  HPI The patient is a 73 YO female coming in for follow up diabetes (taking glipizide and januvia, on ARB but not statin, denies new numbness in feet or hands, denies low sugars) and cholesterol (not on statin, denies chest pains or stroke symptoms, has concurrent diabetes so LDL goal <100) and blood pressure (taking amlodipine and losartan/hctz, denies headaches or chest pains, denies side effects).   Review of Systems  Constitutional: Negative.   HENT: Negative.   Eyes: Negative.   Respiratory: Negative for cough, chest tightness and shortness of breath.   Cardiovascular: Negative for chest pain, palpitations and leg swelling.  Gastrointestinal: Negative for abdominal distention, abdominal pain, constipation, diarrhea, nausea and vomiting.  Musculoskeletal: Negative.   Skin: Negative.   Neurological: Negative.   Psychiatric/Behavioral: Negative.     Objective:  Physical Exam Constitutional:      Appearance: She is well-developed. She is obese.  HENT:     Head: Normocephalic and atraumatic.  Neck:     Musculoskeletal: Normal range of motion.  Cardiovascular:     Rate and Rhythm: Normal rate and regular rhythm.  Pulmonary:     Effort: Pulmonary effort is normal. No respiratory distress.     Breath sounds: Normal breath sounds. No wheezing or rales.  Abdominal:     General: Bowel sounds are normal. There is no distension.     Palpations: Abdomen is soft.     Tenderness: There is no abdominal tenderness. There is no rebound.  Skin:    General: Skin is warm and dry.  Neurological:     Mental Status: She is alert and oriented to person, place, and time.     Coordination: Coordination normal.     Vitals:   08/12/19 0758  BP: 130/82  Pulse: (!) 54  Temp: 98.8 F (37.1 C)  TempSrc: Oral  SpO2: 97%  Weight: 179 lb (81.2 kg)  Height: 5\' 3"  (1.6 m)    Assessment & Plan:

## 2019-08-12 NOTE — Assessment & Plan Note (Signed)
BP at goal on amlodipine and losartan/hctz. Checking CMP and adjust as needed.

## 2019-08-16 ENCOUNTER — Other Ambulatory Visit: Payer: Self-pay | Admitting: Internal Medicine

## 2019-08-16 MED ORDER — PIOGLITAZONE HCL 15 MG PO TABS: 15.0000 mg | ORAL_TABLET | Freq: Every day | ORAL | 1 refills | Status: DC

## 2019-08-16 MED ORDER — PITAVASTATIN CALCIUM 1 MG PO TABS: 1.0000 mg | ORAL_TABLET | Freq: Every day | ORAL | 1 refills | Status: DC

## 2019-08-16 MED ORDER — POTASSIUM CHLORIDE CRYS ER 20 MEQ PO TBCR: 20.0000 meq | EXTENDED_RELEASE_TABLET | Freq: Every day | ORAL | 0 refills | Status: DC

## 2019-08-25 ENCOUNTER — Telehealth: Payer: Self-pay | Admitting: Internal Medicine

## 2019-08-25 ENCOUNTER — Other Ambulatory Visit: Payer: Self-pay | Admitting: Internal Medicine

## 2019-08-25 ENCOUNTER — Other Ambulatory Visit: Payer: Self-pay

## 2019-08-25 MED ORDER — LOSARTAN POTASSIUM-HCTZ 100-12.5 MG PO TABS: 1.0000 | ORAL_TABLET | Freq: Every day | ORAL | 1 refills | Status: DC

## 2019-08-25 NOTE — Telephone Encounter (Signed)
Copied from [place] [id number]. Topic: Quick Communication - Rx Refill/Question >> Aug 25, 2019 12:41 PM Azarah, Suttle A wrote: Medication: fluticasone (FLONASE) 50 MCG/ACT nasal spray, glipiZIDE (GLUCOTROL XL) 2.5 MG 24 hr tablet, sitaGLIPtin (JANUVIA) 100 MG tablet, amLODipine (NORVASC) 5 MG tablet,  ( losartan-hydrochlorothiazide (HYZAAR) 100-12.5 MG tablet / not in stock please separate Rx    Has the patient contacted their pharmacy? Yes.   (Agent: If no, request that the patient contact the pharmacy for the refill.) (Agent: If yes, when and what did the pharmacy advise?)  Preferred Pharmacy (with phone number or street name): [place], [place] [phone] (Phone) [phone] (Fax)    Agent: Please be advised that RX refills may take up to 3 business days. We ask that you follow-up with your pharmacy.

## 2019-08-25 NOTE — Telephone Encounter (Signed)
Requested medication (s) are due for refill today: yes  Requested medication (s) are on the active medication list: yes   Future visit scheduled: yes  Notes to clinic: Patient states medication in not in stock and needs to have in sent in as 2 pills   Requested Prescriptions  Pending Prescriptions Disp Refills   losartan-hydrochlorothiazide (HYZAAR) 100-12.5 MG tablet 90 tablet 1    Sig: Take 1 tablet by mouth daily.     Cardiovascular: ARB + Diuretic Combos Failed - 08/25/2019 12:56 PM      Failed - K in normal range and within 180 days    Potassium  Date Value Ref Range Status  08/12/2019 3.1 (L) 3.5 - 5.1 mEq/L Final         Passed - Na in normal range and within 180 days    Sodium  Date Value Ref Range Status  08/12/2019 140 135 - 145 mEq/L Final         Passed - Cr in normal range and within 180 days    Creatinine, Ser  Date Value Ref Range Status  08/12/2019 0.95 0.40 - 1.20 mg/dL Final         Passed - Ca in normal range and within 180 days    Calcium  Date Value Ref Range Status  08/12/2019 9.0 8.4 - 10.5 mg/dL Final         Passed - Patient is not pregnant      Passed - Last BP in normal range    BP Readings from Last 1 Encounters:  08/12/19 130/82         Passed - Valid encounter within last 6 months    Recent Outpatient Visits          1 week ago Type 2 diabetes mellitus with complication ([place])   [place], Elizabeth A, MD   2 months ago Cough   [facility] -Georges Mouse, MD   10 months ago Mild persistent asthma with acute exacerbation   [place], Crosby, MD   11 months ago Mild persistent asthma with acute exacerbation   [place], Marvis Repress, Andrew   1 year ago Insect bite of left lower leg, initial encounter   [place], Claudina Lick, MD      Future Appointments      In 2 months [place], [facility]

## 2019-08-25 NOTE — Telephone Encounter (Signed)
Copied from [place] [id number]. Topic: Quick Communication - Rx Refill/Question >> Aug 25, 2019 12:41 PM Krupa, Diantonio A wrote: Medication: fluticasone (FLONASE) 50 MCG/ACT nasal spray, glipiZIDE (GLUCOTROL XL) 2.5 MG 24 hr tablet, sitaGLIPtin (JANUVIA) 100 MG tablet, amLODipine (NORVASC) 5 MG tablet,  ( losartan-hydrochlorothiazide (HYZAAR) 100-12.5 MG tablet / not in stock please separate Rx    Has the patient contacted their pharmacy? Yes.   (Agent: If no, request that the patient contact the pharmacy for the refill.) (Agent: If yes, when and what did the pharmacy advise?)  Preferred Pharmacy (with phone number or street name): [place], [place] [phone] (Phone) [phone] (Fax)    Agent: Please be advised that RX refills may take up to 3 business days. We ask that you follow-up with your pharmacy.

## 2019-08-25 NOTE — Telephone Encounter (Signed)
Pt's meds was sent to wrong pharmacy it should go to [facility]  And the losartan isn't in stock and a new prescription need to be sent for 2 separate pills

## 2019-08-26 MED ORDER — AMLODIPINE BESYLATE 5 MG PO TABS: 5.0000 mg | ORAL_TABLET | Freq: Every day | ORAL | 1 refills | Status: DC

## 2019-08-26 MED ORDER — GLIPIZIDE ER 2.5 MG PO TB24: ORAL_TABLET | ORAL | 1 refills | Status: DC

## 2019-08-26 MED ORDER — SITAGLIPTIN PHOSPHATE 100 MG PO TABS: 100.0000 mg | ORAL_TABLET | Freq: Every day | ORAL | 1 refills | Status: DC

## 2019-08-26 MED ORDER — LOSARTAN POTASSIUM 100 MG PO TABS: 100.0000 mg | ORAL_TABLET | Freq: Every day | ORAL | 1 refills | Status: DC

## 2019-08-26 MED ORDER — HYDROCHLOROTHIAZIDE 12.5 MG PO TABS: 12.5000 mg | ORAL_TABLET | Freq: Every day | ORAL | 1 refills | Status: DC

## 2019-08-26 MED ORDER — FLUTICASONE PROPIONATE 50 MCG/ACT NA SUSP: 2.0000 | Freq: Every day | NASAL | 3 refills | Status: DC

## 2019-08-26 NOTE — Addendum Note (Signed)
Addended by: Judy Pimple R on: 08/26/2019 10:40 AM   Modules accepted: Orders

## 2019-08-26 NOTE — Telephone Encounter (Signed)
Has been re-sent to [facility]. And split the losartan-hctz since not in stock.

## 2019-08-30 DIAGNOSIS — E119 Type 2 diabetes mellitus without complications: Secondary | ICD-10-CM | POA: Diagnosis not present

## 2019-08-30 DIAGNOSIS — H1045 Other chronic allergic conjunctivitis: Secondary | ICD-10-CM | POA: Diagnosis not present

## 2019-08-30 DIAGNOSIS — H40013 Open angle with borderline findings, low risk, bilateral: Secondary | ICD-10-CM | POA: Diagnosis not present

## 2019-08-30 DIAGNOSIS — H04123 Dry eye syndrome of bilateral lacrimal glands: Secondary | ICD-10-CM | POA: Diagnosis not present

## 2019-08-30 LAB — HM DIABETES EYE EXAM

## 2019-08-31 ENCOUNTER — Encounter: Payer: Self-pay | Admitting: Internal Medicine

## 2019-08-31 ENCOUNTER — Telehealth: Payer: Self-pay | Admitting: Emergency Medicine

## 2019-08-31 NOTE — Telephone Encounter (Signed)
Error

## 2019-08-31 NOTE — Progress Notes (Signed)
Abstracted and sent to scan  

## 2019-09-06 ENCOUNTER — Encounter: Payer: Self-pay | Admitting: Internal Medicine

## 2019-10-14 ENCOUNTER — Other Ambulatory Visit: Payer: Self-pay

## 2019-10-14 ENCOUNTER — Emergency Department (HOSPITAL_COMMUNITY): Payer: No Typology Code available for payment source

## 2019-10-14 ENCOUNTER — Encounter (HOSPITAL_COMMUNITY): Payer: Self-pay

## 2019-10-14 ENCOUNTER — Emergency Department (HOSPITAL_COMMUNITY)
Admission: EM | Admit: 2019-10-14 | Discharge: 2019-10-14 | Disposition: A | Discharge status: Home / Self Care | Payer: No Typology Code available for payment source | Attending: Emergency Medicine | Admitting: Emergency Medicine

## 2019-10-14 DIAGNOSIS — I10 Essential (primary) hypertension: Secondary | ICD-10-CM | POA: Insufficient documentation

## 2019-10-14 DIAGNOSIS — Z79899 Other long term (current) drug therapy: Secondary | ICD-10-CM | POA: Diagnosis not present

## 2019-10-14 DIAGNOSIS — Z91010 Allergy to peanuts: Secondary | ICD-10-CM | POA: Insufficient documentation

## 2019-10-14 DIAGNOSIS — Y998 Other external cause status: Secondary | ICD-10-CM | POA: Insufficient documentation

## 2019-10-14 DIAGNOSIS — M542 Cervicalgia: Secondary | ICD-10-CM | POA: Insufficient documentation

## 2019-10-14 DIAGNOSIS — R079 Chest pain, unspecified: Secondary | ICD-10-CM | POA: Insufficient documentation

## 2019-10-14 DIAGNOSIS — M25551 Pain in right hip: Secondary | ICD-10-CM | POA: Diagnosis not present

## 2019-10-14 DIAGNOSIS — M546 Pain in thoracic spine: Secondary | ICD-10-CM | POA: Diagnosis not present

## 2019-10-14 DIAGNOSIS — G44309 Post-traumatic headache, unspecified, not intractable: Secondary | ICD-10-CM | POA: Diagnosis not present

## 2019-10-14 DIAGNOSIS — G44319 Acute post-traumatic headache, not intractable: Secondary | ICD-10-CM | POA: Insufficient documentation

## 2019-10-14 DIAGNOSIS — M25552 Pain in left hip: Secondary | ICD-10-CM | POA: Diagnosis not present

## 2019-10-14 DIAGNOSIS — W2210XA Striking against or struck by unspecified automobile airbag, initial encounter: Secondary | ICD-10-CM | POA: Insufficient documentation

## 2019-10-14 DIAGNOSIS — Z91040 Latex allergy status: Secondary | ICD-10-CM | POA: Insufficient documentation

## 2019-10-14 DIAGNOSIS — M549 Dorsalgia, unspecified: Secondary | ICD-10-CM | POA: Diagnosis not present

## 2019-10-14 DIAGNOSIS — Z7984 Long term (current) use of oral hypoglycemic drugs: Secondary | ICD-10-CM | POA: Insufficient documentation

## 2019-10-14 DIAGNOSIS — S199XXA Unspecified injury of neck, initial encounter: Secondary | ICD-10-CM | POA: Diagnosis not present

## 2019-10-14 DIAGNOSIS — M545 Low back pain, unspecified: Secondary | ICD-10-CM

## 2019-10-14 DIAGNOSIS — S3992XA Unspecified injury of lower back, initial encounter: Secondary | ICD-10-CM | POA: Diagnosis not present

## 2019-10-14 DIAGNOSIS — S3993XA Unspecified injury of pelvis, initial encounter: Secondary | ICD-10-CM | POA: Diagnosis not present

## 2019-10-14 DIAGNOSIS — J45909 Unspecified asthma, uncomplicated: Secondary | ICD-10-CM | POA: Diagnosis not present

## 2019-10-14 DIAGNOSIS — R001 Bradycardia, unspecified: Secondary | ICD-10-CM | POA: Diagnosis not present

## 2019-10-14 DIAGNOSIS — Y92410 Unspecified street and highway as the place of occurrence of the external cause: Secondary | ICD-10-CM | POA: Insufficient documentation

## 2019-10-14 DIAGNOSIS — R52 Pain, unspecified: Secondary | ICD-10-CM

## 2019-10-14 DIAGNOSIS — Y9389 Activity, other specified: Secondary | ICD-10-CM | POA: Diagnosis not present

## 2019-10-14 DIAGNOSIS — E119 Type 2 diabetes mellitus without complications: Secondary | ICD-10-CM | POA: Diagnosis not present

## 2019-10-14 DIAGNOSIS — S299XXA Unspecified injury of thorax, initial encounter: Secondary | ICD-10-CM | POA: Diagnosis not present
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
## 2019-10-14 NOTE — ED Notes (Signed)
[place] pts daughter, wants update on pt
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
## 2019-10-14 NOTE — ED Notes (Signed)
Daughter updated on pt status per request.

## 2019-10-14 NOTE — ED Notes (Signed)
Patient verbalizes understanding of discharge instructions. Opportunity for questioning and answers were provided. Armband removed by staff, pt discharged from ED.  
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
## 2019-11-17 ENCOUNTER — Ambulatory Visit: Payer: Medicare Other

## 2019-11-23 ENCOUNTER — Ambulatory Visit (INDEPENDENT_AMBULATORY_CARE_PROVIDER_SITE_OTHER): Payer: Medicare Other | Admitting: Internal Medicine

## 2019-11-23 ENCOUNTER — Other Ambulatory Visit: Payer: Self-pay

## 2019-11-23 ENCOUNTER — Encounter: Payer: Self-pay | Admitting: Internal Medicine

## 2019-11-23 VITALS — BP 132/84 | HR 62 | Ht 63.0 in | Wt 183.0 lb

## 2019-11-23 DIAGNOSIS — M25511 Pain in right shoulder: Secondary | ICD-10-CM | POA: Diagnosis not present

## 2019-11-23 DIAGNOSIS — M545 Low back pain, unspecified: Secondary | ICD-10-CM

## 2019-11-23 MED ORDER — LIDOCAINE 5 % EX PTCH: 1.0000 | MEDICATED_PATCH | CUTANEOUS | 3 refills | Status: DC

## 2019-11-23 MED ORDER — METHYLPREDNISOLONE ACETATE 40 MG/ML IJ SUSP
40.0000 mg | Freq: Once | INTRAMUSCULAR | Status: AC
  Administered 2019-11-23: 40 mg via INTRAMUSCULAR
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
## 2019-11-23 NOTE — Patient Instructions (Signed)
We have given you a steroid shot today to help the pain.   We have sent in a prescription for lidocaine patches to use on the back and shoulder.   If you are not improving in about 1 week call us back and we will do some physical therapy.

## 2019-11-24 NOTE — Assessment & Plan Note (Signed)
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 It could be coming from the neck. Given depo-medrol 40 mg IM today and rx lidoderm patches. If no improvement will order PT. Can use tylenol or heat as well.

## 2019-11-24 NOTE — Assessment & Plan Note (Signed)
Given depo-medrol 40 mg IM at visit, rx lidoderm patches to help for pain. Advised that heat and tylenol are okay to try as well. If no improvement in 1 week she will let us know and we will get PT ordered to help. 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

## 2019-11-30 ENCOUNTER — Encounter: Payer: Self-pay | Admitting: Internal Medicine

## 2019-11-30 DIAGNOSIS — M545 Low back pain, unspecified: Secondary | ICD-10-CM

## 2019-12-19 ENCOUNTER — Other Ambulatory Visit: Payer: Self-pay | Admitting: Internal Medicine

## 2019-12-19 DIAGNOSIS — Z1231 Encounter for screening mammogram for malignant neoplasm of breast: Secondary | ICD-10-CM

## 2019-12-26 ENCOUNTER — Ambulatory Visit: Payer: Medicare Other | Attending: Internal Medicine | Admitting: Physical Therapy

## 2019-12-26 ENCOUNTER — Other Ambulatory Visit: Payer: Self-pay

## 2019-12-26 ENCOUNTER — Encounter: Payer: Self-pay | Admitting: Physical Therapy

## 2019-12-26 DIAGNOSIS — G8929 Other chronic pain: Secondary | ICD-10-CM | POA: Diagnosis not present

## 2019-12-26 DIAGNOSIS — M25559 Pain in unspecified hip: Secondary | ICD-10-CM | POA: Diagnosis not present

## 2019-12-26 DIAGNOSIS — M62838 Other muscle spasm: Secondary | ICD-10-CM | POA: Insufficient documentation

## 2019-12-26 DIAGNOSIS — R2689 Other abnormalities of gait and mobility: Secondary | ICD-10-CM | POA: Insufficient documentation

## 2019-12-26 DIAGNOSIS — M545 Low back pain: Secondary | ICD-10-CM | POA: Insufficient documentation

## 2019-12-27 ENCOUNTER — Encounter: Payer: Self-pay | Admitting: Physical Therapy
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
## 2019-12-29 ENCOUNTER — Other Ambulatory Visit: Payer: Self-pay

## 2019-12-29 ENCOUNTER — Ambulatory Visit: Payer: Medicare Other

## 2019-12-29 DIAGNOSIS — M62838 Other muscle spasm: Secondary | ICD-10-CM

## 2019-12-29 DIAGNOSIS — G8929 Other chronic pain: Secondary | ICD-10-CM | POA: Diagnosis not present

## 2019-12-29 DIAGNOSIS — M545 Low back pain: Secondary | ICD-10-CM | POA: Diagnosis not present

## 2019-12-29 DIAGNOSIS — R2689 Other abnormalities of gait and mobility: Secondary | ICD-10-CM

## 2019-12-29 DIAGNOSIS — M25559 Pain in unspecified hip: Secondary | ICD-10-CM | POA: Diagnosis not present

## 2019-12-29 NOTE — Patient Instructions (Signed)
Access Code: [email or web address] URL: [email or web address] Date: 12/29/2019 Prepared by: Reginia Naas  Exercises Seated Flexion Stretch with Swiss Ball - 10 reps - 1 sets - 10 second hold - 1x daily - 7x weekly Seated Piriformis Stretch - 3 reps - 1 sets - 20 second hold - 1x daily - 7x weekly
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
## 2020-01-04 ENCOUNTER — Ambulatory Visit: Payer: Medicare Other

## 2020-01-04 ENCOUNTER — Other Ambulatory Visit: Payer: Self-pay

## 2020-01-04 DIAGNOSIS — M25559 Pain in unspecified hip: Secondary | ICD-10-CM | POA: Diagnosis not present

## 2020-01-04 DIAGNOSIS — M62838 Other muscle spasm: Secondary | ICD-10-CM | POA: Diagnosis not present

## 2020-01-04 DIAGNOSIS — G8929 Other chronic pain: Secondary | ICD-10-CM | POA: Diagnosis not present

## 2020-01-04 DIAGNOSIS — R2689 Other abnormalities of gait and mobility: Secondary | ICD-10-CM | POA: Diagnosis not present

## 2020-01-04 DIAGNOSIS — M545 Low back pain, unspecified: Secondary | ICD-10-CM
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
## 2020-01-09 ENCOUNTER — Ambulatory Visit: Payer: Medicare Other | Admitting: Physical Therapy

## 2020-01-12 ENCOUNTER — Other Ambulatory Visit: Payer: Self-pay

## 2020-01-12 ENCOUNTER — Ambulatory Visit: Payer: Medicare Other | Admitting: Physical Therapy

## 2020-01-12 ENCOUNTER — Encounter: Payer: Self-pay | Admitting: Physical Therapy

## 2020-01-12 DIAGNOSIS — M25559 Pain in unspecified hip: Secondary | ICD-10-CM | POA: Diagnosis not present

## 2020-01-12 DIAGNOSIS — M545 Low back pain, unspecified: Secondary | ICD-10-CM

## 2020-01-12 DIAGNOSIS — G8929 Other chronic pain: Secondary | ICD-10-CM

## 2020-01-12 DIAGNOSIS — R2689 Other abnormalities of gait and mobility: Secondary | ICD-10-CM | POA: Diagnosis not present

## 2020-01-12 DIAGNOSIS — M62838 Other muscle spasm: Secondary | ICD-10-CM | POA: Diagnosis not present
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
## 2020-01-16 ENCOUNTER — Other Ambulatory Visit: Payer: Self-pay

## 2020-01-16 ENCOUNTER — Encounter: Payer: Self-pay | Admitting: Physical Therapy

## 2020-01-16 ENCOUNTER — Ambulatory Visit: Payer: Medicare Other | Admitting: Physical Therapy

## 2020-01-16 ENCOUNTER — Encounter: Payer: Self-pay | Admitting: Internal Medicine

## 2020-01-16 DIAGNOSIS — M25559 Pain in unspecified hip: Secondary | ICD-10-CM | POA: Diagnosis not present

## 2020-01-16 DIAGNOSIS — G8929 Other chronic pain: Secondary | ICD-10-CM | POA: Diagnosis not present

## 2020-01-16 DIAGNOSIS — R2689 Other abnormalities of gait and mobility: Secondary | ICD-10-CM

## 2020-01-16 DIAGNOSIS — M545 Low back pain, unspecified: Secondary | ICD-10-CM

## 2020-01-16 DIAGNOSIS — M62838 Other muscle spasm: Secondary | ICD-10-CM

## 2020-01-16 DIAGNOSIS — M25519 Pain in unspecified shoulder: Secondary | ICD-10-CM
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
## 2020-01-17 NOTE — Telephone Encounter (Signed)
Pt/daughter is follow-up on PT referral that was placed back in February...Pamela Morgan

## 2020-01-17 NOTE — Telephone Encounter (Signed)
Pt is currently getting PT from the referral back in February. It was for back pain.  They are needing another referral placed for shoulder pain and they will be able to treat the 2 together.  Thanks Cecille Rubin

## 2020-01-18 ENCOUNTER — Ambulatory Visit: Payer: Medicare Other | Admitting: Physical Therapy

## 2020-01-18 ENCOUNTER — Other Ambulatory Visit: Payer: Self-pay

## 2020-01-18 ENCOUNTER — Ambulatory Visit
Admission: RE | Admit: 2020-01-18 | Discharge: 2020-01-18 | Disposition: A | Discharge status: Home / Self Care | Payer: Medicare Other | Source: Ambulatory Visit | Attending: Internal Medicine | Admitting: Internal Medicine

## 2020-01-18 DIAGNOSIS — R2689 Other abnormalities of gait and mobility: Secondary | ICD-10-CM

## 2020-01-18 DIAGNOSIS — Z1231 Encounter for screening mammogram for malignant neoplasm of breast: Secondary | ICD-10-CM | POA: Diagnosis not present

## 2020-01-18 DIAGNOSIS — M62838 Other muscle spasm: Secondary | ICD-10-CM | POA: Diagnosis not present

## 2020-01-18 DIAGNOSIS — G8929 Other chronic pain: Secondary | ICD-10-CM | POA: Diagnosis not present

## 2020-01-18 DIAGNOSIS — M545 Low back pain: Secondary | ICD-10-CM | POA: Diagnosis not present

## 2020-01-18 DIAGNOSIS — M25559 Pain in unspecified hip: Secondary | ICD-10-CM | POA: Diagnosis not present
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
## 2020-01-23 ENCOUNTER — Other Ambulatory Visit: Payer: Self-pay

## 2020-01-23 ENCOUNTER — Ambulatory Visit: Payer: Medicare Other | Attending: Internal Medicine | Admitting: Physical Therapy

## 2020-01-23 ENCOUNTER — Encounter: Payer: Self-pay | Admitting: Physical Therapy

## 2020-01-23 DIAGNOSIS — M545 Low back pain: Secondary | ICD-10-CM | POA: Insufficient documentation

## 2020-01-23 DIAGNOSIS — M62838 Other muscle spasm: Secondary | ICD-10-CM | POA: Diagnosis not present

## 2020-01-23 DIAGNOSIS — G8929 Other chronic pain: Secondary | ICD-10-CM | POA: Diagnosis not present

## 2020-01-23 DIAGNOSIS — M25612 Stiffness of left shoulder, not elsewhere classified: Secondary | ICD-10-CM | POA: Diagnosis not present

## 2020-01-23 DIAGNOSIS — M25512 Pain in left shoulder: Secondary | ICD-10-CM | POA: Insufficient documentation

## 2020-01-23 DIAGNOSIS — R2689 Other abnormalities of gait and mobility: Secondary | ICD-10-CM | POA: Insufficient documentation
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
## 2020-01-25 ENCOUNTER — Ambulatory Visit: Payer: Medicare Other | Admitting: Physical Therapy

## 2020-01-25 ENCOUNTER — Other Ambulatory Visit: Payer: Self-pay

## 2020-01-25 ENCOUNTER — Encounter: Payer: Self-pay | Admitting: Physical Therapy

## 2020-01-25 DIAGNOSIS — R2689 Other abnormalities of gait and mobility: Secondary | ICD-10-CM | POA: Diagnosis not present

## 2020-01-25 DIAGNOSIS — G8929 Other chronic pain: Secondary | ICD-10-CM | POA: Diagnosis not present

## 2020-01-25 DIAGNOSIS — M62838 Other muscle spasm: Secondary | ICD-10-CM

## 2020-01-25 DIAGNOSIS — M25612 Stiffness of left shoulder, not elsewhere classified: Secondary | ICD-10-CM

## 2020-01-25 DIAGNOSIS — M545 Low back pain: Secondary | ICD-10-CM | POA: Diagnosis not present

## 2020-01-25 DIAGNOSIS — M25512 Pain in left shoulder: Secondary | ICD-10-CM | POA: Diagnosis not present

## 2020-01-26 ENCOUNTER — Encounter: Payer: Self-pay | Admitting: Physical Therapy
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
## 2020-02-01 ENCOUNTER — Ambulatory Visit: Payer: Medicare Other | Admitting: Physical Therapy

## 2020-02-01 ENCOUNTER — Other Ambulatory Visit: Payer: Self-pay

## 2020-02-01 DIAGNOSIS — G8929 Other chronic pain: Secondary | ICD-10-CM

## 2020-02-01 DIAGNOSIS — M25512 Pain in left shoulder: Secondary | ICD-10-CM

## 2020-02-01 DIAGNOSIS — M62838 Other muscle spasm: Secondary | ICD-10-CM

## 2020-02-01 DIAGNOSIS — M545 Low back pain: Secondary | ICD-10-CM | POA: Diagnosis not present

## 2020-02-01 DIAGNOSIS — R2689 Other abnormalities of gait and mobility: Secondary | ICD-10-CM | POA: Diagnosis not present

## 2020-02-01 DIAGNOSIS — M25612 Stiffness of left shoulder, not elsewhere classified: Secondary | ICD-10-CM | POA: Diagnosis not present
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
## 2020-02-10 ENCOUNTER — Other Ambulatory Visit: Payer: Self-pay

## 2020-02-10 ENCOUNTER — Encounter: Payer: Self-pay | Admitting: Physical Therapy

## 2020-02-10 ENCOUNTER — Ambulatory Visit: Payer: Medicare Other | Admitting: Physical Therapy

## 2020-02-10 DIAGNOSIS — M25512 Pain in left shoulder: Secondary | ICD-10-CM | POA: Diagnosis not present

## 2020-02-10 DIAGNOSIS — M545 Low back pain, unspecified: Secondary | ICD-10-CM

## 2020-02-10 DIAGNOSIS — R2689 Other abnormalities of gait and mobility: Secondary | ICD-10-CM

## 2020-02-10 DIAGNOSIS — M62838 Other muscle spasm: Secondary | ICD-10-CM

## 2020-02-10 DIAGNOSIS — M25612 Stiffness of left shoulder, not elsewhere classified: Secondary | ICD-10-CM

## 2020-02-10 DIAGNOSIS — G8929 Other chronic pain: Secondary | ICD-10-CM
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
## 2020-02-13 ENCOUNTER — Ambulatory Visit: Payer: Medicare Other | Admitting: Physical Therapy

## 2020-02-13 ENCOUNTER — Encounter: Payer: Self-pay | Admitting: Physical Therapy

## 2020-02-13 ENCOUNTER — Other Ambulatory Visit: Payer: Self-pay

## 2020-02-13 DIAGNOSIS — M545 Low back pain, unspecified: Secondary | ICD-10-CM

## 2020-02-13 DIAGNOSIS — R2689 Other abnormalities of gait and mobility: Secondary | ICD-10-CM

## 2020-02-13 DIAGNOSIS — M25512 Pain in left shoulder: Secondary | ICD-10-CM | POA: Diagnosis not present

## 2020-02-13 DIAGNOSIS — M62838 Other muscle spasm: Secondary | ICD-10-CM | POA: Diagnosis not present

## 2020-02-13 DIAGNOSIS — M25612 Stiffness of left shoulder, not elsewhere classified: Secondary | ICD-10-CM

## 2020-02-13 DIAGNOSIS — G8929 Other chronic pain: Secondary | ICD-10-CM
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
## 2020-02-16 ENCOUNTER — Other Ambulatory Visit: Payer: Self-pay

## 2020-02-16 ENCOUNTER — Ambulatory Visit: Payer: Medicare Other | Admitting: Physical Therapy

## 2020-02-16 ENCOUNTER — Encounter: Payer: Self-pay | Admitting: Physical Therapy

## 2020-02-16 DIAGNOSIS — M25612 Stiffness of left shoulder, not elsewhere classified: Secondary | ICD-10-CM

## 2020-02-16 DIAGNOSIS — M545 Low back pain: Secondary | ICD-10-CM | POA: Diagnosis not present

## 2020-02-16 DIAGNOSIS — M25512 Pain in left shoulder: Secondary | ICD-10-CM | POA: Diagnosis not present

## 2020-02-16 DIAGNOSIS — G8929 Other chronic pain: Secondary | ICD-10-CM | POA: Diagnosis not present

## 2020-02-16 DIAGNOSIS — R2689 Other abnormalities of gait and mobility: Secondary | ICD-10-CM | POA: Diagnosis not present

## 2020-02-16 DIAGNOSIS — M62838 Other muscle spasm: Secondary | ICD-10-CM
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
## 2020-02-20 ENCOUNTER — Other Ambulatory Visit: Payer: Self-pay

## 2020-02-20 ENCOUNTER — Ambulatory Visit: Payer: Medicare Other | Attending: Internal Medicine | Admitting: Physical Therapy

## 2020-02-20 ENCOUNTER — Encounter: Payer: Self-pay | Admitting: Physical Therapy

## 2020-02-20 DIAGNOSIS — G8929 Other chronic pain: Secondary | ICD-10-CM | POA: Insufficient documentation

## 2020-02-20 DIAGNOSIS — M62838 Other muscle spasm: Secondary | ICD-10-CM | POA: Insufficient documentation

## 2020-02-20 DIAGNOSIS — M25512 Pain in left shoulder: Secondary | ICD-10-CM | POA: Diagnosis not present

## 2020-02-20 DIAGNOSIS — M545 Low back pain: Secondary | ICD-10-CM | POA: Diagnosis not present

## 2020-02-20 DIAGNOSIS — R2689 Other abnormalities of gait and mobility: Secondary | ICD-10-CM | POA: Insufficient documentation

## 2020-02-20 DIAGNOSIS — M25612 Stiffness of left shoulder, not elsewhere classified: Secondary | ICD-10-CM | POA: Insufficient documentation

## 2020-02-21 ENCOUNTER — Encounter: Payer: Self-pay | Admitting: Physical Therapy

## 2020-02-21 NOTE — Addendum Note (Signed)
Addended by: Carney Living on: 02/21/2020 12:22 PM   Modules accepted: Orders
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
## 2020-02-22 ENCOUNTER — Encounter: Payer: Self-pay | Admitting: Physical Therapy

## 2020-02-22 ENCOUNTER — Other Ambulatory Visit: Payer: Self-pay

## 2020-02-22 ENCOUNTER — Ambulatory Visit: Payer: Medicare Other | Admitting: Physical Therapy

## 2020-02-22 DIAGNOSIS — M62838 Other muscle spasm: Secondary | ICD-10-CM | POA: Diagnosis not present

## 2020-02-22 DIAGNOSIS — M545 Low back pain, unspecified: Secondary | ICD-10-CM

## 2020-02-22 DIAGNOSIS — M25512 Pain in left shoulder: Secondary | ICD-10-CM

## 2020-02-22 DIAGNOSIS — M25612 Stiffness of left shoulder, not elsewhere classified: Secondary | ICD-10-CM

## 2020-02-22 DIAGNOSIS — R2689 Other abnormalities of gait and mobility: Secondary | ICD-10-CM

## 2020-02-22 DIAGNOSIS — G8929 Other chronic pain: Secondary | ICD-10-CM | POA: Diagnosis not present
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
## 2020-02-27 ENCOUNTER — Ambulatory Visit: Payer: Medicare Other | Admitting: Physical Therapy

## 2020-02-27 ENCOUNTER — Other Ambulatory Visit: Payer: Self-pay

## 2020-02-27 ENCOUNTER — Encounter: Payer: Self-pay | Admitting: Physical Therapy

## 2020-02-27 DIAGNOSIS — R2689 Other abnormalities of gait and mobility: Secondary | ICD-10-CM

## 2020-02-27 DIAGNOSIS — M25512 Pain in left shoulder: Secondary | ICD-10-CM

## 2020-02-27 DIAGNOSIS — G8929 Other chronic pain: Secondary | ICD-10-CM

## 2020-02-27 DIAGNOSIS — M545 Low back pain: Secondary | ICD-10-CM

## 2020-02-27 DIAGNOSIS — M62838 Other muscle spasm: Secondary | ICD-10-CM

## 2020-02-27 DIAGNOSIS — M25612 Stiffness of left shoulder, not elsewhere classified: Secondary | ICD-10-CM
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
## 2020-02-29 ENCOUNTER — Other Ambulatory Visit: Payer: Self-pay

## 2020-02-29 ENCOUNTER — Encounter: Payer: Self-pay | Admitting: Physical Therapy

## 2020-02-29 ENCOUNTER — Ambulatory Visit: Payer: Medicare Other | Admitting: Physical Therapy

## 2020-02-29 DIAGNOSIS — G8929 Other chronic pain: Secondary | ICD-10-CM

## 2020-02-29 DIAGNOSIS — M25612 Stiffness of left shoulder, not elsewhere classified: Secondary | ICD-10-CM

## 2020-02-29 DIAGNOSIS — M62838 Other muscle spasm: Secondary | ICD-10-CM

## 2020-02-29 DIAGNOSIS — R2689 Other abnormalities of gait and mobility: Secondary | ICD-10-CM | POA: Diagnosis not present

## 2020-02-29 DIAGNOSIS — M545 Low back pain: Secondary | ICD-10-CM | POA: Diagnosis not present

## 2020-02-29 DIAGNOSIS — M25512 Pain in left shoulder: Secondary | ICD-10-CM | POA: Diagnosis not present
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
## 2020-04-24 ENCOUNTER — Telehealth: Payer: Self-pay | Admitting: Internal Medicine

## 2020-04-24 MED ORDER — AMLODIPINE BESYLATE 5 MG PO TABS: 5.0000 mg | ORAL_TABLET | Freq: Every day | ORAL | 1 refills | Status: DC

## 2020-04-24 MED ORDER — SITAGLIPTIN PHOSPHATE 100 MG PO TABS: 100.0000 mg | ORAL_TABLET | Freq: Every day | ORAL | 1 refills | Status: DC

## 2020-04-24 MED ORDER — GLIPIZIDE ER 2.5 MG PO TB24: ORAL_TABLET | ORAL | 1 refills | Status: DC

## 2020-04-24 MED ORDER — LOSARTAN POTASSIUM-HCTZ 100-12.5 MG PO TABS: 1.0000 | ORAL_TABLET | Freq: Every day | ORAL | 1 refills | Status: DC

## 2020-04-24 NOTE — Telephone Encounter (Signed)
New message:   Pt is needing new prescriptions sent to the [place] for sitaGLIPtin (JANUVIA) 100 MG tablet glipiZIDE (GLIPIZIDE XL) 2.5 MG 24 hr tablet amLODipine (NORVASC) 5 MG tablet losartan-hydrochlorothiazide (HYZAAR) 100-12.5 MG tablet  [facility] - [place], [place] - [address]

## 2020-05-01 ENCOUNTER — Ambulatory Visit (INDEPENDENT_AMBULATORY_CARE_PROVIDER_SITE_OTHER): Payer: Medicare Other | Admitting: Internal Medicine

## 2020-05-01 ENCOUNTER — Encounter: Payer: Self-pay | Admitting: Internal Medicine

## 2020-05-01 ENCOUNTER — Other Ambulatory Visit: Payer: Self-pay

## 2020-05-01 VITALS — BP 144/86 | HR 43 | Temp 98.3°F | Ht 63.0 in | Wt 182.0 lb

## 2020-05-01 DIAGNOSIS — R059 Cough, unspecified: Secondary | ICD-10-CM

## 2020-05-01 DIAGNOSIS — I10 Essential (primary) hypertension: Secondary | ICD-10-CM | POA: Diagnosis not present

## 2020-05-01 DIAGNOSIS — R05 Cough: Secondary | ICD-10-CM | POA: Diagnosis not present

## 2020-05-01 DIAGNOSIS — E118 Type 2 diabetes mellitus with unspecified complications: Secondary | ICD-10-CM

## 2020-05-01 LAB — POCT GLYCOSYLATED HEMOGLOBIN (HGB A1C): Hemoglobin A1C: 9.8 % — AB (ref 4.0–5.6)

## 2020-05-01 MED ORDER — BENZONATATE 200 MG PO CAPS: 200.0000 mg | ORAL_CAPSULE | Freq: Three times a day (TID) | ORAL | 0 refills | Status: DC | PRN

## 2020-05-01 NOTE — Assessment & Plan Note (Signed)
Uncontrolled and worse than prior. POC HgA1c done today at visit which was worse at 9.8. She did not ever start actos so removed from list. Advised to take all medications on list regularly and return in 3 months so we can assess how her current regimen is doing since previously she was at goal on same regimen with similar weight. She will take glipizide and [place] for now. On ARB and statin.

## 2020-05-01 NOTE — Assessment & Plan Note (Signed)
Stop amlodipine 5 mg daily. Continue losartan/hctz 100/12.5 mg daily. Given poor compliance recently will need close monitoring as she gets back on her medications regularly.

## 2020-05-01 NOTE — Patient Instructions (Signed)
We will have you stop the amlodipine.   Make sure that you are taking the medicines daily to help the sugars come down.

## 2020-05-01 NOTE — Progress Notes (Signed)
   Subjective:   [name], female    DOB: August 11, 1946, 74 y.o.   MRN: [id number]  HPI The patient is a 74 YO female coming in for concerns about cough (was at baseball game recently and since that time about 1-2 weeks ago is coughing more, inhalers do not seem to help much or nebulizer, she is having some sinus drainage but not severe, some difficulty with managing medications and daughter is trying to make sure that she takes her breo daily, this helps the most, denies fevers or chills, denies change in taste/smell, denies SOB if she takes the breo), and diabetes (never started actos which was advised after last HgA1c 9.2, daughter admits that she is not taking medications regularly and did not want to start her on too much medication all at once, she is getting back on glipizide and januvia in the last week or two, just lost spouse in the last 2 weeks which has been a big change for her emotionally, coping okay so far, denies new numbness or tingling in feet, denies excessive thirst or urination) and blood pressure (getting swelling in the ankles from the amlodipine, taking losartan and hctz separately for now but pharmacy has ordered combination and it should be here soon, has had some issues with compliance but now daughter is helping to manage her medications, denies headaches or chest pains, no stroke symptoms).   Review of Systems  Constitutional: Negative.   HENT: Negative.   Eyes: Negative.   Respiratory: Negative for cough, chest tightness and shortness of breath.   Cardiovascular: Negative for chest pain, palpitations and leg swelling.  Gastrointestinal: Negative for abdominal distention, abdominal pain, constipation, diarrhea, nausea and vomiting.  Musculoskeletal: Negative.   Skin: Negative.   Neurological: Negative.        Difficulty managing medications and finances  Psychiatric/Behavioral: Negative.     Objective:  Physical Exam Constitutional:      Appearance:  She is well-developed.  HENT:     Head: Normocephalic and atraumatic.  Cardiovascular:     Rate and Rhythm: Normal rate and regular rhythm.  Pulmonary:     Effort: Pulmonary effort is normal. No respiratory distress.     Breath sounds: Normal breath sounds. No wheezing or rales.  Abdominal:     General: Bowel sounds are normal. There is no distension.     Palpations: Abdomen is soft.     Tenderness: There is no abdominal tenderness. There is no rebound.  Musculoskeletal:     Cervical back: Normal range of motion.  Skin:    General: Skin is warm and dry.  Neurological:     Mental Status: She is alert and oriented to person, place, and time.     Coordination: Coordination abnormal.     Comments: Slow to stand     Vitals:   05/01/20 1057  BP: (!) 144/86  Pulse: (!) 43  Temp: 98.3 F (36.8 C)  TempSrc: Oral  SpO2: 98%  Weight: 182 lb (82.6 kg)  Height: 5\' 3"  (1.6 m)    This visit occurred during the SARS-CoV-2 public health emergency.  Safety protocols were in place, including screening questions prior to the visit, additional usage of staff PPE, and extensive cleaning of exam room while observing appropriate contact time as indicated for disinfecting solutions.   Assessment & Plan:

## 2020-05-01 NOTE — Assessment & Plan Note (Signed)
Suspect more irritation from dust. Monitor breathing closely. Rx tessalon perles for cough.

## 2020-05-31 ENCOUNTER — Telehealth: Payer: Self-pay

## 2020-05-31 NOTE — Telephone Encounter (Signed)
Pt's sister has been informed.

## 2020-05-31 NOTE — Telephone Encounter (Signed)
She would be able to get the covid-19 vaccine however I would recommend to wait under supervision 30 minutes rather than the usual 15 minutes to ensure no allergic reaction.

## 2020-05-31 NOTE — Telephone Encounter (Signed)
New message    Sister calling is she eligible for COVID vaccine due to her allergies is she able take without complication .

## 2020-06-01 ENCOUNTER — Other Ambulatory Visit: Payer: Self-pay | Admitting: Internal Medicine

## 2020-06-01 DIAGNOSIS — E559 Vitamin D deficiency, unspecified: Secondary | ICD-10-CM

## 2020-07-10 ENCOUNTER — Ambulatory Visit (INDEPENDENT_AMBULATORY_CARE_PROVIDER_SITE_OTHER): Payer: Medicare Other

## 2020-07-10 DIAGNOSIS — Z0000 Encounter for general adult medical examination without abnormal findings: Secondary | ICD-10-CM | POA: Diagnosis not present
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
## 2020-07-10 NOTE — Addendum Note (Signed)
Addended by: Sheral Flow on: 07/10/2020 03:29 PM   Modules accepted: Orders, SmartSet

## 2020-07-10 NOTE — Patient Instructions (Signed)
Pamela Morgan , Thank you for taking time to come for your Medicare Wellness Visit. I appreciate your ongoing commitment to your health goals. Please review the following plan we discussed and let me know if I can assist you in the future.   Screening recommendations/referrals: Colonoscopy: due every 10 years (due 2020) Mammogram: 01/18/2020 Bone Density: 02/23/2014 Recommended yearly ophthalmology/optometry visit for glaucoma screening and checkup Recommended yearly dental visit for hygiene and checkup  Vaccinations: Influenza vaccine: declined due to allergy to eggs Pneumococcal vaccine: declined Tdap vaccine: 10/20/2010; due every 10 years Shingles vaccine: declined   Covid-19: declined   Advanced directives: Please bring a copy of your health care power of attorney and living will to the office at your convenience.  Conditions/risks identified: Yes. Reviewed health maintenance screenings with patient today and relevant education, vaccines, and/or referrals were provided. Continue doing brain stimulating activities (puzzles, reading, adult coloring books, staying active) to keep memory sharp. Continue to eat heart healthy diet (full of fruits, vegetables, whole grains, lean protein, water--limit salt, fat, and sugar intake) and increase physical activity as tolerated.  Next appointment: Please schedule your next Medicare Wellness Visit with your Nurse Health Advisor in 1 year.   Preventive Care 20 Years and Older, Female Preventive care refers to lifestyle choices and visits with your health care provider that can promote health and wellness. What does preventive care include?  A yearly physical exam. This is also called an annual well check.  Dental exams once or twice a year.  Routine eye exams. Ask your health care provider how often you should have your eyes checked.  Personal lifestyle choices, including:  Daily care of your teeth and gums.  Regular physical activity.  Eating a  healthy diet.  Avoiding tobacco and drug use.  Limiting alcohol use.  Practicing safe sex.  Taking low-dose aspirin every day.  Taking vitamin and mineral supplements as recommended by your health care provider. What happens during an annual well check? The services and screenings done by your health care provider during your annual well check will depend on your age, overall health, lifestyle risk factors, and family history of disease. Counseling  Your health care provider may ask you questions about your:  Alcohol use.  Tobacco use.  Drug use.  Emotional well-being.  Home and relationship well-being.  Sexual activity.  Eating habits.  History of falls.  Memory and ability to understand (cognition).  Work and work Statistician.  Reproductive health. Screening  You may have the following tests or measurements:  Height, weight, and BMI.  Blood pressure.  Lipid and cholesterol levels. These may be checked every 5 years, or more frequently if you are over 15 years old.  Skin check.  Lung cancer screening. You may have this screening every year starting at age 74 if you have a 30-pack-year history of smoking and currently smoke or have quit within the past 15 years.  Fecal occult blood test (FOBT) of the stool. You may have this test every year starting at age 50.  Flexible sigmoidoscopy or colonoscopy. You may have a sigmoidoscopy every 5 years or a colonoscopy every 10 years starting at age 2.  Hepatitis C blood test.  Hepatitis B blood test.  Sexually transmitted disease (STD) testing.  Diabetes screening. This is done by checking your blood sugar (glucose) after you have not eaten for a while (fasting). You may have this done every 1-3 years.  Bone density scan. This is done to screen for osteoporosis.  You may have this done starting at age 64.  Mammogram. This may be done every 1-2 years. Talk to your health care provider about how often you should  have regular mammograms. Talk with your health care provider about your test results, treatment options, and if necessary, the need for more tests. Vaccines  Your health care provider may recommend certain vaccines, such as:  Influenza vaccine. This is recommended every year.  Tetanus, diphtheria, and acellular pertussis (Tdap, Td) vaccine. You may need a Td booster every 10 years.  Zoster vaccine. You may need this after age 36.  Pneumococcal 13-valent conjugate (PCV13) vaccine. One dose is recommended after age 40.  Pneumococcal polysaccharide (PPSV23) vaccine. One dose is recommended after age 79. Talk to your health care provider about which screenings and vaccines you need and how often you need them. This information is not intended to replace advice given to you by your health care provider. Make sure you discuss any questions you have with your health care provider. Document Released: 11/02/2015 Document Revised: 06/25/2016 Document Reviewed: 08/07/2015 Elsevier Interactive Patient Education  2017 [employer] Prevention in the Home Falls can cause injuries. They can happen to people of all ages. There are many things you can do to make your home safe and to help prevent falls. What can I do on the outside of my home?  Regularly fix the edges of walkways and driveways and fix any cracks.  Remove anything that might make you trip as you walk through a door, such as a raised step or threshold.  Trim any bushes or trees on the path to your home.  Use bright outdoor lighting.  Clear any walking paths of anything that might make someone trip, such as rocks or tools.  Regularly check to see if handrails are loose or broken. Make sure that both sides of any steps have handrails.  Any raised decks and porches should have guardrails on the edges.  Have any leaves, snow, or ice cleared regularly.  Use sand or salt on walking paths during winter.  Clean up any spills in  your garage right away. This includes oil or grease spills. What can I do in the bathroom?  Use night lights.  Install grab bars by the toilet and in the tub and shower. Do not use towel bars as grab bars.  Use non-skid mats or decals in the tub or shower.  If you need to sit down in the shower, use a plastic, non-slip stool.  Keep the floor dry. Clean up any water that spills on the floor as soon as it happens.  Remove soap buildup in the tub or shower regularly.  Attach bath mats securely with double-sided non-slip rug tape.  Do not have throw rugs and other things on the floor that can make you trip. What can I do in the bedroom?  Use night lights.  Make sure that you have a light by your bed that is easy to reach.  Do not use any sheets or blankets that are too big for your bed. They should not hang down onto the floor.  Have a firm chair that has side arms. You can use this for support while you get dressed.  Do not have throw rugs and other things on the floor that can make you trip. What can I do in the kitchen?  Clean up any spills right away.  Avoid walking on wet floors.  Keep items that you use a  lot in easy-to-reach places.  If you need to reach something above you, use a strong step stool that has a grab bar.  Keep electrical cords out of the way.  Do not use floor polish or wax that makes floors slippery. If you must use wax, use non-skid floor wax.  Do not have throw rugs and other things on the floor that can make you trip. What can I do with my stairs?  Do not leave any items on the stairs.  Make sure that there are handrails on both sides of the stairs and use them. Fix handrails that are broken or loose. Make sure that handrails are as long as the stairways.  Check any carpeting to make sure that it is firmly attached to the stairs. Fix any carpet that is loose or worn.  Avoid having throw rugs at the top or bottom of the stairs. If you do have  throw rugs, attach them to the floor with carpet tape.  Make sure that you have a light switch at the top of the stairs and the bottom of the stairs. If you do not have them, ask someone to add them for you. What else can I do to help prevent falls?  Wear shoes that:  Do not have high heels.  Have rubber bottoms.  Are comfortable and fit you well.  Are closed at the toe. Do not wear sandals.  If you use a stepladder:  Make sure that it is fully opened. Do not climb a closed stepladder.  Make sure that both sides of the stepladder are locked into place.  Ask someone to hold it for you, if possible.  Clearly mark and make sure that you can see:  Any grab bars or handrails.  First and last steps.  Where the edge of each step is.  Use tools that help you move around (mobility aids) if they are needed. These include:  Canes.  Walkers.  Scooters.  Crutches.  Turn on the lights when you go into a dark area. Replace any light bulbs as soon as they burn out.  Set up your furniture so you have a clear path. Avoid moving your furniture around.  If any of your floors are uneven, fix them.  If there are any pets around you, be aware of where they are.  Review your medicines with your doctor. Some medicines can make you feel dizzy. This can increase your chance of falling. Ask your doctor what other things that you can do to help prevent falls. This information is not intended to replace advice given to you by your health care provider. Make sure you discuss any questions you have with your health care provider. Document Released: 08/02/2009 Document Revised: 03/13/2016 Document Reviewed: 11/10/2014 Elsevier Interactive Patient Education  2017 [employer].

## 2020-07-18 ENCOUNTER — Telehealth: Payer: Self-pay | Admitting: Internal Medicine

## 2020-07-19 NOTE — Telephone Encounter (Signed)
error 

## 2020-07-25 ENCOUNTER — Telehealth (INDEPENDENT_AMBULATORY_CARE_PROVIDER_SITE_OTHER): Payer: Medicare Other | Admitting: Family

## 2020-07-25 ENCOUNTER — Other Ambulatory Visit: Payer: Self-pay

## 2020-07-25 DIAGNOSIS — J4530 Mild persistent asthma, uncomplicated: Secondary | ICD-10-CM

## 2020-07-25 DIAGNOSIS — I10 Essential (primary) hypertension: Secondary | ICD-10-CM

## 2020-07-25 DIAGNOSIS — E118 Type 2 diabetes mellitus with unspecified complications: Secondary | ICD-10-CM

## 2020-07-25 DIAGNOSIS — E559 Vitamin D deficiency, unspecified: Secondary | ICD-10-CM

## 2020-07-25 DIAGNOSIS — J4521 Mild intermittent asthma with (acute) exacerbation: Secondary | ICD-10-CM

## 2020-07-25 MED ORDER — OLOPATADINE HCL 0.1 % OP SOLN: 1.0000 [drp] | Freq: Two times a day (BID) | OPHTHALMIC | 0 refills | Status: DC | PRN

## 2020-07-25 MED ORDER — GLIPIZIDE ER 2.5 MG PO TB24: ORAL_TABLET | ORAL | 0 refills | Status: DC

## 2020-07-25 MED ORDER — FEXOFENADINE HCL 180 MG PO TABS: 180.0000 mg | ORAL_TABLET | Freq: Every day | ORAL | 1 refills | Status: DC

## 2020-07-25 MED ORDER — PITAVASTATIN CALCIUM 1 MG PO TABS: 1.0000 mg | ORAL_TABLET | Freq: Every day | ORAL | 0 refills | Status: DC

## 2020-07-25 MED ORDER — DOCUSATE SODIUM 100 MG PO CAPS: 100.0000 mg | ORAL_CAPSULE | Freq: Every day | ORAL | 0 refills | Status: DC

## 2020-07-25 MED ORDER — LOSARTAN POTASSIUM-HCTZ 100-12.5 MG PO TABS: 1.0000 | ORAL_TABLET | Freq: Every day | ORAL | 0 refills | Status: DC

## 2020-07-25 MED ORDER — SITAGLIPTIN PHOSPHATE 100 MG PO TABS: 100.0000 mg | ORAL_TABLET | Freq: Every day | ORAL | 0 refills | Status: DC

## 2020-07-25 MED ORDER — BREO ELLIPTA 200-25 MCG/INH IN AEPB: 1.0000 | INHALATION_SPRAY | Freq: Every day | RESPIRATORY_TRACT | 0 refills | Status: DC

## 2020-07-25 MED ORDER — ALBUTEROL SULFATE HFA 108 (90 BASE) MCG/ACT IN AERS: 1.0000 | INHALATION_SPRAY | Freq: Four times a day (QID) | RESPIRATORY_TRACT | 1 refills | Status: DC | PRN

## 2020-07-25 MED ORDER — ALBUTEROL SULFATE (2.5 MG/3ML) 0.083% IN NEBU: 2.5000 mg | INHALATION_SOLUTION | Freq: Four times a day (QID) | RESPIRATORY_TRACT | 1 refills | Status: DC | PRN
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
## 2020-08-29 DIAGNOSIS — H40013 Open angle with borderline findings, low risk, bilateral: Secondary | ICD-10-CM | POA: Diagnosis not present

## 2020-08-29 DIAGNOSIS — E119 Type 2 diabetes mellitus without complications: Secondary | ICD-10-CM | POA: Diagnosis not present

## 2020-08-29 DIAGNOSIS — H2513 Age-related nuclear cataract, bilateral: Secondary | ICD-10-CM | POA: Diagnosis not present

## 2020-08-29 DIAGNOSIS — H25013 Cortical age-related cataract, bilateral: Secondary | ICD-10-CM | POA: Diagnosis not present

## 2020-08-29 LAB — HM DIABETES EYE EXAM

## 2020-09-03 ENCOUNTER — Telehealth: Payer: Self-pay | Admitting: Internal Medicine

## 2020-09-03 NOTE — Telephone Encounter (Signed)
Patients daughter [name] calling stating that the patient is having sinus drainage and they believe its her allergies and wanted to see if there was anything we could do. Denies appointment at this time.

## 2020-09-04 ENCOUNTER — Other Ambulatory Visit: Payer: Self-pay

## 2020-09-04 ENCOUNTER — Telehealth (HOSPITAL_COMMUNITY): Payer: Self-pay

## 2020-09-04 ENCOUNTER — Telehealth (INDEPENDENT_AMBULATORY_CARE_PROVIDER_SITE_OTHER): Payer: Medicare Other | Admitting: Family Medicine

## 2020-09-04 ENCOUNTER — Other Ambulatory Visit: Payer: Self-pay | Admitting: Physician Assistant

## 2020-09-04 ENCOUNTER — Ambulatory Visit (HOSPITAL_COMMUNITY)
Admission: RE | Admit: 2020-09-04 | Discharge: 2020-09-04 | Disposition: A | Discharge status: Home / Self Care | Payer: Medicare Other | Source: Ambulatory Visit | Attending: Pulmonary Disease | Admitting: Pulmonary Disease

## 2020-09-04 DIAGNOSIS — R509 Fever, unspecified: Secondary | ICD-10-CM

## 2020-09-04 DIAGNOSIS — E118 Type 2 diabetes mellitus with unspecified complications: Secondary | ICD-10-CM

## 2020-09-04 DIAGNOSIS — I10 Essential (primary) hypertension: Secondary | ICD-10-CM | POA: Diagnosis not present

## 2020-09-04 DIAGNOSIS — J4531 Mild persistent asthma with (acute) exacerbation: Secondary | ICD-10-CM

## 2020-09-04 DIAGNOSIS — U071 COVID-19: Secondary | ICD-10-CM

## 2020-09-04 DIAGNOSIS — E6609 Other obesity due to excess calories: Secondary | ICD-10-CM

## 2020-09-04 DIAGNOSIS — R059 Cough, unspecified: Secondary | ICD-10-CM | POA: Diagnosis not present

## 2020-09-04 DIAGNOSIS — R0981 Nasal congestion: Secondary | ICD-10-CM

## 2020-09-04 MED ORDER — ACETAMINOPHEN 325 MG PO TABS
650.0000 mg | ORAL_TABLET | Freq: Once | ORAL | Status: AC
  Administered 2020-09-04: 650 mg via ORAL
  Filled 2020-09-04: qty 2

## 2020-09-04 MED ORDER — EPINEPHRINE 0.3 MG/0.3ML IJ SOAJ: 0.3000 mg | Freq: Once | INTRAMUSCULAR | Status: DC | PRN

## 2020-09-04 MED ORDER — SOTROVIMAB 500 MG/8ML IV SOLN
500.0000 mg | Freq: Once | INTRAVENOUS | Status: AC
  Administered 2020-09-04: 500 mg via INTRAVENOUS

## 2020-09-04 MED ORDER — METHYLPREDNISOLONE SODIUM SUCC 125 MG IJ SOLR: 125.0000 mg | Freq: Once | INTRAMUSCULAR | Status: DC | PRN

## 2020-09-04 MED ORDER — FAMOTIDINE IN NACL 20-0.9 MG/50ML-% IV SOLN: 20.0000 mg | Freq: Once | INTRAVENOUS | Status: DC | PRN

## 2020-09-04 MED ORDER — SODIUM CHLORIDE 0.9 % IV SOLN: INTRAVENOUS | Status: DC | PRN

## 2020-09-04 MED ORDER — ALBUTEROL SULFATE HFA 108 (90 BASE) MCG/ACT IN AERS: 2.0000 | INHALATION_SPRAY | Freq: Once | RESPIRATORY_TRACT | Status: DC | PRN

## 2020-09-04 MED ORDER — DIPHENHYDRAMINE HCL 50 MG/ML IJ SOLN: 50.0000 mg | Freq: Once | INTRAMUSCULAR | Status: DC | PRN

## 2020-09-04 NOTE — Progress Notes (Signed)
  Diagnosis: COVID-19  Physician: Dr. Asencion Noble  Procedure: Allergies reviewed.  Sotrovimab administered via IV infusion after med fact sheet provided to patient and all questions answered. Discharge instructions provided to patient; all questions answered.   Complications: No immediate complications noted.  Temperature improved, but continues to be elevated.  Discussed continued use of Tylenol with patient's daughter, as needed every 6 hours; indicates understanding.  Discharge: Discharged home   Monna Fam 09/04/2020

## 2020-09-04 NOTE — Progress Notes (Signed)
Virtual Visit via Telephone Note  I connected with Pamela Morgan on 09/04/20 at 11:40 AM EST by telephone and verified that I am speaking with the correct person using two identifiers.   I discussed the limitations, risks, security and privacy concerns of performing an evaluation and management service by telephone and the availability of in person appointments. I also discussed with the patient that there may be a patient responsible charge related to this service. The patient expressed understanding and agreed to proceed.  Location patient: home, [place] Location provider: work or home office Participants present for the call: patient, provider, daughter Patient did not have a visit with me in the prior 7 days to address this/these issue(s).   History of Present Illness:  Acute telemedicine visit for upper respiratory symptoms: -Onset: 09/01/2020 -Symptoms include: nasal congestion, cough, mild low grade fever, vomiting when she coughs sometimes - but daughter reports this is normal for her as has a strong gag reflex -Daughter whom patient lives with was diagnosed with Covid 19 by testing today -Denies: CP, SOB  - except if wears mask, diarrhea, wheezing or asthma symptoms today, diarrhea -daughter reports patient is eating, drinking fluids and getting our of bed -Has tried: takes flonase and allegra, on breo for asthma, has alb for as needed use - has not needed today -Pertinent medication allergies: see list - extensive -COVID-19 vaccine status: not vaccinated   Observations/Objective: Patient sounds cheerful and well on the phone. I do not appreciate any SOB. Speech and thought processing are grossly intact. Patient reported vitals:  Assessment and Plan:  Nasal congestion  Cough  Fever, unspecified fever cause  -we discussed possible serious and likely etiologies, options for evaluation and workup, limitations of telemedicine visit vs in person visit, treatment, treatment risks  and precautions. Pt prefers to treat via telemedicine empirically rather than in person at this moment.  Suspect COVID-19 most likely given exposure in the home and unvaccinated.  Advised testing and discussed treatment, potential complications, precautions and follow-up.  Also sent a message to the [facility] regarding this patient given positive test on daughter who is pursuing treatment as well and now this patient with symptoms.  Daughter agrees to set up testing as well.  Currently patient is having mild symptoms and denies any chest pain, shortness of breath or wheezing today.  She is able to get out of bed, eat and drink today.  Recommended given her age and comorbidities that she follow-up with her primary care doctor in 2 to 3 days to check in.  Daughter agrees to schedule this appointment.  They declined any prescriptions at this time as she has her albuterol to use as needed and Tessalon already.  Advised to seek prompt in person care if worsening, new symptoms arise, or if is not improving with treatment. Advised of options for inperson care in case PCP office not available. Did let the patient know that I only do telemedicine shifts for  on Tuesdays and Thursdays and advised a follow up visit with PCP or at an [facility] if has further questions or concerns.   Follow Up Instructions:  I did not refer this patient for an OV with me in the next 24 hours for this/these issue(s).  I discussed the assessment and treatment plan with the patient. The patient was provided an opportunity to ask questions and all were answered. The patient agreed with the plan and demonstrated an understanding of the instructions.   I spent 21  minutes on this encounter.   Lucretia Kern, DO

## 2020-09-04 NOTE — Discharge Instructions (Signed)

## 2020-09-04 NOTE — Patient Instructions (Addendum)
   It was nice to meet you today, and I really hope you are feeling better soon. I help [facility] out with telemedicine visits on Tuesdays and Thursdays and am available for visits on those days. If you have any concerns or questions following this visit please schedule a follow up visit with your Primary Care doctor or seek care at a local urgent care clinic to avoid delays in care.    Seek in person care promptly if your symptoms worsen, new concerns arise or you are not improving with treatment. Call 911 and/or seek emergency care if you symptoms are severe or life threatening.   -stay home while sick, and if you have [place] please stay home for a full 10 days since the onset of symptoms PLUS one day of no fever and feeling better  -[place] COVID19 testing information: [email or web address] OR [phone] Most pharmacies offer testing as well.  -Use the albuterol per instruction as needed  -Use your tessalon per instructions as needed for cough  -call number provided for outpatient COVID19 treatment center [phone], if covid test is positive.  -can use nasal saline a few times per day if nasal congestion  -stay hydrated, drink plenty of fluids and eat small healthy meals - avoid dairy  -can take 1000 IU Vit D3 and Vit C lozenges  per instructions  -check out the CDC website for more information on home care, transmission and treatment for COVID19  -follow up with your doctor in 2-3 days - call today to schedule virtual appointment

## 2020-09-04 NOTE — Progress Notes (Signed)
I connected by phone with Pamela Morgan on 09/04/2020 at 2:24 PM to discuss the potential use of a new treatment for mild to moderate COVID-19 viral infection in non-hospitalized patients.  This patient is a 74 y.o. female that meets the FDA criteria for Emergency Use Authorization of COVID monoclonal antibody sotrovimab, casirivimab/imdevimab or bamlamivimab/estevimab.  Has a (+) direct SARS-CoV-2 viral test result  Has mild or moderate COVID-19   Is NOT hospitalized due to COVID-19  Is within 10 days of symptom onset  Has at least one of the high risk factor(s) for progression to severe COVID-19 and/or hospitalization as defined in EUA.  Specific high risk criteria : Older age (>/= 73 yo), BMI > 25 and Diabetes, Asthma, HTN    I have spoken and communicated the following to the patient or parent/caregiver regarding COVID monoclonal antibody treatment:  1. FDA has authorized the emergency use for the treatment of mild to moderate COVID-19 in adults and pediatric patients with positive results of direct SARS-CoV-2 viral testing who are 45 years of age and older weighing at least 40 kg, and who are at high risk for progressing to severe COVID-19 and/or hospitalization.  2. The significant known and potential risks and benefits of COVID monoclonal antibody, and the extent to which such potential risks and benefits are unknown.  3. Information on available alternative treatments and the risks and benefits of those alternatives, including clinical trials.  4. Patients treated with COVID monoclonal antibody should continue to self-isolate and use infection control measures (e.g., wear mask, isolate, social distance, avoid sharing personal items, clean and disinfect high touch surfaces, and frequent handwashing) according to CDC guidelines.   5. The patient or parent/caregiver has the option to accept or refuse COVID monoclonal antibody treatment.  After reviewing this information with the  patient, the patient has agreed to receive one of the available covid 19 monoclonal antibodies and will be provided an appropriate fact sheet prior to infusion.  Sx onset 11/13. Set up for infusion on 11/16 @ 3:30pm. Directions given to [facility]. Pt is aware that insurance will be charged an infusion fee. Pt is unvaccinated.   Angelena Form 09/04/2020 2:24 PM

## 2020-09-04 NOTE — Telephone Encounter (Signed)
   Daughter calling to report patient feels worse with cough and drainage. Appointment scheduled 11/16 virtual Dr Maudie Mercury

## 2020-09-04 NOTE — Telephone Encounter (Signed)
Called to Discuss with patient about Covid symptoms and the use of the monoclonal antibody infusion for those with mild to moderate Covid symptoms and at a high risk of hospitalization.     Pt appears to qualify for this infusion due to co-morbid conditions and/or a member of an at-risk group in accordance with the FDA Emergency Use Authorization.   RN called and screened patient's daughter, Tobie Perdue, who also stated the patient was COVID positive. Dgt, Ms. Heatley stated her mother's symptoms started on 11/13, tested positive on 16th with a home test, and is currently experiencing shortness of breath and cough. Ms. Eichholz informs me that her mother has a history of asthma, HTN, and DM. RN instructed patient's dgt to come at 3:30 today for an appointment.

## 2020-09-10 ENCOUNTER — Other Ambulatory Visit: Payer: Self-pay

## 2020-09-10 ENCOUNTER — Encounter: Payer: Self-pay | Admitting: Internal Medicine

## 2020-09-10 ENCOUNTER — Telehealth (INDEPENDENT_AMBULATORY_CARE_PROVIDER_SITE_OTHER): Payer: Medicare Other | Admitting: Internal Medicine

## 2020-09-10 DIAGNOSIS — U071 COVID-19: Secondary | ICD-10-CM | POA: Insufficient documentation

## 2020-09-10 MED ORDER — PROMETHAZINE-DM 6.25-15 MG/5ML PO SYRP: 5.0000 mL | ORAL_SOLUTION | Freq: Four times a day (QID) | ORAL | 0 refills | Status: DC | PRN

## 2020-09-10 MED ORDER — PREDNISONE 20 MG PO TABS: 40.0000 mg | ORAL_TABLET | Freq: Every day | ORAL | 0 refills | Status: DC

## 2020-09-10 NOTE — Progress Notes (Signed)
Virtual Visit via Video Note  I connected with Pamela Morgan on 09/10/20 at  3:20 PM EST by a video enabled telemedicine application and verified that I am speaking with the correct person using two identifiers.  The patient and the provider were at separate locations throughout the entire encounter. Patient location: home, Provider location: work   I discussed the limitations of evaluation and management by telemedicine and the availability of in person appointments. The patient expressed understanding and agreed to proceed. The patient and the provider were the only parties present for the visit unless noted in HPI below.  History of Present Illness: The patient is a 74 y.o. female with visit for covid-19 positive. Monoclonal antibody infusion last week. Started about 2 weeks ago initially. She is still coughing and running low grade fevers. She does cough rarely until she throws up. Some SOB. Is overall improving slightly. Denies SOB without activity. Overall it is not worsening but not improving much. Has tried nebulizer treatments to help clear the lungs which would help. Will start an incentive spirometry today.   Observations/Objective: A and O times 3, voice strong, some coughing during visit but able to speak in full sentences without coughing  Assessment and Plan: See problem oriented charting  Follow Up Instructions: rx promethazine/dm cough syrup  Visit time 15 minutes in non-face to face communication with patient and coordination of care.   I discussed the assessment and treatment plan with the patient. The patient was provided an opportunity to ask questions and all were answered. The patient agreed with the plan and demonstrated an understanding of the instructions.   The patient was advised to call back or seek an in-person evaluation if the symptoms worsen or if the condition fails to improve as anticipated.  Hoyt Koch, MD

## 2020-09-10 NOTE — Assessment & Plan Note (Signed)
Rx promethazine/dm cough medicine, prednisone. She has completed monoclonal antibody treatment. Advised on likely timeline for recovery and warning signs to watch for progression.

## 2020-10-15 ENCOUNTER — Ambulatory Visit: Payer: Medicare Other | Admitting: Internal Medicine

## 2020-10-17 ENCOUNTER — Encounter: Payer: Self-pay | Admitting: Internal Medicine

## 2020-10-17 ENCOUNTER — Ambulatory Visit (INDEPENDENT_AMBULATORY_CARE_PROVIDER_SITE_OTHER): Payer: Medicare Other | Admitting: Internal Medicine

## 2020-10-17 ENCOUNTER — Other Ambulatory Visit: Payer: Self-pay

## 2020-10-17 DIAGNOSIS — E118 Type 2 diabetes mellitus with unspecified complications: Secondary | ICD-10-CM

## 2020-10-17 DIAGNOSIS — I10 Essential (primary) hypertension: Secondary | ICD-10-CM

## 2020-10-17 DIAGNOSIS — E559 Vitamin D deficiency, unspecified: Secondary | ICD-10-CM | POA: Diagnosis not present

## 2020-10-17 DIAGNOSIS — U071 COVID-19: Secondary | ICD-10-CM

## 2020-10-17 LAB — CBC WITH DIFFERENTIAL/PLATELET
Basophils Absolute: 0.1 10*3/uL (ref 0.0–0.1)
Basophils Relative: 0.9 % (ref 0.0–3.0)
Eosinophils Absolute: 0.6 10*3/uL (ref 0.0–0.7)
Eosinophils Relative: 7.2 % — ABNORMAL HIGH (ref 0.0–5.0)
HCT: 37.9 % (ref 36.0–46.0)
Hemoglobin: 12.3 g/dL (ref 12.0–15.0)
Lymphocytes Relative: 21.9 % (ref 12.0–46.0)
Lymphs Abs: 1.8 10*3/uL (ref 0.7–4.0)
MCHC: 32.5 g/dL (ref 30.0–36.0)
MCV: 85.8 fl (ref 78.0–100.0)
Monocytes Absolute: 0.6 10*3/uL (ref 0.1–1.0)
Monocytes Relative: 7.7 % (ref 3.0–12.0)
Neutro Abs: 5 10*3/uL (ref 1.4–7.7)
Neutrophils Relative %: 62.3 % (ref 43.0–77.0)
Platelets: 222 10*3/uL (ref 150.0–400.0)
RBC: 4.42 Mil/uL (ref 3.87–5.11)
RDW: 14.2 % (ref 11.5–15.5)
WBC: 8 10*3/uL (ref 4.0–10.5)

## 2020-10-17 LAB — COMPREHENSIVE METABOLIC PANEL
ALT: 27 U/L (ref 0–35)
AST: 25 U/L (ref 0–37)
Albumin: 3.8 g/dL (ref 3.5–5.2)
Alkaline Phosphatase: 75 U/L (ref 39–117)
BUN: 8 mg/dL (ref 6–23)
CO2: 33 mEq/L — ABNORMAL HIGH (ref 19–32)
Calcium: 9.1 mg/dL (ref 8.4–10.5)
Chloride: 98 mEq/L (ref 96–112)
Creatinine, Ser: 0.87 mg/dL (ref 0.40–1.20)
GFR: 65.86 mL/min (ref >60.00)
Glucose, Bld: 365 mg/dL — ABNORMAL HIGH (ref 70–99)
Potassium: 4 mEq/L (ref 3.5–5.1)
Sodium: 139 mEq/L (ref 135–145)
Total Bilirubin: 0.5 mg/dL (ref 0.2–1.2)
Total Protein: 6.5 g/dL (ref 6.0–8.3)

## 2020-10-17 LAB — LIPID PANEL
Cholesterol: 187 mg/dL (ref 0–200)
HDL: 54.9 mg/dL (ref >39.00)
LDL Cholesterol: 113 mg/dL — ABNORMAL HIGH (ref 0–99)
NonHDL: 131.71
Total CHOL/HDL Ratio: 3
Triglycerides: 95 mg/dL (ref 0.0–149.0)
VLDL: 19 mg/dL (ref 0.0–40.0)

## 2020-10-17 LAB — VITAMIN D 25 HYDROXY (VIT D DEFICIENCY, FRACTURES): VITD: 37.96 ng/mL (ref 30.00–100.00)

## 2020-10-17 LAB — HEMOGLOBIN A1C: Hgb A1c MFr Bld: 12.1 % — ABNORMAL HIGH (ref 4.6–6.5)

## 2020-10-17 NOTE — Patient Instructions (Signed)
We will check the labs today and call you back about the results.    

## 2020-10-17 NOTE — Progress Notes (Signed)
   Subjective:   [name], female    DOB: 03/06/1946, 74 y.o.   MRN: [id number]  HPI The patient is a 74 YO female coming in for concerns about post covid-19. She is doing well overall. Denies SOB or cough outside normal. Energy is back fairly normal. Concentration is about normal. Needs follow up on her diabetes as well while she is here. Overall feels well. Not vaccinated for covid-19 and does not want to get this. It is causing stress in her family and she feels that they are harassing her about getting the vaccine. She does not want to get it due to many allergies to many medications she is concerned about this.  Review of Systems  Constitutional: Negative.   HENT: Negative.   Eyes: Negative.   Respiratory: Negative for cough, chest tightness and shortness of breath.   Cardiovascular: Negative for chest pain, palpitations and leg swelling.  Gastrointestinal: Negative for abdominal distention, abdominal pain, constipation, diarrhea, nausea and vomiting.  Musculoskeletal: Positive for arthralgias.  Skin: Negative.   Neurological: Negative.   Psychiatric/Behavioral: Negative.     Objective:  Physical Exam Constitutional:      Appearance: She is well-developed and well-nourished.  HENT:     Head: Normocephalic and atraumatic.  Eyes:     Extraocular Movements: EOM normal.  Cardiovascular:     Rate and Rhythm: Normal rate and regular rhythm.  Pulmonary:     Effort: Pulmonary effort is normal. No respiratory distress.     Breath sounds: Normal breath sounds. No wheezing or rales.  Abdominal:     General: Bowel sounds are normal. There is no distension.     Palpations: Abdomen is soft.     Tenderness: There is no abdominal tenderness. There is no rebound.  Musculoskeletal:        General: No edema.     Cervical back: Normal range of motion.  Skin:    General: Skin is warm and dry.  Neurological:     Mental Status: She is alert and oriented to person, place, and  time.     Coordination: Coordination normal.  Psychiatric:        Mood and Affect: Mood and affect normal.     Vitals:   10/17/20 1007  BP: (!) 144/70  Pulse: (!) 58  Temp: 98.2 F (36.8 C)  TempSrc: Oral  SpO2: 97%  Weight: 176 lb 6.4 oz (80 kg)  Height: 5\' 3"  (1.6 m)    This visit occurred during the SARS-CoV-2 public health emergency.  Safety protocols were in place, including screening questions prior to the visit, additional usage of staff PPE, and extensive cleaning of exam room while observing appropriate contact time as indicated for disinfecting solutions.   Assessment & Plan:  Visit time 25 minutes in face to face communication with patient and coordination of care, additional 5 minutes spent in record review, coordination or care, ordering tests, communicating/referring to other healthcare professionals, documenting in medical records all on the same day of the visit for total time 30 minutes spent on the visit.

## 2020-10-18 NOTE — Assessment & Plan Note (Signed)
Checking HgA1c and lipid panel and CMP. Adjust as needed. She is taking glipizide and januvia and denies low sugars.

## 2020-10-18 NOTE — Assessment & Plan Note (Signed)
Overall improved. Advised on low risk of serious allergic reaction to vaccine. She is not interested in this at this time. She did have monoclonal antibody so would need to wait until minimum 11/02/20 for vaccine if she changes her mind about this and she is informed and understands.
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
## 2020-10-29 ENCOUNTER — Telehealth: Payer: Self-pay | Admitting: Internal Medicine

## 2020-11-02 ENCOUNTER — Other Ambulatory Visit: Payer: Self-pay | Admitting: Internal Medicine

## 2020-11-02 ENCOUNTER — Encounter: Payer: Self-pay | Admitting: Internal Medicine

## 2020-11-02 NOTE — Telephone Encounter (Signed)
Note done available on mychart let me know if it needs to be printed and signed.

## 2020-11-02 NOTE — Telephone Encounter (Signed)
There is a Pharmacist, community message from daughter (on her chart Karysa Heft) about a letter for this patient not being correct from 10/17/20. Can you find out what the letter needs to state so we can do this. Daughter requesting it to say she handles her finances herself but the letter previously done states she is independent in managing her finances so I am not sure what difference is needed.

## 2020-11-02 NOTE — Telephone Encounter (Signed)
Spoke with [name] who states she [[name]] cannot be named in the letter at all.  See below for the only thing it needs to say: "The above named patient is under my direct medical care and does have capacity to make her own financial decisions."

## 2020-11-27 ENCOUNTER — Telehealth: Payer: Self-pay | Admitting: Internal Medicine

## 2020-11-27 MED ORDER — DOCUSATE SODIUM 100 MG PO CAPS: 100.0000 mg | ORAL_CAPSULE | Freq: Every day | ORAL | 0 refills | Status: DC

## 2020-11-27 MED ORDER — OLOPATADINE HCL 0.1 % OP SOLN: 1.0000 [drp] | Freq: Two times a day (BID) | OPHTHALMIC | 0 refills | Status: DC | PRN

## 2020-11-27 MED ORDER — FEXOFENADINE HCL 180 MG PO TABS: 180.0000 mg | ORAL_TABLET | Freq: Every day | ORAL | 1 refills | Status: DC

## 2020-11-27 MED ORDER — LOSARTAN POTASSIUM-HCTZ 100-12.5 MG PO TABS: 1.0000 | ORAL_TABLET | Freq: Every day | ORAL | 0 refills | Status: DC

## 2020-11-27 MED ORDER — GLIPIZIDE ER 2.5 MG PO TB24: ORAL_TABLET | ORAL | 0 refills | Status: DC

## 2020-11-27 MED ORDER — SITAGLIPTIN PHOSPHATE 100 MG PO TABS: 100.0000 mg | ORAL_TABLET | Freq: Every day | ORAL | 0 refills | Status: DC

## 2020-11-27 NOTE — Telephone Encounter (Signed)
1.Medication Requested: losartan-hydrochlorothiazide (HYZAAR) 100-12.5 MG tablet  sitaGLIPtin (JANUVIA) 100 MG tablet  glipiZIDE (GLIPIZIDE XL) 2.5 MG 24 hr tablet  fexofenadine (ALLEGRA) 180 MG tablet  olopatadine (PATANOL) 0.1 % ophthalmic solution  docusate sodium (COLACE) 100 MG capsule    2. Pharmacy ([facility], [facility]) [place], [place]  3. On Med List: yes   4. Last Visit with PCP: 12.29.21  5. Next visit date with PCP: 3.29.22   Agent: Please be advised that RX refills may take up to 3 business days. We ask that you follow-up with your pharmacy.

## 2020-11-27 NOTE — Telephone Encounter (Signed)
Refills have been sent to the patient's mail order pharmacy.

## 2020-12-04 DIAGNOSIS — H25013 Cortical age-related cataract, bilateral: Secondary | ICD-10-CM | POA: Diagnosis not present

## 2020-12-04 DIAGNOSIS — H40013 Open angle with borderline findings, low risk, bilateral: Secondary | ICD-10-CM | POA: Diagnosis not present

## 2020-12-04 DIAGNOSIS — H2513 Age-related nuclear cataract, bilateral: Secondary | ICD-10-CM | POA: Diagnosis not present

## 2020-12-04 DIAGNOSIS — E119 Type 2 diabetes mellitus without complications: Secondary | ICD-10-CM | POA: Diagnosis not present

## 2020-12-20 NOTE — Telephone Encounter (Signed)
Ok to refill but if not due likely insurance will not pay for these and she may need to pay out of pocket for them.

## 2020-12-20 NOTE — Telephone Encounter (Signed)
Patients daughter called and said that her mother misplaced all her medications and was wondering if they could be sent again for a refill. Please advise   1.Medication Requested: losartan-hydrochlorothiazide (HYZAAR) 100-12.5 MG tablet  sitaGLIPtin (JANUVIA) 100 MG tablet  glipiZIDE (GLIPIZIDE XL) 2.5 MG 24 hr tablet  fexofenadine (ALLEGRA) 180 MG tablet  olopatadine (PATANOL) 0.1 % ophthalmic solution  docusate sodium (COLACE) 100 MG capsule    2. Pharmacy (Name, [facility]) [place], [place]  3. On Med List: yes   4. Last Visit with PCP: 12.29.21  5. Next visit date with PCP: 3.29.22   Agent: Please be advised that RX refills may take up to 3 business days. We ask that you follow-up with your pharmacy.

## 2020-12-20 NOTE — Telephone Encounter (Signed)
Ok to send another refill? Please advise

## 2020-12-21 MED ORDER — GLIPIZIDE ER 2.5 MG PO TB24: ORAL_TABLET | ORAL | 0 refills | Status: DC

## 2020-12-21 MED ORDER — OLOPATADINE HCL 0.1 % OP SOLN: 1.0000 [drp] | Freq: Two times a day (BID) | OPHTHALMIC | 0 refills | Status: DC | PRN

## 2020-12-21 MED ORDER — SITAGLIPTIN PHOSPHATE 100 MG PO TABS: 100.0000 mg | ORAL_TABLET | Freq: Every day | ORAL | 0 refills | Status: DC

## 2020-12-21 MED ORDER — LOSARTAN POTASSIUM-HCTZ 100-12.5 MG PO TABS: 1.0000 | ORAL_TABLET | Freq: Every day | ORAL | 0 refills | Status: DC

## 2020-12-21 MED ORDER — DOCUSATE SODIUM 100 MG PO CAPS: 100.0000 mg | ORAL_CAPSULE | Freq: Every day | ORAL | 0 refills | Status: AC

## 2020-12-21 MED ORDER — FEXOFENADINE HCL 180 MG PO TABS: 180.0000 mg | ORAL_TABLET | Freq: Every day | ORAL | 1 refills | Status: DC

## 2020-12-21 NOTE — Telephone Encounter (Signed)
Refills have been sent to the patient's pharmacy 

## 2020-12-21 NOTE — Addendum Note (Signed)
Addended by: Thomes Cake on: 12/21/2020 11:18 AM   Modules accepted: Orders

## 2020-12-24 ENCOUNTER — Other Ambulatory Visit: Payer: Self-pay | Admitting: Internal Medicine

## 2020-12-24 DIAGNOSIS — Z1231 Encounter for screening mammogram for malignant neoplasm of breast: Secondary | ICD-10-CM

## 2020-12-25 NOTE — Telephone Encounter (Signed)
See below

## 2020-12-25 NOTE — Telephone Encounter (Signed)
Call patient and let her know that her pharmacy is unable to give her allegra.

## 2020-12-28 ENCOUNTER — Ambulatory Visit: Payer: Medicare Other | Admitting: Internal Medicine

## 2021-01-11 ENCOUNTER — Encounter: Payer: Self-pay | Admitting: Internal Medicine

## 2021-01-15 ENCOUNTER — Other Ambulatory Visit: Payer: Self-pay

## 2021-01-15 ENCOUNTER — Encounter: Payer: Self-pay | Admitting: Internal Medicine

## 2021-01-15 ENCOUNTER — Ambulatory Visit (INDEPENDENT_AMBULATORY_CARE_PROVIDER_SITE_OTHER): Payer: Medicare Other | Admitting: Internal Medicine

## 2021-01-15 VITALS — BP 130/88 | HR 54 | Temp 98.3°F | Resp 18 | Ht 63.0 in | Wt 176.0 lb

## 2021-01-15 DIAGNOSIS — Z111 Encounter for screening for respiratory tuberculosis: Secondary | ICD-10-CM

## 2021-01-15 DIAGNOSIS — E118 Type 2 diabetes mellitus with unspecified complications: Secondary | ICD-10-CM | POA: Diagnosis not present

## 2021-01-15 DIAGNOSIS — R0789 Other chest pain: Secondary | ICD-10-CM | POA: Diagnosis not present

## 2021-01-15 DIAGNOSIS — I10 Essential (primary) hypertension: Secondary | ICD-10-CM | POA: Diagnosis not present

## 2021-01-15 DIAGNOSIS — F0390 Unspecified dementia without behavioral disturbance: Secondary | ICD-10-CM | POA: Insufficient documentation

## 2021-01-15 DIAGNOSIS — R413 Other amnesia: Secondary | ICD-10-CM

## 2021-01-15 LAB — COMPREHENSIVE METABOLIC PANEL
ALT: 23 U/L (ref 0–35)
AST: 20 U/L (ref 0–37)
Albumin: 4.1 g/dL (ref 3.5–5.2)
Alkaline Phosphatase: 81 U/L (ref 39–117)
BUN: 13 mg/dL (ref 6–23)
CO2: 34 mEq/L — ABNORMAL HIGH (ref 19–32)
Calcium: 9.6 mg/dL (ref 8.4–10.5)
Chloride: 100 mEq/L (ref 96–112)
Creatinine, Ser: 0.93 mg/dL (ref 0.40–1.20)
GFR: 60.69 mL/min (ref >60.00)
Glucose, Bld: 212 mg/dL — ABNORMAL HIGH (ref 70–99)
Potassium: 3.8 mEq/L (ref 3.5–5.1)
Sodium: 141 mEq/L (ref 135–145)
Total Bilirubin: 0.5 mg/dL (ref 0.2–1.2)
Total Protein: 7.1 g/dL (ref 6.0–8.3)

## 2021-01-15 LAB — LIPID PANEL
Cholesterol: 217 mg/dL — ABNORMAL HIGH (ref 0–200)
HDL: 68.4 mg/dL (ref >39.00)
LDL Cholesterol: 127 mg/dL — ABNORMAL HIGH (ref 0–99)
NonHDL: 148.38
Total CHOL/HDL Ratio: 3
Triglycerides: 108 mg/dL (ref 0.0–149.0)
VLDL: 21.6 mg/dL (ref 0.0–40.0)

## 2021-01-15 LAB — CBC
HCT: 42.1 % (ref 36.0–46.0)
Hemoglobin: 13.8 g/dL (ref 12.0–15.0)
MCHC: 32.9 g/dL (ref 30.0–36.0)
MCV: 85.7 fl (ref 78.0–100.0)
Platelets: 203 10*3/uL (ref 150.0–400.0)
RBC: 4.91 Mil/uL (ref 3.87–5.11)
RDW: 13.1 % (ref 11.5–15.5)
WBC: 7.9 10*3/uL (ref 4.0–10.5)

## 2021-01-15 LAB — VITAMIN B12: Vitamin B-12: 968 pg/mL — ABNORMAL HIGH (ref 211–911)

## 2021-01-15 LAB — HEMOGLOBIN A1C: Hgb A1c MFr Bld: 9 % — ABNORMAL HIGH (ref 4.6–6.5)

## 2021-01-15 NOTE — Assessment & Plan Note (Signed)
They are unable to articulate today clearly the memory changes. Some increased questions. Daughter has concerns about driving ability which she did not voice in front of patient today. Will recommend CT head and labs today to assess. No driving pending further evaluation. Patient is not sure she feels she needs neurology assessment.

## 2021-01-15 NOTE — Assessment & Plan Note (Signed)
BP at goal on losartan/hctz. Checking CMP and adjust as needed. EKG done for atypical chest pain without changes except LVH and reminded that good BP control is helpful.

## 2021-01-15 NOTE — Progress Notes (Signed)
   Subjective:   [name], female    DOB: [birth year], 75 y.o.   MRN: [id number]  HPI The patient is a 75 YO female coming in for concerns about memory (problems with memory, daughter and patient unable to describe well today, daughter is concerned about her driving, she admits to some memory things and maybe repeating things or questions) and needs TB evaluation (going to senior care facility, needs screening for this, denies cough or night sweats or change in weight) and atypical chest pain (sometimes gets chest pains, hurts for several weeks then stops, no pain today, denies palpitations, denies missing medications) and blood pressure (taking losartan/hctz, some rare chest pains see separate, denies headaches, denies missing medications, denies side effects).   Review of Systems  Constitutional: Negative.   HENT: Negative.   Eyes: Negative.   Respiratory: Positive for shortness of breath. Negative for cough and chest tightness.        Stable, chronic  Cardiovascular: Negative for chest pain, palpitations and leg swelling.  Gastrointestinal: Negative for abdominal distention, abdominal pain, constipation, diarrhea, nausea and vomiting.  Musculoskeletal: Positive for arthralgias.  Skin: Negative.   Neurological: Negative.        Memory change  Psychiatric/Behavioral: Negative.     Objective:  Physical Exam Constitutional:      Appearance: She is well-developed.  HENT:     Head: Normocephalic and atraumatic.  Cardiovascular:     Rate and Rhythm: Normal rate and regular rhythm.  Pulmonary:     Effort: Pulmonary effort is normal. No respiratory distress.     Breath sounds: Normal breath sounds. No wheezing or rales.  Abdominal:     General: Bowel sounds are normal. There is no distension.     Palpations: Abdomen is soft.     Tenderness: There is no abdominal tenderness. There is no rebound.  Musculoskeletal:     Cervical back: Normal range of motion.  Skin:     General: Skin is warm and dry.  Neurological:     Mental Status: She is alert and oriented to person, place, and time.     Coordination: Coordination normal.     Vitals:   01/15/21 1025  BP: 130/88  Pulse: (!) 54  Resp: 18  Temp: 98.3 F (36.8 C)  TempSrc: Oral  SpO2: 96%  Weight: 176 lb (79.8 kg)  Height: 5\' 3"  (1.6 m)   EKG: Rate 51, axis left, sinus brady, interval normal, no st or t wave changes, LVH, no significant change from 2020  This visit occurred during the SARS-CoV-2 public health emergency.  Safety protocols were in place, including screening questions prior to the visit, additional usage of staff PPE, and extensive cleaning of exam room while observing appropriate contact time as indicated for disinfecting solutions.   Assessment & Plan:

## 2021-01-15 NOTE — Patient Instructions (Signed)
We will check the blood work today and the scan of the head.   No driving until we get that scan.   We did an EKG of the heart which looks the same as from 2020.

## 2021-01-15 NOTE — Assessment & Plan Note (Signed)
Due for HgA1c and checking today. Adjust januvia and glipizide as needed. No low sugars. On statin and ARB.

## 2021-01-15 NOTE — Assessment & Plan Note (Signed)
EKG done at visit without change from prior. LVH and BP at goal today. She is taking Production assistant, radio. Checking CMP.

## 2021-01-17 LAB — QUANTIFERON-TB GOLD PLUS
Mitogen-NIL: >10 IU/mL
NIL: 0.02 IU/mL
QuantiFERON-TB Gold Plus: NEGATIVE
TB1-NIL: 0 IU/mL
TB2-NIL: 0.01 IU/mL

## 2021-01-30 ENCOUNTER — Other Ambulatory Visit: Payer: Self-pay

## 2021-01-30 ENCOUNTER — Ambulatory Visit
Admission: RE | Admit: 2021-01-30 | Discharge: 2021-01-30 | Disposition: A | Discharge status: Home / Self Care | Payer: Medicare Other | Source: Ambulatory Visit | Attending: Internal Medicine | Admitting: Internal Medicine

## 2021-01-30 DIAGNOSIS — R413 Other amnesia: Secondary | ICD-10-CM

## 2021-02-14 ENCOUNTER — Inpatient Hospital Stay: Admission: RE | Admit: 2021-02-14 | Payer: Medicare Other | Source: Ambulatory Visit

## 2021-03-04 ENCOUNTER — Telehealth: Payer: Self-pay | Admitting: Internal Medicine

## 2021-03-04 NOTE — Telephone Encounter (Signed)
  Daughter requesting refill sitaGLIPtin (JANUVIA) 100 MG tablet fexofenadine (ALLEGRA) 180 MG tablet glipiZIDE (GLIPIZIDE XL) 2.5 MG 24 hr tablet losartan-hydrochlorothiazide (HYZAAR) 100-12.5 MG tablet fluticasone furoate-vilanterol (BREO ELLIPTA) 200-25 MCG/INH [employer], [place]

## 2021-03-06 MED ORDER — GLIPIZIDE ER 2.5 MG PO TB24: ORAL_TABLET | ORAL | 0 refills | Status: DC

## 2021-03-06 MED ORDER — LOSARTAN POTASSIUM-HCTZ 100-12.5 MG PO TABS: 1.0000 | ORAL_TABLET | Freq: Every day | ORAL | 0 refills | Status: DC

## 2021-03-06 MED ORDER — FEXOFENADINE HCL 180 MG PO TABS: 180.0000 mg | ORAL_TABLET | Freq: Every day | ORAL | 1 refills | Status: AC

## 2021-03-06 MED ORDER — SITAGLIPTIN PHOSPHATE 100 MG PO TABS: 100.0000 mg | ORAL_TABLET | Freq: Every day | ORAL | 0 refills | Status: DC

## 2021-03-06 MED ORDER — BREO ELLIPTA 200-25 MCG/INH IN AEPB: 1.0000 | INHALATION_SPRAY | Freq: Every day | RESPIRATORY_TRACT | 0 refills | Status: DC

## 2021-03-06 NOTE — Telephone Encounter (Signed)
Medication has been sent to the patient's pharmacy.  

## 2021-03-06 NOTE — Telephone Encounter (Signed)
See below

## 2021-03-12 MED ORDER — BREO ELLIPTA 200-25 MCG/INH IN AEPB: 1.0000 | INHALATION_SPRAY | Freq: Every day | RESPIRATORY_TRACT | 0 refills | Status: DC

## 2021-03-12 NOTE — Addendum Note (Signed)
Addended by: Thomes Cake on: 03/12/2021 03:12 PM   Modules accepted: Orders

## 2021-03-12 NOTE — Telephone Encounter (Signed)
Patients daughter called and was wondering if a short supply of fluticasone furoate-vilanterol (BREO ELLIPTA) 200-25 MCG/INH [facility] Could be sent to [facility], [place] - [address] AT [place]. Please advise

## 2021-03-12 NOTE — Telephone Encounter (Signed)
Ok to send short supply.

## 2021-03-12 NOTE — Telephone Encounter (Signed)
Fine to send 1 inhaler to local pharmacy

## 2021-04-05 ENCOUNTER — Other Ambulatory Visit: Payer: Self-pay

## 2021-04-05 ENCOUNTER — Ambulatory Visit
Admission: RE | Admit: 2021-04-05 | Discharge: 2021-04-05 | Disposition: A | Discharge status: Home / Self Care | Payer: Medicare Other | Source: Ambulatory Visit | Attending: Internal Medicine | Admitting: Internal Medicine

## 2021-04-05 DIAGNOSIS — Z1231 Encounter for screening mammogram for malignant neoplasm of breast: Secondary | ICD-10-CM

## 2021-05-03 ENCOUNTER — Encounter: Payer: Self-pay | Admitting: Internal Medicine

## 2021-05-03 MED ORDER — PREDNISONE 20 MG PO TABS: 40.0000 mg | ORAL_TABLET | Freq: Every day | ORAL | 0 refills | Status: DC

## 2021-06-03 DIAGNOSIS — H2513 Age-related nuclear cataract, bilateral: Secondary | ICD-10-CM | POA: Diagnosis not present

## 2021-06-03 DIAGNOSIS — H25013 Cortical age-related cataract, bilateral: Secondary | ICD-10-CM | POA: Diagnosis not present

## 2021-06-03 DIAGNOSIS — E119 Type 2 diabetes mellitus without complications: Secondary | ICD-10-CM | POA: Diagnosis not present

## 2021-06-03 DIAGNOSIS — H40013 Open angle with borderline findings, low risk, bilateral: Secondary | ICD-10-CM | POA: Diagnosis not present

## 2021-07-16 ENCOUNTER — Ambulatory Visit: Payer: Medicare Other | Admitting: Internal Medicine

## 2021-07-26 ENCOUNTER — Ambulatory Visit (INDEPENDENT_AMBULATORY_CARE_PROVIDER_SITE_OTHER): Payer: Medicare Other | Admitting: Internal Medicine

## 2021-07-26 ENCOUNTER — Other Ambulatory Visit: Payer: Self-pay

## 2021-07-26 ENCOUNTER — Encounter: Payer: Self-pay | Admitting: Internal Medicine

## 2021-07-26 VITALS — BP 130/70 | HR 54 | Resp 18 | Ht 63.0 in | Wt 189.0 lb

## 2021-07-26 DIAGNOSIS — R413 Other amnesia: Secondary | ICD-10-CM

## 2021-07-26 DIAGNOSIS — E1169 Type 2 diabetes mellitus with other specified complication: Secondary | ICD-10-CM

## 2021-07-26 DIAGNOSIS — E785 Hyperlipidemia, unspecified: Secondary | ICD-10-CM

## 2021-07-26 DIAGNOSIS — E118 Type 2 diabetes mellitus with unspecified complications: Secondary | ICD-10-CM | POA: Diagnosis not present

## 2021-07-26 LAB — LIPID PANEL
Cholesterol: 170 mg/dL (ref 0–200)
HDL: 71.3 mg/dL (ref >39.00)
LDL Cholesterol: 85 mg/dL (ref 0–99)
NonHDL: 99.1
Total CHOL/HDL Ratio: 2
Triglycerides: 69 mg/dL (ref 0.0–149.0)
VLDL: 13.8 mg/dL (ref 0.0–40.0)

## 2021-07-26 LAB — HEMOGLOBIN A1C: Hgb A1c MFr Bld: 6.7 % — ABNORMAL HIGH (ref 4.6–6.5)

## 2021-07-26 NOTE — Assessment & Plan Note (Signed)
Foot exam done. Previously not controlled. She did not make changes after last visit so it is unclear what level of control she will have. May need to start daily insulin if HgA1c >9%. She is taking glipizide 2.5 mg daily and januvia 100 mg daily. She is on statin and ARB.

## 2021-07-26 NOTE — Progress Notes (Signed)
   Subjective:   [name], female    DOB: [birth year], 75 y.o.   MRN: [id number]  HPI The patient is a 75 YO female coming in for follow up medical conditions.   Review of Systems  Constitutional: Negative.   HENT: Negative.    Eyes: Negative.   Respiratory:  Negative for cough, chest tightness and shortness of breath.   Cardiovascular:  Negative for chest pain, palpitations and leg swelling.  Gastrointestinal:  Negative for abdominal distention, abdominal pain, constipation, diarrhea, nausea and vomiting.  Musculoskeletal: Negative.   Skin: Negative.   Neurological: Negative.   Psychiatric/Behavioral: Negative.     Objective:  Physical Exam Constitutional:      Appearance: She is well-developed.  HENT:     Head: Normocephalic and atraumatic.  Cardiovascular:     Rate and Rhythm: Normal rate and regular rhythm.  Pulmonary:     Effort: Pulmonary effort is normal. No respiratory distress.     Breath sounds: Normal breath sounds. No wheezing or rales.  Abdominal:     General: Bowel sounds are normal. There is no distension.     Palpations: Abdomen is soft.     Tenderness: There is no abdominal tenderness. There is no rebound.  Musculoskeletal:     Cervical back: Normal range of motion.  Skin:    General: Skin is warm and dry.     Comments: Foot exam done  Neurological:     Mental Status: She is alert and oriented to person, place, and time.     Coordination: Coordination normal.    Vitals:   07/26/21 1511  BP: 130/70  Pulse: (!) 54  Resp: 18  SpO2: 98%  Weight: 189 lb (85.7 kg)  Height: 5\' 3"  (1.6 m)    This visit occurred during the SARS-CoV-2 public health emergency.  Safety protocols were in place, including screening questions prior to the visit, additional usage of staff PPE, and extensive cleaning of exam room while observing appropriate contact time as indicated for disinfecting solutions.   Assessment & Plan:

## 2021-07-26 NOTE — Assessment & Plan Note (Addendum)
Checking lipid panel and she did start taking pitavastatin 1 mg daily since last visit and did not have any rash which she has gotten with other statins. Adjust dosing as needed.

## 2021-07-26 NOTE — Patient Instructions (Signed)
We will check the labs today. 

## 2021-07-26 NOTE — Assessment & Plan Note (Signed)
We did discuss during visit she had new stroke on CT imaging since last CT 2020 and likely had stroke in that time. We did discuss the importance of staying on statin if possible to lower risk as well as control of diabetes and blood pressure.

## 2021-08-12 ENCOUNTER — Telehealth: Payer: Self-pay | Admitting: Internal Medicine

## 2021-08-12 MED ORDER — PITAVASTATIN CALCIUM 1 MG PO TABS: 1.0000 mg | ORAL_TABLET | Freq: Every day | ORAL | 0 refills | Status: DC

## 2021-08-12 MED ORDER — LOSARTAN POTASSIUM-HCTZ 100-12.5 MG PO TABS: 1.0000 | ORAL_TABLET | Freq: Every day | ORAL | 0 refills | Status: DC

## 2021-08-12 MED ORDER — GLIPIZIDE ER 2.5 MG PO TB24: ORAL_TABLET | ORAL | 0 refills | Status: DC

## 2021-08-12 MED ORDER — FLUTICASONE FUROATE-VILANTEROL 200-25 MCG/ACT IN AEPB: 1.0000 | INHALATION_SPRAY | Freq: Every day | RESPIRATORY_TRACT | 11 refills | Status: DC

## 2021-08-12 MED ORDER — SITAGLIPTIN PHOSPHATE 100 MG PO TABS: 100.0000 mg | ORAL_TABLET | Freq: Every day | ORAL | 0 refills | Status: DC

## 2021-08-12 NOTE — Telephone Encounter (Signed)
Refills have been sent to the patient's pharmacy 

## 2021-08-12 NOTE — Telephone Encounter (Signed)
1.Medication Requested:  Pitavastatin Calcium 1 MG TABS  losartan-hydrochlorothiazide (HYZAAR) 100-12.5 MG tablet  sitaGLIPtin (JANUVIA) 100 MG tablet  fluticasone furoate-vilanterol (BREO ELLIPTA) 200-25 MCG/INH [facility]  glipiZIDE (GLIPIZIDE XL) 2.5 MG 24 hr tablet   2. Pharmacy (Name, [facility]): [place], [place]  Phone:  [phone] Fax:  [phone]   3. On Med List: yes  4. Last Visit with PCP: 10.07.22  5. Next visit date with PCP: n/a   Agent: Please be advised that RX refills may take up to 3 business days. We ask that you follow-up with your pharmacy.

## 2021-08-22 ENCOUNTER — Telehealth (INDEPENDENT_AMBULATORY_CARE_PROVIDER_SITE_OTHER): Payer: Medicare Other | Admitting: Family Medicine

## 2021-08-22 ENCOUNTER — Encounter: Payer: Self-pay | Admitting: Family Medicine

## 2021-08-22 VITALS — Temp 99.1°F

## 2021-08-22 DIAGNOSIS — R059 Cough, unspecified: Secondary | ICD-10-CM

## 2021-08-22 DIAGNOSIS — R509 Fever, unspecified: Secondary | ICD-10-CM

## 2021-08-22 DIAGNOSIS — J45901 Unspecified asthma with (acute) exacerbation: Secondary | ICD-10-CM | POA: Diagnosis not present

## 2021-08-22 MED ORDER — OSELTAMIVIR PHOSPHATE 75 MG PO CAPS: 75.0000 mg | ORAL_CAPSULE | Freq: Two times a day (BID) | ORAL | 0 refills | Status: DC

## 2021-08-22 MED ORDER — PREDNISONE 20 MG PO TABS: 40.0000 mg | ORAL_TABLET | Freq: Every day | ORAL | 0 refills | Status: DC

## 2021-08-22 MED ORDER — AZITHROMYCIN 250 MG PO TABS: ORAL_TABLET | ORAL | 0 refills | Status: DC
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
## 2021-08-22 NOTE — Patient Instructions (Addendum)
  HOME CARE TIPS:  -COVID19 testing information: [email or web address]  Most pharmacies also offer testing and home test kits. If the Covid19 test is positive and you desire antiviral treatment, please contact a [facility] or schedule a follow up virtual visit through your primary care office or through the [facility].  Other test to treat options: [email or web address].is?[email or web address]  -I sent the medication(s) we discussed to your pharmacy: Meds ordered this encounter  Medications   predniSONE (DELTASONE) 20 MG tablet    Sig: Take 2 tablets (40 mg total) by mouth daily with breakfast.    Dispense:  8 tablet    Refill:  0   azithromycin (ZITHROMAX) 250 MG tablet    Sig: 2 tabs day 1, then one tab daily    Dispense:  6 tablet    Refill:  0   oseltamivir (TAMIFLU) 75 MG capsule    Sig: Take 1 capsule (75 mg total) by mouth 2 (two) times daily.    Dispense:  10 capsule    Refill:  0    -start with the tamiflu and prednisone   -can use tylenol if needed for fevers, aches and pains per instructions  -can use nasal saline a few times per day if you have nasal congestion  -stay hydrated, drink plenty of fluids and eat small healthy meals - avoid dairy  -If the Covid test is positive, check out the [facility] website for more information on home care, transmission and treatment for COVID19  -follow up with your doctor in 2-3 days unless improving and feeling better  -stay home while sick, except to seek medical care. If you have COVID19, ideally it would be best to stay home for a full 10 days since the onset of symptoms PLUS one day of no fever and feeling better. Wear a good mask that fits snugly (such as N95 or KN95) if around others to reduce the risk of transmission.  It was nice to meet you today, and I really hope you are feeling better soon. I help [place] [facility] out with telemedicine visits on Tuesdays and Thursdays and am available  for visits on those days. If you have any concerns or questions following this visit please schedule a follow up visit with your Primary Care doctor or seek care at a local urgent care clinic to avoid delays in care.    Seek in person care or schedule a follow up video visit promptly if your symptoms worsen, new concerns arise or you are not improving with treatment. Call 911 and/or seek emergency care if your symptoms are severe or life threatening.

## 2021-09-24 ENCOUNTER — Ambulatory Visit (INDEPENDENT_AMBULATORY_CARE_PROVIDER_SITE_OTHER): Payer: Medicare Other

## 2021-09-24 ENCOUNTER — Other Ambulatory Visit: Payer: Self-pay

## 2021-09-24 DIAGNOSIS — Z0000 Encounter for general adult medical examination without abnormal findings: Secondary | ICD-10-CM | POA: Diagnosis not present
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
## 2021-11-01 ENCOUNTER — Ambulatory Visit (INDEPENDENT_AMBULATORY_CARE_PROVIDER_SITE_OTHER): Payer: Medicare Other | Admitting: Internal Medicine

## 2021-11-01 ENCOUNTER — Other Ambulatory Visit: Payer: Self-pay

## 2021-11-01 ENCOUNTER — Encounter: Payer: Self-pay | Admitting: Internal Medicine

## 2021-11-01 DIAGNOSIS — M25561 Pain in right knee: Secondary | ICD-10-CM | POA: Diagnosis not present

## 2021-11-01 MED ORDER — LIDOCAINE 5 % EX PTCH: 1.0000 | MEDICATED_PATCH | CUTANEOUS | 3 refills | Status: DC

## 2021-11-01 MED ORDER — PREDNISONE 20 MG PO TABS: 40.0000 mg | ORAL_TABLET | Freq: Every day | ORAL | 0 refills | Status: DC

## 2021-11-01 NOTE — Progress Notes (Signed)
° °  Subjective:   [name], female    DOB: 1946-07-24, 76 y.o.   MRN: [id number]  HPI The patient is a 76 YO female coming in for acute right knee pain. No injury about 1 week ago. Poor memory so unclear history. Daughter present and helps to provide history may be improving slightly. Taking tylenol at night but this makes her very drowsy cannot take during day.   Review of Systems  Constitutional: Negative.   HENT: Negative.    Eyes: Negative.   Respiratory:  Negative for cough, chest tightness and shortness of breath.   Cardiovascular:  Negative for chest pain, palpitations and leg swelling.  Gastrointestinal:  Negative for abdominal distention, abdominal pain, constipation, diarrhea, nausea and vomiting.  Musculoskeletal:  Positive for arthralgias, joint swelling and myalgias.  Skin: Negative.   Neurological: Negative.   Psychiatric/Behavioral: Negative.     Objective:  Physical Exam Constitutional:      Appearance: She is well-developed.  HENT:     Head: Normocephalic and atraumatic.  Cardiovascular:     Rate and Rhythm: Normal rate and regular rhythm.  Pulmonary:     Effort: Pulmonary effort is normal. No respiratory distress.     Breath sounds: Normal breath sounds. No wheezing or rales.  Abdominal:     General: Bowel sounds are normal. There is no distension.     Palpations: Abdomen is soft.     Tenderness: There is no abdominal tenderness. There is no rebound.  Musculoskeletal:        General: Swelling and tenderness present.     Cervical back: Normal range of motion.     Comments: Right knee with medial swelling and pain LCL and MCL, ACL intact  Skin:    General: Skin is warm and dry.  Neurological:     Mental Status: She is alert and oriented to person, place, and time.     Coordination: Coordination normal.    Vitals:   11/01/21 1104  BP: 126/80  Pulse: (!) 44  Resp: 18  SpO2: 96%  Weight: 187 lb 3.2 oz (84.9 kg)  Height: 5\' 3"  (1.6 m)     This visit occurred during the SARS-CoV-2 public health emergency.  Safety protocols were in place, including screening questions prior to the visit, additional usage of staff PPE, and extensive cleaning of exam room while observing appropriate contact time as indicated for disinfecting solutions.   Assessment & Plan:

## 2021-11-01 NOTE — Assessment & Plan Note (Signed)
Rx prednisone 5 day. Offered injection which she declines. Rx lidocaine patches for pain.

## 2021-11-01 NOTE — Patient Instructions (Signed)
We have sent in prednisone to take 2 pills daily for 5 days.  We have refilled the lidocaine patches to use for pain. It is okay to use tylenol or ibuprofen as well.

## 2021-11-14 ENCOUNTER — Telehealth: Payer: Self-pay | Admitting: Internal Medicine

## 2021-11-14 NOTE — Telephone Encounter (Signed)
1.Medication Requested: Pitavastatin Calcium 1 MG TABS  glipiZIDE (GLIPIZIDE XL) 2.5 MG 24 hr tablet  losartan-hydrochlorothiazide (HYZAAR) 100-12.5 MG tablet  sitaGLIPtin (JANUVIA) 100 MG tablet  fluticasone furoate-vilanterol (BREO ELLIPTA) 200-25 MCG/ACT AEPB  lidocaine (LIDODERM) 5 %    2. Pharmacy (Name, [place], City):[facility] - [place], [place] - [address]  3. On Med List: yes  4. Last Visit with PCP: 11-01-2021  5. Next visit date with PCP: n/a   Agent: Please be advised that RX refills may take up to 3 business days. We ask that you follow-up with your pharmacy.

## 2021-11-19 MED ORDER — FLUTICASONE FUROATE-VILANTEROL 200-25 MCG/ACT IN AEPB: 1.0000 | INHALATION_SPRAY | Freq: Every day | RESPIRATORY_TRACT | 11 refills | Status: DC

## 2021-11-19 MED ORDER — GLIPIZIDE ER 2.5 MG PO TB24: ORAL_TABLET | ORAL | 0 refills | Status: DC

## 2021-11-19 MED ORDER — SITAGLIPTIN PHOSPHATE 100 MG PO TABS: 100.0000 mg | ORAL_TABLET | Freq: Every day | ORAL | 0 refills | Status: DC

## 2021-11-19 MED ORDER — LOSARTAN POTASSIUM-HCTZ 100-12.5 MG PO TABS: 1.0000 | ORAL_TABLET | Freq: Every day | ORAL | 0 refills | Status: DC

## 2021-11-19 MED ORDER — PITAVASTATIN CALCIUM 1 MG PO TABS: 1.0000 mg | ORAL_TABLET | Freq: Every day | ORAL | 0 refills | Status: DC

## 2021-11-19 NOTE — Telephone Encounter (Signed)
Refills have been sent to the pt's pharmacy  

## 2022-01-25 ENCOUNTER — Encounter: Payer: Self-pay | Admitting: Internal Medicine

## 2022-01-27 MED ORDER — PREDNISONE 10 MG PO TABS: ORAL_TABLET | ORAL | 0 refills | Status: DC

## 2022-01-28 ENCOUNTER — Ambulatory Visit (INDEPENDENT_AMBULATORY_CARE_PROVIDER_SITE_OTHER): Payer: Medicare Other | Admitting: Family Medicine

## 2022-01-28 ENCOUNTER — Encounter (HOSPITAL_COMMUNITY): Payer: Self-pay

## 2022-01-28 ENCOUNTER — Emergency Department (HOSPITAL_COMMUNITY)
Admission: EM | Admit: 2022-01-28 | Discharge: 2022-01-28 | Disposition: A | Discharge status: Home / Self Care | Payer: Medicare Other | Attending: Student | Admitting: Student

## 2022-01-28 ENCOUNTER — Other Ambulatory Visit: Payer: Self-pay

## 2022-01-28 ENCOUNTER — Emergency Department (HOSPITAL_COMMUNITY): Payer: Medicare Other

## 2022-01-28 ENCOUNTER — Encounter: Payer: Self-pay | Admitting: Family Medicine

## 2022-01-28 VITALS — BP 130/80 | HR 34 | Temp 97.6°F | Ht 63.0 in | Wt 188.0 lb

## 2022-01-28 DIAGNOSIS — D72829 Elevated white blood cell count, unspecified: Secondary | ICD-10-CM | POA: Diagnosis not present

## 2022-01-28 DIAGNOSIS — J4531 Mild persistent asthma with (acute) exacerbation: Secondary | ICD-10-CM

## 2022-01-28 DIAGNOSIS — Z91040 Latex allergy status: Secondary | ICD-10-CM | POA: Insufficient documentation

## 2022-01-28 DIAGNOSIS — F0390 Unspecified dementia without behavioral disturbance: Secondary | ICD-10-CM | POA: Diagnosis not present

## 2022-01-28 DIAGNOSIS — R0602 Shortness of breath: Secondary | ICD-10-CM | POA: Diagnosis not present

## 2022-01-28 DIAGNOSIS — R001 Bradycardia, unspecified: Secondary | ICD-10-CM

## 2022-01-28 DIAGNOSIS — I491 Atrial premature depolarization: Secondary | ICD-10-CM | POA: Diagnosis not present

## 2022-01-28 DIAGNOSIS — J4530 Mild persistent asthma, uncomplicated: Secondary | ICD-10-CM | POA: Insufficient documentation

## 2022-01-28 DIAGNOSIS — E119 Type 2 diabetes mellitus without complications: Secondary | ICD-10-CM | POA: Diagnosis not present

## 2022-01-28 DIAGNOSIS — I10 Essential (primary) hypertension: Secondary | ICD-10-CM | POA: Insufficient documentation

## 2022-01-28 DIAGNOSIS — J45909 Unspecified asthma, uncomplicated: Secondary | ICD-10-CM

## 2022-01-28 DIAGNOSIS — Z91010 Allergy to peanuts: Secondary | ICD-10-CM | POA: Diagnosis not present

## 2022-01-28 DIAGNOSIS — Z7951 Long term (current) use of inhaled steroids: Secondary | ICD-10-CM | POA: Insufficient documentation

## 2022-01-28 DIAGNOSIS — Z7984 Long term (current) use of oral hypoglycemic drugs: Secondary | ICD-10-CM | POA: Insufficient documentation

## 2022-01-28 DIAGNOSIS — R059 Cough, unspecified: Secondary | ICD-10-CM | POA: Insufficient documentation

## 2022-01-28 DIAGNOSIS — J45901 Unspecified asthma with (acute) exacerbation: Secondary | ICD-10-CM | POA: Diagnosis not present

## 2022-01-28 DIAGNOSIS — I517 Cardiomegaly: Secondary | ICD-10-CM | POA: Diagnosis not present

## 2022-01-28 DIAGNOSIS — R0603 Acute respiratory distress: Secondary | ICD-10-CM

## 2022-01-28 LAB — COMPREHENSIVE METABOLIC PANEL
ALT: 23 U/L (ref 0–44)
AST: 25 U/L (ref 15–41)
Albumin: 3.6 g/dL (ref 3.5–5.0)
Alkaline Phosphatase: 77 U/L (ref 38–126)
Anion gap: 10 (ref 5–15)
BUN: 16 mg/dL (ref 8–23)
CO2: 29 mmol/L (ref 22–32)
Calcium: 9 mg/dL (ref 8.9–10.3)
Chloride: 101 mmol/L (ref 98–111)
Creatinine, Ser: 0.95 mg/dL (ref 0.44–1.00)
GFR, Estimated: >60 mL/min (ref >60)
Glucose, Bld: 193 mg/dL — ABNORMAL HIGH (ref 70–99)
Potassium: 3.6 mmol/L (ref 3.5–5.1)
Sodium: 140 mmol/L (ref 135–145)
Total Bilirubin: 0.7 mg/dL (ref 0.3–1.2)
Total Protein: 6.2 g/dL — ABNORMAL LOW (ref 6.5–8.1)

## 2022-01-28 LAB — CBC WITH DIFFERENTIAL/PLATELET
Abs Immature Granulocytes: 0.06 10*3/uL (ref 0.00–0.07)
Basophils Absolute: 0.1 10*3/uL (ref 0.0–0.1)
Basophils Relative: 1 %
Eosinophils Absolute: 0.7 10*3/uL — ABNORMAL HIGH (ref 0.0–0.5)
Eosinophils Relative: 6 %
HCT: 41.3 % (ref 36.0–46.0)
Hemoglobin: 13.4 g/dL (ref 12.0–15.0)
Immature Granulocytes: 1 %
Lymphocytes Relative: 25 %
Lymphs Abs: 3.2 10*3/uL (ref 0.7–4.0)
MCH: 28.2 pg (ref 26.0–34.0)
MCHC: 32.4 g/dL (ref 30.0–36.0)
MCV: 86.8 fL (ref 80.0–100.0)
Monocytes Absolute: 1.2 10*3/uL — ABNORMAL HIGH (ref 0.1–1.0)
Monocytes Relative: 9 %
Neutro Abs: 7.7 10*3/uL (ref 1.7–7.7)
Neutrophils Relative %: 58 %
Platelets: 191 10*3/uL (ref 150–400)
RBC: 4.76 MIL/uL (ref 3.87–5.11)
RDW: 13 % (ref 11.5–15.5)
WBC: 13 10*3/uL — ABNORMAL HIGH (ref 4.0–10.5)
nRBC: 0 % (ref 0.0–0.2)

## 2022-01-28 LAB — TROPONIN I (HIGH SENSITIVITY)
Troponin I (High Sensitivity): 13 ng/L (ref <18)
Troponin I (High Sensitivity): 12 ng/L (ref <18)

## 2022-01-28 LAB — BRAIN NATRIURETIC PEPTIDE: B Natriuretic Peptide: 55.8 pg/mL (ref 0.0–100.0)

## 2022-01-28 MED ORDER — METHYLPREDNISOLONE SODIUM SUCC 125 MG IJ SOLR
125.0000 mg | Freq: Once | INTRAMUSCULAR | Status: AC
  Administered 2022-01-28: 125 mg via INTRAVENOUS
  Filled 2022-01-28: qty 2

## 2022-01-28 MED ORDER — ALBUTEROL SULFATE (2.5 MG/3ML) 0.083% IN NEBU
7.5000 mg/h | INHALATION_SOLUTION | Freq: Once | RESPIRATORY_TRACT | Status: AC
  Administered 2022-01-28: 7.5 mg/h via RESPIRATORY_TRACT
  Filled 2022-01-28: qty 3

## 2022-01-28 MED ORDER — IPRATROPIUM-ALBUTEROL 0.5-2.5 (3) MG/3ML IN SOLN
3.0000 mL | Freq: Once | RESPIRATORY_TRACT | Status: AC
Start: 2022-01-28 — End: 2022-01-28
  Administered 2022-01-28: 3 mL via RESPIRATORY_TRACT
  Filled 2022-01-28: qty 3

## 2022-01-28 MED ORDER — IPRATROPIUM-ALBUTEROL 0.5-2.5 (3) MG/3ML IN SOLN
6.0000 mL | Freq: Once | RESPIRATORY_TRACT | Status: AC
  Administered 2022-01-28: 6 mL via RESPIRATORY_TRACT
  Filled 2022-01-28: qty 3

## 2022-01-28 MED ORDER — PREDNISONE 10 MG (21) PO TBPK: ORAL_TABLET | Freq: Every day | ORAL | 0 refills | Status: DC

## 2022-01-28 NOTE — Discharge Instructions (Signed)
For your asthma, please follow-up with your primary care doctor in the next 24 to 48 hours for close recheck.  For the next 24 hours I recommend doing your nebulized treatments every 4 hours while awake and then space out as needed.  Take the steroids.  If you feel that you are having worsening breathing despite this intervention, please return to the emergency room for reassessment. ? ?For your slow heart rate, follow-up with cardiology.  If you have any episodes of lightheadedness, passing out spells or other new concerning symptom, come back to ER for reassessment. ?

## 2022-01-28 NOTE — ED Notes (Signed)
X-ray at bedside

## 2022-01-28 NOTE — Progress Notes (Signed)
? ?  Subjective:  ? ? [name], female    DOB: 1945-12-06, 76 y.o.   MRN: [id number] ? ?HPI ?Chief Complaint  ?Patient presents with  ? Allergies  ?  Allergy flare up, SOB, coughing & wheezing  ? ?She is a 76 year old female with history of asthma, allergies, diabetes, hypertension and hyperlipidemia who is here today with complaints of shortness of breath, wheezing, dizziness and fatigue. ?Her daughter is with her. ? ?Pulse today in the 30s without a history of the same.  She is not on a beta-blocker. ? ?Denies fever, chills, headache, chest pain, palpitations, abdominal pain, nausea, vomiting or diarrhea. ?She does note some lower extremity edema which is not new. ? ? ?Review of Systems ?Pertinent positives and negatives in the history of present illness. ? ?   ?Objective:  ? Physical Exam ?Constitutional:   ?   General: She is in acute distress.  ?   Appearance: Normal appearance. She is ill-appearing. She is not diaphoretic.  ?Eyes:  ?   Conjunctiva/sclera: Conjunctivae normal.  ?   Pupils: Pupils are equal, round, and reactive to light.  ?Cardiovascular:  ?   Rate and Rhythm: Regular rhythm. Bradycardia present.  ?Skin: ?   General: Skin is warm and dry.  ?Neurological:  ?   Mental Status: She is alert. Mental status is at baseline.  ?   Cranial Nerves: No cranial nerve deficit.  ? ?BP 130/80 (BP Location: Left Arm, Patient Position: Sitting, Cuff Size: Large)   Pulse (!) 34   Temp 97.6 ?F (36.4 ?C) (Temporal)   Ht '5\' 3"'$  (1.6 m)   Wt 188 lb (85.3 kg)   LMP 03/19/1986   SpO2 98%   BMI 33.30 kg/m?  ? ? ?   ?Assessment & Plan:  ?Bradycardia with 31-40 beats per minute - Plan: EKG 12-Lead ? ?Mild persistent asthma with acute exacerbation ? ?Acute respiratory distress - Plan: EKG 12-Lead ? ?She appears to be in acute respiratory distress. ?EKG shows sinus bradycardia, rate 56, LVH ?Oxygen 2 L nasal cannula applied. ?EMS called for transport to the emergency department for further evaluation and  treatment. ? ? ?

## 2022-01-28 NOTE — ED Triage Notes (Signed)
Pt BIB [facility] from [facility] d/t SB. She initially went to her PCP with c/o worsening cough & SOB. While there she was found to be dizzy & SB, Low of 40 bpm & High of 48 bpm. Staff palpated 39 Radial pulse. Alert to self with confusion at baseline secondary to Hx of stroke. PCP reports this happened oin the past while she was on a Beta blocker & was taken off, then this happened today. 190/70, 100% on RA. 20g PIV in Lt AC. ?
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
## 2022-01-31 ENCOUNTER — Telehealth: Payer: Self-pay

## 2022-01-31 NOTE — Telephone Encounter (Signed)
Fine to refill 

## 2022-01-31 NOTE — Telephone Encounter (Signed)
Pt daughter is requesting a refill on: ?lidocaine (LIDODERM) 5 % ? ?Pharmacy: ?[facility] [place], [place] - [address] ? ?LOV 11/01/21 ?ROV 02/04/22 ?

## 2022-02-03 MED ORDER — LIDOCAINE 5 % EX PTCH: 1.0000 | MEDICATED_PATCH | CUTANEOUS | 3 refills | Status: DC

## 2022-02-03 NOTE — Telephone Encounter (Signed)
Refill has been sent to the pt's pharmacy  

## 2022-02-04 ENCOUNTER — Encounter: Payer: Self-pay | Admitting: Internal Medicine

## 2022-02-04 ENCOUNTER — Ambulatory Visit (INDEPENDENT_AMBULATORY_CARE_PROVIDER_SITE_OTHER): Payer: Medicare Other | Admitting: Internal Medicine

## 2022-02-04 ENCOUNTER — Ambulatory Visit (INDEPENDENT_AMBULATORY_CARE_PROVIDER_SITE_OTHER): Payer: Medicare Other

## 2022-02-04 VITALS — BP 124/84 | HR 44 | Resp 18 | Ht 63.0 in | Wt 183.4 lb

## 2022-02-04 DIAGNOSIS — R001 Bradycardia, unspecified: Secondary | ICD-10-CM | POA: Diagnosis not present

## 2022-02-04 DIAGNOSIS — I517 Cardiomegaly: Secondary | ICD-10-CM

## 2022-02-04 DIAGNOSIS — E118 Type 2 diabetes mellitus with unspecified complications: Secondary | ICD-10-CM

## 2022-02-04 DIAGNOSIS — J4531 Mild persistent asthma with (acute) exacerbation: Secondary | ICD-10-CM | POA: Diagnosis not present

## 2022-02-04 MED ORDER — DOXYCYCLINE HYCLATE 100 MG PO TABS: 100.0000 mg | ORAL_TABLET | Freq: Two times a day (BID) | ORAL | 0 refills | Status: DC

## 2022-02-04 MED ORDER — PREDNISONE 10 MG (21) PO TBPK: ORAL_TABLET | Freq: Every day | ORAL | 0 refills | Status: DC

## 2022-02-04 NOTE — Progress Notes (Signed)
? ?  Subjective:  ? ?[name], female    DOB: Dec 26, 1945, 76 y.o.   MRN: [id number] ? ?HPI ?The patient is a 76 YO female coming in for ER follow up with ongoing concerns. HR still low when they are checking it at home. Oxygen levels still variable and using nebulizer every 4 hours scheduled while awake. Denies chest pains.  ? ?PMH, [facility], social history reviewed and updated ? ?Review of Systems  ?Constitutional:  Positive for activity change, appetite change and fatigue. Negative for fever.  ?HENT:  Positive for congestion.   ?Eyes: Negative.   ?Respiratory:  Positive for cough, chest tightness, shortness of breath and wheezing.   ?Cardiovascular:  Negative for chest pain, palpitations and leg swelling.  ?Gastrointestinal:  Negative for abdominal distention, abdominal pain, constipation, diarrhea, nausea and vomiting.  ?Musculoskeletal:  Positive for arthralgias.  ?Skin: Negative.   ?Neurological: Negative.   ?Psychiatric/Behavioral: Negative.    ? ?Objective:  ?Physical Exam ?Constitutional:   ?   Appearance: She is well-developed.  ?HENT:  ?   Head: Normocephalic and atraumatic.  ?Cardiovascular:  ?   Rate and Rhythm: Normal rate and regular rhythm.  ?Pulmonary:  ?   Effort: Pulmonary effort is normal. No respiratory distress.  ?   Breath sounds: Wheezing present. No rales.  ?   Comments: Some wheezing, decent airflow throughout lungs which is improvement from prior. No focal rhonchi or rales on exam ?Abdominal:  ?   General: Bowel sounds are normal. There is no distension.  ?   Palpations: Abdomen is soft.  ?   Tenderness: There is no abdominal tenderness. There is no rebound.  ?Musculoskeletal:  ?   Cervical back: Normal range of motion.  ?Skin: ?   General: Skin is warm and dry.  ?Neurological:  ?   Mental Status: She is alert and oriented to person, place, and time.  ?   Coordination: Coordination normal.  ? ? ?Vitals:  ? 02/04/22 1102  ?BP: 124/84  ?Pulse: (!) 44  ?Resp: 18  ?SpO2: 98%  ?Weight:  183 lb 6.4 oz (83.2 kg)  ?Height: '5\' 3"'$  (1.6 m)  ? ? ?This visit occurred during the SARS-CoV-2 public health emergency.  Safety protocols were in place, including screening questions prior to the visit, additional usage of staff PPE, and extensive cleaning of exam room while observing appropriate contact time as indicated for disinfecting solutions.  ? ?Assessment & Plan:  ?Visit time 25 minutes in face to face communication with patient and coordination of care, additional 15 minutes spent in record review, coordination or care, ordering tests, communicating/referring to other healthcare professionals, documenting in medical records all on the same day of the visit for total time 40 minutes spent on the visit.  ? ?

## 2022-02-04 NOTE — Progress Notes (Unsigned)
Enrolled for [employer] to mail a ZIO XT long term holter monitor to the patients address on file.  

## 2022-02-04 NOTE — Patient Instructions (Signed)
We have sent in the antibiotics doxycycline to take. ? ?We have sent in the prednisone to help if needed. ? ?We will do the heart monitor and the ultrasound (echo) of the heart to check why the heart rate is low. ?

## 2022-02-05 DIAGNOSIS — R001 Bradycardia, unspecified: Secondary | ICD-10-CM | POA: Insufficient documentation

## 2022-02-05 DIAGNOSIS — I517 Cardiomegaly: Secondary | ICD-10-CM | POA: Insufficient documentation

## 2022-02-05 NOTE — Assessment & Plan Note (Signed)
She is improved slightly from ER visit after round of prednisone. Reviewed CXR with patient and daughter and no clear infection however her cough and lung exam today is hard to distinguish given severe asthma flare. Will renew prednisone to complete another taper if breathing worsens off prednisone (they will stop tomorrow). Rx doxycycline to do to cover for infection. Continue breo and albuterol nebulizer and inhaler.  ?

## 2022-02-05 NOTE — Assessment & Plan Note (Signed)
Persistent bradycardia into the 40s today with lowest 31 at the office last week. Given that she is doing nebulizer albuterol 4-6 times per day I would expect some tachycardia from that. She is not on any rate suppressing medications. EKGs done several times recently without heart block. She is fatigued but BP normal and no clear other symptoms from the bradycardia. I have ordered holter monitor as she will need referral to cardiology for potential pacemaker is she is HR 30s or low 40s consistently.  ?

## 2022-02-05 NOTE — Assessment & Plan Note (Signed)
Noted on several CXRs in the past and she is having SOB on exertion. This is likely related to her asthma exacerbation however she has had this even between flares. Will get echo to clarify cardiac function especially given her recent bradycardia to ensure no structural changes. If abnormality will refer to cardiology.  ?

## 2022-02-05 NOTE — Assessment & Plan Note (Signed)
We did discuss how the prednisone courses will likely increase her sugar levels temporarily and if >250 let us know and we can adjust regimen. Watch diet closely while on prednisone. ?

## 2022-02-06 DIAGNOSIS — R001 Bradycardia, unspecified: Secondary | ICD-10-CM | POA: Diagnosis not present

## 2022-02-18 ENCOUNTER — Ambulatory Visit (HOSPITAL_COMMUNITY): Payer: Medicare Other | Attending: Internal Medicine

## 2022-02-18 DIAGNOSIS — I517 Cardiomegaly: Secondary | ICD-10-CM

## 2022-02-18 LAB — ECHOCARDIOGRAM COMPLETE
AR max vel: 1.25 cm2
AV Area VTI: 1.3 cm2
AV Area mean vel: 1.35 cm2
AV Mean grad: 11 mmHg
AV Peak grad: 23.2 mmHg
Ao pk vel: 2.41 m/s
Area-P 1/2: 3.13 cm2
S' Lateral: 2.7 cm

## 2022-02-21 DIAGNOSIS — R001 Bradycardia, unspecified: Secondary | ICD-10-CM | POA: Diagnosis not present

## 2022-02-27 ENCOUNTER — Other Ambulatory Visit: Payer: Self-pay | Admitting: Internal Medicine

## 2022-03-05 ENCOUNTER — Other Ambulatory Visit: Payer: Self-pay | Admitting: Internal Medicine

## 2022-03-05 DIAGNOSIS — Z1231 Encounter for screening mammogram for malignant neoplasm of breast: Secondary | ICD-10-CM

## 2022-03-18 DIAGNOSIS — H04123 Dry eye syndrome of bilateral lacrimal glands: Secondary | ICD-10-CM | POA: Diagnosis not present

## 2022-03-18 DIAGNOSIS — H35031 Hypertensive retinopathy, right eye: Secondary | ICD-10-CM | POA: Diagnosis not present

## 2022-03-18 DIAGNOSIS — E119 Type 2 diabetes mellitus without complications: Secondary | ICD-10-CM | POA: Diagnosis not present

## 2022-03-18 DIAGNOSIS — H25813 Combined forms of age-related cataract, bilateral: Secondary | ICD-10-CM | POA: Diagnosis not present

## 2022-03-18 DIAGNOSIS — H524 Presbyopia: Secondary | ICD-10-CM | POA: Diagnosis not present

## 2022-03-18 DIAGNOSIS — H40013 Open angle with borderline findings, low risk, bilateral: Secondary | ICD-10-CM | POA: Diagnosis not present

## 2022-03-18 LAB — HM DIABETES EYE EXAM

## 2022-04-08 ENCOUNTER — Ambulatory Visit
Admission: RE | Admit: 2022-04-08 | Discharge: 2022-04-08 | Disposition: A | Discharge status: Home / Self Care | Payer: Medicare Other | Source: Ambulatory Visit | Attending: Internal Medicine | Admitting: Internal Medicine

## 2022-04-08 DIAGNOSIS — Z1231 Encounter for screening mammogram for malignant neoplasm of breast: Secondary | ICD-10-CM

## 2022-04-16 ENCOUNTER — Telehealth: Payer: Self-pay | Admitting: Internal Medicine

## 2022-04-16 MED ORDER — SITAGLIPTIN PHOSPHATE 100 MG PO TABS: 100.0000 mg | ORAL_TABLET | Freq: Every day | ORAL | 0 refills | Status: DC

## 2022-04-16 MED ORDER — FLUTICASONE FUROATE-VILANTEROL 200-25 MCG/ACT IN AEPB: 1.0000 | INHALATION_SPRAY | Freq: Every day | RESPIRATORY_TRACT | 0 refills | Status: DC

## 2022-04-16 MED ORDER — GLIPIZIDE ER 2.5 MG PO TB24: ORAL_TABLET | ORAL | 0 refills | Status: DC

## 2022-04-16 MED ORDER — FLUTICASONE FUROATE-VILANTEROL 200-25 MCG/ACT IN AEPB: 1.0000 | INHALATION_SPRAY | Freq: Every day | RESPIRATORY_TRACT | 11 refills | Status: DC

## 2022-04-16 NOTE — Telephone Encounter (Signed)
Refill has been sent to the pt's mail order pharmacy. ?

## 2022-04-16 NOTE — Telephone Encounter (Signed)
Patient needs the follow prescriptions called in - Losartan, Breo, glipizide, Januvia,   Please send to [facility] by mail

## 2022-05-20 ENCOUNTER — Encounter (HOSPITAL_COMMUNITY): Payer: Self-pay

## 2022-05-20 ENCOUNTER — Ambulatory Visit (HOSPITAL_COMMUNITY)
Admission: RE | Admit: 2022-05-20 | Discharge: 2022-05-20 | Disposition: A | Discharge status: Home / Self Care | Payer: Medicare Other | Source: Ambulatory Visit | Attending: Emergency Medicine | Admitting: Emergency Medicine

## 2022-05-20 VITALS — BP 174/82 | HR 57 | Temp 99.7°F | Resp 18

## 2022-05-20 DIAGNOSIS — B309 Viral conjunctivitis, unspecified: Secondary | ICD-10-CM | POA: Diagnosis not present

## 2022-05-20 MED ORDER — TOBRAMYCIN 0.3 % OP SOLN: 1.0000 [drp] | OPHTHALMIC | 0 refills | Status: DC

## 2022-05-20 NOTE — Discharge Instructions (Addendum)
Continue using Patanol per package instructions.  If, in a few days, symptoms do not improve with the Patanol, or if the discharge from the eye changes to thick, green or yellow drainage, pick up the prescription of antibiotic eyedrops.  Pamela Morgan can attend her day center activities as long as she does not touch her eyes, or if she touches her eyes, she immediately washes her hands.  Blood pressure is still high on recheck but better.  Make sure Pamela Morgan takes her blood pressure medicines every day.

## 2022-05-20 NOTE — ED Triage Notes (Signed)
Pt reports bilat eye redness since Sunday. Crusting in mornings. Tried OTC drops. Pt reports itching and burning.
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
## 2022-05-27 DIAGNOSIS — B309 Viral conjunctivitis, unspecified: Secondary | ICD-10-CM | POA: Diagnosis not present

## 2022-06-16 ENCOUNTER — Encounter: Payer: Self-pay | Admitting: Internal Medicine

## 2022-06-16 DIAGNOSIS — F02B18 Dementia in other diseases classified elsewhere, moderate, with other behavioral disturbance: Secondary | ICD-10-CM

## 2022-06-18 NOTE — Telephone Encounter (Signed)
Patient's daughter called back and is still enquiring about additional care for her mother.

## 2022-06-30 ENCOUNTER — Telehealth: Payer: Self-pay | Admitting: Internal Medicine

## 2022-06-30 NOTE — Telephone Encounter (Signed)
PT's daughter visits today with forms to be filled out. Forms are currently in [place]. PT's daughter would like to be notified once these forms are filled. I did inform PT that paperwork can take 7-10 days to be filled, and PT informed me that the dead line for it would be this Friday.  CB: [phone]

## 2022-07-02 NOTE — Telephone Encounter (Signed)
Patient's daughter states that patient cannot go back to day program without paperwork. She wants to know when she can expect to pick it up.

## 2022-07-03 NOTE — Telephone Encounter (Signed)
Notified pt daughter-Adult Day Care/Health Medical Exam form is ready to pick at the front office.

## 2022-07-07 ENCOUNTER — Encounter: Payer: Self-pay | Admitting: Internal Medicine

## 2022-07-07 ENCOUNTER — Ambulatory Visit (INDEPENDENT_AMBULATORY_CARE_PROVIDER_SITE_OTHER): Payer: Medicare Other | Admitting: Internal Medicine

## 2022-07-07 VITALS — BP 162/86 | HR 47 | Ht 63.0 in | Wt 185.0 lb

## 2022-07-07 DIAGNOSIS — E785 Hyperlipidemia, unspecified: Secondary | ICD-10-CM | POA: Diagnosis not present

## 2022-07-07 DIAGNOSIS — F02B18 Dementia in other diseases classified elsewhere, moderate, with other behavioral disturbance: Secondary | ICD-10-CM | POA: Diagnosis not present

## 2022-07-07 DIAGNOSIS — E118 Type 2 diabetes mellitus with unspecified complications: Secondary | ICD-10-CM

## 2022-07-07 DIAGNOSIS — E1169 Type 2 diabetes mellitus with other specified complication: Secondary | ICD-10-CM

## 2022-07-07 DIAGNOSIS — R55 Syncope and collapse: Secondary | ICD-10-CM

## 2022-07-07 DIAGNOSIS — G300 Alzheimer's disease with early onset: Secondary | ICD-10-CM | POA: Diagnosis not present

## 2022-07-07 DIAGNOSIS — I10 Essential (primary) hypertension: Secondary | ICD-10-CM

## 2022-07-07 LAB — COMPREHENSIVE METABOLIC PANEL
ALT: 17 U/L (ref 0–35)
AST: 18 U/L (ref 0–37)
Albumin: 4 g/dL (ref 3.5–5.2)
Alkaline Phosphatase: 80 U/L (ref 39–117)
BUN: 12 mg/dL (ref 6–23)
CO2: 31 mEq/L (ref 19–32)
Calcium: 9.5 mg/dL (ref 8.4–10.5)
Chloride: 101 mEq/L (ref 96–112)
Creatinine, Ser: 0.92 mg/dL (ref 0.40–1.20)
GFR: 60.85 mL/min (ref >60.00)
Glucose, Bld: 195 mg/dL — ABNORMAL HIGH (ref 70–99)
Potassium: 3.4 mEq/L — ABNORMAL LOW (ref 3.5–5.1)
Sodium: 141 mEq/L (ref 135–145)
Total Bilirubin: 0.7 mg/dL (ref 0.2–1.2)
Total Protein: 7.1 g/dL (ref 6.0–8.3)

## 2022-07-07 LAB — LIPID PANEL
Cholesterol: 206 mg/dL — ABNORMAL HIGH (ref 0–200)
HDL: 55.9 mg/dL (ref >39.00)
LDL Cholesterol: 133 mg/dL — ABNORMAL HIGH (ref 0–99)
NonHDL: 150.34
Total CHOL/HDL Ratio: 4
Triglycerides: 86 mg/dL (ref 0.0–149.0)
VLDL: 17.2 mg/dL (ref 0.0–40.0)

## 2022-07-07 LAB — CBC
HCT: 40.7 % (ref 36.0–46.0)
Hemoglobin: 13.5 g/dL (ref 12.0–15.0)
MCHC: 33.1 g/dL (ref 30.0–36.0)
MCV: 84.9 fl (ref 78.0–100.0)
Platelets: 199 10*3/uL (ref 150.0–400.0)
RBC: 4.79 Mil/uL (ref 3.87–5.11)
RDW: 13.9 % (ref 11.5–15.5)
WBC: 9.4 10*3/uL (ref 4.0–10.5)

## 2022-07-07 LAB — HEMOGLOBIN A1C: Hgb A1c MFr Bld: 8.2 % — ABNORMAL HIGH (ref 4.6–6.5)

## 2022-07-07 MED ORDER — GLIPIZIDE ER 2.5 MG PO TB24: ORAL_TABLET | ORAL | 3 refills | Status: DC

## 2022-07-07 MED ORDER — LORAZEPAM 0.5 MG PO TABS: 0.5000 mg | ORAL_TABLET | Freq: Two times a day (BID) | ORAL | 1 refills | Status: DC | PRN

## 2022-07-07 MED ORDER — LOSARTAN POTASSIUM-HCTZ 100-12.5 MG PO TABS: 1.0000 | ORAL_TABLET | Freq: Every day | ORAL | 3 refills | Status: DC

## 2022-07-07 MED ORDER — MIRTAZAPINE 15 MG PO TABS: 15.0000 mg | ORAL_TABLET | Freq: Every day | ORAL | 3 refills | Status: DC

## 2022-07-07 NOTE — Patient Instructions (Addendum)
We will send in mitazpaine to take in the evening to help level the mood.  We have sent in lorazepam to use if needed only for upset.  We will check the labs today.

## 2022-07-07 NOTE — Progress Notes (Unsigned)
   Subjective:   [name], female    DOB: 07/02/1946, 76 y.o.   MRN: [id number]  HPI The patient is a 76 YO female coming in for memory concerns. Prior CT head 4/22 with old stroke and chronic small vessel disease. Worsening and behavioral issues.   Review of Systems  Unable to perform ROS: Dementia    Objective:  Physical Exam Constitutional:      Appearance: She is well-developed.  HENT:     Head: Normocephalic and atraumatic.  Cardiovascular:     Rate and Rhythm: Normal rate and regular rhythm.  Pulmonary:     Effort: Pulmonary effort is normal. No respiratory distress.     Breath sounds: Normal breath sounds. No wheezing or rales.  Abdominal:     General: Bowel sounds are normal. There is no distension.     Palpations: Abdomen is soft.     Tenderness: There is no abdominal tenderness. There is no rebound.  Musculoskeletal:     Cervical back: Normal range of motion.  Skin:    General: Skin is warm and dry.  Neurological:     Mental Status: She is alert.     Coordination: Coordination normal.     Vitals:   07/07/22 1308  BP: (!) 162/86  Pulse: (!) 47  SpO2: 96%  Weight: 185 lb (83.9 kg)  Height: '5\' 3"'$  (1.6 m)    Assessment & Plan:

## 2022-07-10 ENCOUNTER — Telehealth: Payer: Self-pay | Admitting: *Deleted

## 2022-07-10 NOTE — Assessment & Plan Note (Signed)
Checking lipid panel and she does not want to take statin.

## 2022-07-10 NOTE — Assessment & Plan Note (Signed)
Checking CBC and CMP. Adjust losartan/hctz 100/12.5 mg daily as needed. BP at goal during visit.

## 2022-07-10 NOTE — Chronic Care Management (AMB) (Signed)
  Care Coordination   Note   07/10/2022 Name: Pamela Morgan MRN: [id number] DOB: 12-03-1945  Pamela Morgan is a 76 y.o. year old female who sees Hoyt Koch, MD for primary care. I reached out to [name] by phone today to offer care coordination services.  [name] was given information about Care Coordination services today including:   The Care Coordination services include support from the care team which includes your Nurse Coordinator, Clinical Social Worker, or Pharmacist.  The Care Coordination team is here to help remove barriers to the health concerns and goals most important to you. Care Coordination services are voluntary, and the patient may decline or stop services at any time by request to their care team member.   Care Coordination Consent Status: Patient agreed to services and verbal consent obtained.   Follow up plan:  Telephone appointment with care coordination team member scheduled for:  07/16/2022  Encounter Outcome:  Pt. Scheduled from referral   Julian Hy, [place] Direct Dial: [phone]

## 2022-07-10 NOTE — Assessment & Plan Note (Signed)
Checking HgA1c. She is taking her 2.5 mg daily glipizide. Adjust as needed.

## 2022-07-10 NOTE — Assessment & Plan Note (Addendum)
With behavioral/mood changes. Is having visual hallucinations. Checking labs today for abnormalities. Rx remeron for night time to help with mood stability and rx lorazapam for prn to use for agitation. Referral to social worker for daughter to connect to resources. 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

## 2022-07-16 ENCOUNTER — Ambulatory Visit: Payer: Self-pay | Admitting: Licensed Clinical Social Worker

## 2022-07-16 NOTE — Patient Outreach (Signed)
  Care Coordination  Initial Visit Note   07/16/2022 Name: JEANNEMARIE SAWAYA MRN: [id number] DOB: January 11, 1946  ASHNI LONZO is a 76 y.o. year old female who sees Hoyt Koch, MD for primary care. I spoke with  [name] 's daughter by phone today.  What matters to the patients health and wellness today?  Caregiver support and resources that will allow daughter to get out of the home to work.   Goals Addressed             This Visit's Progress    Care Coordination Activities       Care Coordination Interventions: Solution-Focused Strategies employed:  Active listening / Reflection utilized  Emotional Support Provided Problem [place] strategies reviewed Provided EMMI education information on :Dementia Caregiver Look for e-mail from [place] stress acknowledged  Discussed caregiver resources and support: [employer] information e-mailed ADL's: Assessed needs, level of care concerns, how currently meeting needs and barriers to care PACE Program (has explored in the past not an options)  Engineer, water Program  (CAP) Referral placed today by [facility]end[facility] program (family has explored this is not an option)  Passenger transport manager booklet: Caring for a Loved Once with Dementia         SDOH assessments and interventions completed:  Yes  SDOH Interventions Today    Flowsheet Row Most Recent Value  SDOH Interventions   Food Insecurity Interventions Intervention Not Indicated  Transportation Interventions Intervention Not Indicated        Care Coordination Interventions Activated:  Yes  Care Coordination Interventions:  Yes, provided   Follow up plan: Follow up call scheduled for 07/30/2022    Encounter Outcome:  Pt. Visit Completed   Casimer Lanius, [place] [phone]

## 2022-07-16 NOTE — Patient Instructions (Signed)
Visit Information  Thank you for taking time to visit with me today. Please don't hesitate to contact me if I can be of assistance to you.   Following are the goals we discussed today:   Goals Addressed             This Visit's Progress    Care Coordination Activities       Care Coordination Interventions: Solution-Focused Strategies employed:  Active listening / Reflection utilized  Emotional Support Provided Problem [place] strategies reviewed Provided EMMI education information on :Dementia Caregiver Look for e-mail from [place] stress acknowledged  Discussed caregiver resources and support: [employer] information e-mailed ADL's: Assessed needs, level of care concerns, how currently meeting needs and barriers to care PACE Program (has explored in the past not an options)  Engineer, water Program  (CAP) Referral placed today by [employer] Aid & Attendance program (family has explored this is not an option)  E-mailed educational information booklet: Caring for a Loved Once with Dementia         Our next appointment is by telephone on 07/30/2022 at 2:00  Please call the care guide team at [phone] if you need to cancel or reschedule your appointment.   If you are experiencing a Mental Health or [place] or need someone to talk to, please call the Suicide and Crisis Lifeline: 988 call the [place] National Suicide Prevention Lifeline: (614) 606-2270 or TTY: 684 792 3522 TTY 931-073-1486) to talk to a trained counselor call 1-800-273-TALK (toll free, 24 hour hotline) go to [facility] [address], [place] [phone])   Patient verbalizes understanding of instructions and care plan provided today and agrees to view in [place]. Active MyChart status and patient understanding of how to access instructions and care plan via MyChart confirmed with patient.     Casimer Lanius,  [place] [phone]

## 2022-07-30 ENCOUNTER — Ambulatory Visit: Payer: Self-pay | Admitting: Licensed Clinical Social Worker

## 2022-07-30 NOTE — Patient Outreach (Signed)
  Care Coordination  Follow Up Visit Note   07/30/2022 Name: Pamela Morgan MRN: [id number] DOB: 1946-03-09  Pamela Morgan is a 76 y.o. year old female who sees Pamela Koch, MD for primary care. I spoke with  Pamela Morgan 's daughter by phone today.  What matters to the patients health and wellness today?  Caregiver support  Patient's daughter Pamela Morgan provided all information during this encounter. She has received CAPS packet and will move forward with completing .   Recommendation: Patient may benefit from, and daughter is in agreement to Get on the wait list for the In-Home Aide program.     Goals Addressed             This Visit's Progress    Care Coordination Activities/ Caregiver support)       Care Coordination Interventions: Solution-Focused Strategies employed:  Active listening / Reflection utilized  Emotional Support Provided Problem [place] strategies reviewed Caregiver stress acknowledged :decided not to move forward with [place]  (CAP) Received in the mail; LCSW reviewed packet and process of completing  DSS in-home aide program:(contact information provided see below)   In Home Aide Service (for individuals without Medicaid) Call [facility] [phone]  Asked to be place on the wait list for in home aide        SDOH assessments and interventions completed:  No   Care Coordination Interventions Activated:  Yes  Care Coordination Interventions:  Yes, provided   Follow up plan: Follow up call scheduled for 30 days    Encounter Outcome:  Pt. Visit Completed   Pamela Morgan, [place] [phone]

## 2022-07-30 NOTE — Patient Instructions (Signed)
Visit Information  Thank you for taking time to visit with me today. Please don't hesitate to contact me if I can be of assistance to you.   Following are the goals we discussed today:   Goals Addressed             This Visit's Progress    Care Coordination Activities/ Caregiver support)       Care Coordination Interventions: Solution-Focused Strategies employed:  Active listening / Reflection utilized  Emotional Support Provided Problem [place] strategies reviewed Caregiver stress acknowledged :decided not to move forward with [place]  (CAP) Received in the mail; LCSW reviewed packet and process of completing  DSS in-home aide program:(contact information provided see below)   In Home Aide Service (for individuals without Medicaid) Call [facility] [phone]  Asked to be place on the wait list for in home aide       Our next appointment is by telephone on 09/05/2022 at 11:00  Please call the care guide team at [phone] if you need to cancel or reschedule your appointment.   Patient verbalizes understanding of instructions and care plan provided today and agrees to view in [place]. Active MyChart status and patient understanding of how to access instructions and care plan via MyChart confirmed with patient.     Casimer Lanius, [place] [phone]

## 2022-08-05 ENCOUNTER — Telehealth: Payer: Self-pay

## 2022-08-05 NOTE — Telephone Encounter (Signed)
MEDICATION: glipiZIDE (GLIPIZIDE XL) 2.5 MG 24 hr tablet // fluticasone (FLONASE) 50 MCG/ACT nasal spray// losartan-hydrochlorothiazide (HYZAAR) 100-12.5 MG tablet // sitaGLIPtin (JANUVIA) 100 MG tablet  PHARMACY: [facility] [place], [place] - [address]   Comments: Daughter would like all meds to go through [facility]  **Let patient know to contact pharmacy at the end of the day to make sure medication is ready. **  ** Please notify patient to allow 48-72 hours to process**  **Encourage patient to contact the pharmacy for refills or they can request refills through [facility]**

## 2022-08-08 ENCOUNTER — Other Ambulatory Visit: Payer: Self-pay | Admitting: *Deleted

## 2022-08-08 MED ORDER — LOSARTAN POTASSIUM-HCTZ 100-12.5 MG PO TABS: 1.0000 | ORAL_TABLET | Freq: Every day | ORAL | 3 refills | Status: DC

## 2022-08-08 MED ORDER — SITAGLIPTIN PHOSPHATE 100 MG PO TABS
100.0000 mg | ORAL_TABLET | Freq: Every day | ORAL | 0 refills | Status: DC
Start: 2022-08-08 — End: 2022-10-31

## 2022-08-08 MED ORDER — GLIPIZIDE ER 2.5 MG PO TB24: ORAL_TABLET | ORAL | 3 refills | Status: DC

## 2022-08-08 MED ORDER — FLUTICASONE PROPIONATE 50 MCG/ACT NA SUSP: 2.0000 | Freq: Every day | NASAL | 3 refills | Status: DC

## 2022-08-08 NOTE — Telephone Encounter (Signed)
Re-sent to [facility]

## 2022-08-14 ENCOUNTER — Encounter: Payer: Self-pay | Admitting: Family Medicine

## 2022-08-14 ENCOUNTER — Ambulatory Visit (INDEPENDENT_AMBULATORY_CARE_PROVIDER_SITE_OTHER): Payer: Medicare Other | Admitting: Family Medicine

## 2022-08-14 VITALS — BP 146/90 | HR 55 | Temp 97.6°F | Ht 63.0 in | Wt 183.0 lb

## 2022-08-14 DIAGNOSIS — R051 Acute cough: Secondary | ICD-10-CM | POA: Diagnosis not present

## 2022-08-14 DIAGNOSIS — J4531 Mild persistent asthma with (acute) exacerbation: Secondary | ICD-10-CM

## 2022-08-14 DIAGNOSIS — J309 Allergic rhinitis, unspecified: Secondary | ICD-10-CM | POA: Diagnosis not present

## 2022-08-14 DIAGNOSIS — R0602 Shortness of breath: Secondary | ICD-10-CM | POA: Diagnosis not present

## 2022-08-14 LAB — POC COVID19 BINAXNOW: SARS Coronavirus 2 Ag: NEGATIVE

## 2022-08-14 MED ORDER — PREDNISONE 10 MG (21) PO TBPK: ORAL_TABLET | Freq: Every day | ORAL | 0 refills | Status: DC

## 2022-08-14 NOTE — Patient Instructions (Addendum)
Your COVID test is negative.  Take the steroid Dosepak as prescribed.  Continue your current medications for allergies and asthma.  Stay well-hydrated.  Follow-up if you are not getting worse or if you are not significantly better by Monday.

## 2022-08-14 NOTE — Progress Notes (Signed)
Subjective:  Pamela Morgan is a 76 y.o. female who presents for a 2 day history of rhinorrhea, nasal congestion, and cough. Some mild shortness of breath and wheezing. Using Breo and albuterol.   Taking Allegra and Flonase.  She has been around a cat which she is allergic to per family member.   Denies fever, chills, body aches, dizziness, chest pain, N/V/D.   Denies sick contacts.  No other aggravating or relieving factors.  No other c/o.  ROS as in subjective.   Objective: Vitals:   08/14/22 1429  BP: (!) 146/90  Pulse: (!) 55  Temp: 97.6 F (36.4 C)  SpO2: 98%    General appearance: Alert, WD/WN, no distress, mildly ill appearing                             Skin: warm, no rash                           Head: no sinus tenderness                            Eyes: conjunctiva normal, corneas clear, PERRLA                            Ears: pearly TMs, external ear canals normal                          Nose: septum midline, turbinates swollen, with erythema and clear discharge             Mouth/throat: MMM, tongue normal, mild pharyngeal erythema                           Neck: supple, no adenopathy, no thyromegaly, nontender                          Heart: RRR                         Lungs: expiratory wheezes, and scattered rhonchi       Assessment: Mild persistent asthma with acute exacerbation - Plan: predniSONE (STERAPRED UNI-PAK 21 TAB) 10 MG (21) TBPK tablet  Allergic rhinitis, unspecified seasonality, unspecified trigger - Plan: POC COVID-19  Shortness of breath - Plan: POC COVID-19  Acute cough - Plan: POC COVID-19   Plan: Negative Covid test.  Oral prednisone tapering dose pak prescribed. Antibiotics not prescribed today. Continue allergy and asthma medications.  Discussed diagnosis and treatment of URI.  Suggested symptomatic OTC remedies. Follow up if worsening or if not back to baseline after completing the oral steroid course.

## 2022-09-04 ENCOUNTER — Ambulatory Visit: Payer: Self-pay | Admitting: Licensed Clinical Social Worker

## 2022-09-04 NOTE — Patient Instructions (Signed)
Visit Information  Thank you for taking time to visit with me today. Please don't hesitate to contact me if I can be of assistance to you.   Following are the goals we discussed today:   Goals Addressed             This Visit's Progress    Care Coordination Activities/ Caregiver support)       Care Coordination Interventions: Solution-Focused Strategies employed:  Active listening / Reflection utilized  Problem [place] strategies reviewed Caregiver stress acknowledged :   [place]  (CAP) : turning in application today DSS in-home aide program:(contact information provided see below)   In Home Aide Service (for individuals without Medicaid) Call [facility] [phone]  Asked to be place on the wait list for in home aide        Our next appointment is by telephone on 10/06/22 at 11:30  Please call the care guide team at [phone] if you need to cancel or reschedule your appointment.   Patient 's daughter verbalizes understanding of instructions and care plan provided today and agrees to view in [place]. Active MyChart status and patient understanding of how to access instructions and care plan via MyChart confirmed with patient.     Casimer Lanius, [place] [phone]

## 2022-09-04 NOTE — Patient Outreach (Signed)
  Care Coordination  Follow Up Visit Note   09/04/2022 Name: Pamela Morgan MRN: [id number] DOB: 05/09/1946  Pamela Morgan is a 76 y.o. year old female who sees Hoyt Koch, MD for primary care. I spoke with  Luvenia Heller daughter by phone today.  What matters to the patients health and wellness today?    Patient's daughter Pamela Morgan provided all information during this encounter. [facility]   Recommendation: Patient may benefit from, and is in agreement to Turn in CAPS application and contact DSS for in-home aide program.     Goals Addressed             This Visit's Progress    Care Coordination Activities/ Caregiver support)       Care Coordination Interventions: Solution-Focused Strategies employed:  Active listening / Reflection utilized  Problem [place] strategies reviewed Caregiver stress acknowledged :   [place]  (CAP) : turning in application today DSS in-home aide program:(contact information provided see below)   In Home Aide Service (for individuals without Medicaid) Call [facility] [phone]  Asked to be place on the wait list for in home aide        SDOH assessments and interventions completed:  No    Care Coordination Interventions Activated:  Yes  Care Coordination Interventions:  Yes, provided   Follow up plan: Follow up call scheduled for 30 days    Encounter Outcome:  Pt. Visit Completed   Casimer Lanius, [place] [phone]

## 2022-09-05 ENCOUNTER — Encounter: Payer: Medicare Other | Admitting: Licensed Clinical Social Worker

## 2022-09-26 ENCOUNTER — Telehealth: Payer: Self-pay | Admitting: Internal Medicine

## 2022-09-26 ENCOUNTER — Other Ambulatory Visit: Payer: Self-pay | Admitting: Internal Medicine

## 2022-09-26 MED ORDER — FLUTICASONE FUROATE-VILANTEROL 200-25 MCG/ACT IN AEPB: 1.0000 | INHALATION_SPRAY | Freq: Every day | RESPIRATORY_TRACT | 0 refills | Status: DC

## 2022-09-26 NOTE — Telephone Encounter (Signed)
Caller & Relationship to patient:  Elta Angell - Daughter - Daughter would like to know when this has been sent   Call back number:  [phone]   Date of last office visit:  08/14/2022   Date of next office visit:Not scheduled   Medication(s) to be refilled:Breo        Preferred Pharmacy: Please send to [facility] on [address] - It will be coming from meds by mail but she needs one for now until it gets here

## 2022-09-26 NOTE — Telephone Encounter (Signed)
Rx sent  [name] informed.

## 2022-09-30 ENCOUNTER — Ambulatory Visit (INDEPENDENT_AMBULATORY_CARE_PROVIDER_SITE_OTHER): Payer: Medicare Other | Admitting: *Deleted

## 2022-09-30 DIAGNOSIS — Z0000 Encounter for general adult medical examination without abnormal findings: Secondary | ICD-10-CM | POA: Diagnosis not present

## 2022-09-30 DIAGNOSIS — Z599 Problem related to housing and economic circumstances, unspecified: Secondary | ICD-10-CM
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
## 2022-09-30 NOTE — Patient Instructions (Signed)

## 2022-10-01 ENCOUNTER — Telehealth: Payer: Self-pay

## 2022-10-01 ENCOUNTER — Telehealth: Payer: Self-pay | Admitting: Internal Medicine

## 2022-10-01 NOTE — Telephone Encounter (Signed)
Telephone encounter was:  Successful.  10/01/2022 Name: Pamela Morgan MRN: [id number] DOB: 1946-05-22  Pamela Morgan is a 76 y.o. year old female who is a primary care patient of Okey Dupre Austin Miles, MD . The community resource team was consulted for assistance with Financial Difficulties related to Financial strain  Care guide performed the following interventions: Patient provided with information about care guide support team and interviewed to confirm resource needs.Patient stated she was in need of financial resources to help with utilities.I have emailed resouces and added a referral to [facility]   Follow Up Plan:  No further follow up planned at this time. The patient has been provided with needed resources.    Lenard Forth [facility] Guide, [facility], Care Management  [phone] [address] [place], [place], [place] [address] Phone: [phone] Email: [email or web address]

## 2022-10-06 ENCOUNTER — Ambulatory Visit: Payer: Self-pay | Admitting: Licensed Clinical Social Worker

## 2022-10-06 NOTE — Patient Outreach (Signed)
  Care Coordination  Follow Up Visit Note   10/06/2022 Name: Pamela Morgan MRN: [id number] DOB: Dec 22, 1945  Pamela Morgan is a 76 y.o. year old female who sees Pamela Koch, MD for primary care. I spoke with  Pamela Morgan daughter by phone today.  What matters to the patients health and wellness today?    Patient's daughter Pamela Morgan provided all information during this encounter. She  is making progress with managing patient's care. Patient is going to Adult Day Care everyday.  CAPS application has been submitted  Recommendation: Patient may benefit from, and daughter is in agreement to :  Follow up on Medicaid application and get on wait list for in-home aide program       Goals Addressed             This Visit's Progress    Care Coordination Activities/ Caregiver support)       Care Coordination Interventions: Solution-Focused Strategies employed:  Active listening / Reflection utilized  Problem [place] strategies reviewed Caregiver stress acknowledged :   Lobbyist  (CAP) : turning in application today DSS in-home aide program:(contact information provided see below)  Has applied for Medicaid encouraged to follow up In [place] (for individuals without Medicaid) [facility] [phone]  Asked to be place on the wait list for in home aide        SDOH assessments and interventions completed:  No    Care Coordination Interventions:  Yes, provided   Follow up plan: Follow up call scheduled for 30 days    Encounter Outcome:  Pt. Visit Completed   Pamela Morgan, [place] [phone]

## 2022-10-06 NOTE — Patient Instructions (Signed)
Visit Information  Thank you for taking time to visit with me today. Please don't hesitate to contact me if I can be of assistance to you.   Following are the goals we discussed today:   Goals Addressed             This Visit's Progress    Care Coordination Activities/ Caregiver support)       Care Coordination Interventions: Solution-Focused Strategies employed:  Active listening / Reflection utilized  Problem [place] strategies reviewed Caregiver stress acknowledged :   Lobbyist  (CAP) : turning in application today DSS in-home aide program:(contact information provided see below)  Has applied for Medicaid encouraged to follow up In [place] (for individuals without Medicaid) Call [facility] [phone]  Asked to be place on the wait list for in home aide        Our next appointment is by telephone on Jan 22nd at 11:30  Please call the care guide team at [phone] if you need to cancel or reschedule your appointment.    Patient verbalizes understanding of instructions and care plan provided today and agrees to view in [place]. Active MyChart status and patient understanding of how to access instructions and care plan via MyChart confirmed with patient.     Casimer Lanius, [place] [phone]

## 2022-10-09 ENCOUNTER — Encounter: Payer: Self-pay | Admitting: Internal Medicine

## 2022-10-09 MED ORDER — PREDNISONE 20 MG PO TABS: 40.0000 mg | ORAL_TABLET | Freq: Every day | ORAL | 0 refills | Status: DC

## 2022-10-30 ENCOUNTER — Other Ambulatory Visit: Payer: Self-pay | Admitting: Internal Medicine

## 2022-11-10 ENCOUNTER — Ambulatory Visit: Payer: Self-pay | Admitting: Licensed Clinical Social Worker

## 2022-11-10 NOTE — Patient Instructions (Signed)
Visit Information  Thank you for taking time to visit with me today. Please don't hesitate to contact me if I can be of assistance to you.   Following are the goals we discussed today:   Goals Addressed             This Visit's Progress    COMPLETED: Care Coordination Activities/ Caregiver support)       Care Coordination Interventions: Solution-Focused Strategies employed:  Active listening / Reflection utilized  Problem [place] strategies reviewed Caregiver stress acknowledged :   [employer] Program  ([employer]) : LCSW Collaborated with Teaching laboratory technician. Patient should receive letter in the mail in the next few weeks  Has applied for Medicaid (does not qualify)  Referral made for ramp:  per daughter they have come out to measure and anticipate construction starting soon. In Home Aide Service (for individuals without Medicaid) Call [facility] [phone]  Asked to be place on the wait list for in home aide        Patient verbalizes understanding of instructions and care plan provided today and agrees to view in [place]. Active MyChart status and patient understanding of how to access instructions and care plan via MyChart confirmed with patient.     No further follow up required: By Care Coordination at this time  Casimer Lanius, [place] [phone]

## 2022-11-10 NOTE — Patient Outreach (Signed)
  Care Coordination  Follow Up Visit Note   11/10/2022 Name: Pamela Morgan MRN: [id number] DOB: Mar 31, 1946  Pamela Morgan is a 77 y.o. year old female who sees Hoyt Koch, MD for primary care. I spoke with  Luvenia Heller daughter by phone today.  What matters to the patients health and wellness today?    Patient's daughter [name] provided all information during this encounter. She is making progress with meeting patient's goals and has now returned to work.  Also receiving additional support from her brother to care for patient.  Patient continues to Adult Day Care daily .     Goals Addressed             This Visit's Progress    COMPLETED: Care Coordination Activities/ Caregiver support)       Care Coordination Interventions: Solution-Focused Strategies employed:  Active listening / Reflection utilized  Problem [place] strategies reviewed Caregiver stress acknowledged :   [employer] Program  (CAP) : LCSW Collaborated with Teaching laboratory technician. Patient should receive letter in the mail in the next few weeks  Has applied for Medicaid (does not qualify)  Referral made for ramp:  per daughter they have come out to measure and anticipate construction starting soon. In [place] (for individuals without Medicaid) Call [facility] [phone]  Asked to be place on the wait list for in home aide        SDOH assessments and interventions completed:  No    Care Coordination Interventions:  Yes, provided   Follow up plan: No further intervention required by LCSW at this time.  Daughter will contact if additional information is needed.  Encounter Outcome:  Pt. Visit Completed

## 2022-11-25 ENCOUNTER — Telehealth: Payer: Self-pay | Admitting: Internal Medicine

## 2022-11-25 NOTE — Telephone Encounter (Signed)
Patient's daughter is requesting a letter to be written for her mother who has a court date on Monday Feb. 12th and she needs it to state that her mother is not able to attend the court date because she has Dementia. Patient's daughter stated that her mother will not be able to handle it. Best callback number for daughter [phone].

## 2022-11-26 NOTE — Telephone Encounter (Signed)
We likely need more information about this situation. If the court date is related to patient then she would have legal right to be there

## 2022-11-27 NOTE — Telephone Encounter (Signed)
Called patients daughter and LVM

## 2022-11-27 NOTE — Telephone Encounter (Signed)
Patient's daughter called back and said that she needs to pick up the letter by tomorrow.

## 2022-11-27 NOTE — Telephone Encounter (Signed)
I am able to do note that patient has dementia but they will likely need to talk to a lawyer as there are several levels of legal responsibility to appear before court to see if it is required for her to be there or just a request.

## 2022-11-28 NOTE — Telephone Encounter (Signed)
Done to [employer]. If they need printed out okay to print and put up front.

## 2022-11-28 NOTE — Telephone Encounter (Signed)
Notified daughter inform of MD response. She states since she is at work she will print letter off herself.Marland KitchenAndee Poles

## 2022-11-28 NOTE — Telephone Encounter (Signed)
PT's daughter calls back today and notes she would be completely fine with what was offered (note stating PT's diagnosis of dementia). PT states this was all new to her and she wasn't sure what would end up being required but that the written diagnosis might be a good start for them. If possible she would like to be able to pick up the physical form and would like a digital copy sent through [place].  CB if needed: [phone]

## 2022-12-10 ENCOUNTER — Other Ambulatory Visit: Payer: Self-pay | Admitting: Internal Medicine

## 2022-12-15 ENCOUNTER — Telehealth: Payer: Self-pay | Admitting: Internal Medicine

## 2022-12-15 NOTE — Telephone Encounter (Signed)
MEDICATION:losartan-hydrochlorothiazide (HYZAAR) 100-12.5 MG tablet   PHARMACY:[facility] - [place], [place] - [address]   Comments: prescription expired  **Let patient know to contact pharmacy at the end of the day to make sure medication is ready. **  ** Please notify patient to allow 48-72 hours to process**  **Encourage patient to contact the pharmacy for refills or they can request refills through [facility]**

## 2022-12-16 MED ORDER — LOSARTAN POTASSIUM-HCTZ 100-12.5 MG PO TABS: 1.0000 | ORAL_TABLET | Freq: Every day | ORAL | 2 refills | Status: DC

## 2022-12-16 NOTE — Telephone Encounter (Signed)
Rx has been sent to [facility].Marland KitchenJohny Chess

## 2022-12-30 ENCOUNTER — Other Ambulatory Visit: Payer: Self-pay

## 2022-12-30 MED ORDER — LOSARTAN POTASSIUM-HCTZ 100-12.5 MG PO TABS: 1.0000 | ORAL_TABLET | Freq: Every day | ORAL | 2 refills | Status: DC

## 2022-12-30 NOTE — Telephone Encounter (Addendum)
Patient has not received medication. She would still like it sent to [facility], but she is now out of the medication. She would like one prescription to be sent to the [facility] on [address] so they can get it sooner.  Best callback number is [phone].

## 2022-12-30 NOTE — Telephone Encounter (Signed)
Send in refill for the patient to [facility]

## 2022-12-31 ENCOUNTER — Telehealth: Payer: Self-pay | Admitting: Internal Medicine

## 2022-12-31 NOTE — Telephone Encounter (Signed)
Ander Gaster with [facility] called needing orders put in for lidocaine patches as the patient has chronic back pain. A good callback number is [phone] and fax [phone].

## 2023-01-01 NOTE — Telephone Encounter (Signed)
Ok for verbals 

## 2023-01-02 MED ORDER — LIDOCAINE 5 % EX PTCH: MEDICATED_PATCH | CUTANEOUS | 3 refills | Status: DC

## 2023-01-02 NOTE — Telephone Encounter (Signed)
Called Angel back gave MD response. She states due to the state they need order faxed over. Printed script faxed to [phone]

## 2023-03-25 ENCOUNTER — Ambulatory Visit (INDEPENDENT_AMBULATORY_CARE_PROVIDER_SITE_OTHER): Payer: Medicare Other | Admitting: Internal Medicine

## 2023-03-25 ENCOUNTER — Encounter: Payer: Self-pay | Admitting: Internal Medicine

## 2023-03-25 VITALS — BP 160/80 | HR 55 | Temp 97.8°F | Ht 63.0 in | Wt 188.0 lb

## 2023-03-25 DIAGNOSIS — E118 Type 2 diabetes mellitus with unspecified complications: Secondary | ICD-10-CM

## 2023-03-25 DIAGNOSIS — I10 Essential (primary) hypertension: Secondary | ICD-10-CM | POA: Diagnosis not present

## 2023-03-25 DIAGNOSIS — E785 Hyperlipidemia, unspecified: Secondary | ICD-10-CM

## 2023-03-25 DIAGNOSIS — F02B18 Dementia in other diseases classified elsewhere, moderate, with other behavioral disturbance: Secondary | ICD-10-CM

## 2023-03-25 DIAGNOSIS — J4530 Mild persistent asthma, uncomplicated: Secondary | ICD-10-CM

## 2023-03-25 DIAGNOSIS — E1169 Type 2 diabetes mellitus with other specified complication: Secondary | ICD-10-CM

## 2023-03-25 DIAGNOSIS — G300 Alzheimer's disease with early onset: Secondary | ICD-10-CM | POA: Diagnosis not present

## 2023-03-25 LAB — COMPREHENSIVE METABOLIC PANEL
ALT: 20 U/L (ref 0–35)
AST: 21 U/L (ref 0–37)
Albumin: 3.9 g/dL (ref 3.5–5.2)
Alkaline Phosphatase: 86 U/L (ref 39–117)
BUN: 9 mg/dL (ref 6–23)
CO2: 29 mEq/L (ref 19–32)
Calcium: 9.2 mg/dL (ref 8.4–10.5)
Chloride: 98 mEq/L (ref 96–112)
Creatinine, Ser: 0.93 mg/dL (ref 0.40–1.20)
GFR: 59.77 mL/min — ABNORMAL LOW (ref >60.00)
Glucose, Bld: 316 mg/dL — ABNORMAL HIGH (ref 70–99)
Potassium: 3.7 mEq/L (ref 3.5–5.1)
Sodium: 140 mEq/L (ref 135–145)
Total Bilirubin: 0.6 mg/dL (ref 0.2–1.2)
Total Protein: 6.9 g/dL (ref 6.0–8.3)

## 2023-03-25 LAB — CBC
HCT: 41.4 % (ref 36.0–46.0)
Hemoglobin: 13.3 g/dL (ref 12.0–15.0)
MCHC: 32.1 g/dL (ref 30.0–36.0)
MCV: 85.7 fl (ref 78.0–100.0)
Platelets: 200 10*3/uL (ref 150.0–400.0)
RBC: 4.83 Mil/uL (ref 3.87–5.11)
RDW: 13.7 % (ref 11.5–15.5)
WBC: 8.1 10*3/uL (ref 4.0–10.5)

## 2023-03-25 LAB — LIPID PANEL
Cholesterol: 180 mg/dL (ref 0–200)
HDL: 55.5 mg/dL (ref >39.00)
LDL Cholesterol: 106 mg/dL — ABNORMAL HIGH (ref 0–99)
NonHDL: 124.42
Total CHOL/HDL Ratio: 3
Triglycerides: 93 mg/dL (ref 0.0–149.0)
VLDL: 18.6 mg/dL (ref 0.0–40.0)

## 2023-03-25 LAB — HEMOGLOBIN A1C: Hgb A1c MFr Bld: 8.5 % — ABNORMAL HIGH (ref 4.6–6.5)

## 2023-03-25 NOTE — Progress Notes (Signed)
   Subjective:   [name], female    DOB: 1946/01/13, 77 y.o.   MRN: [id number]  HPI The patient is a 77 YO female coming in for slight worsening cough and follow up. In adult day care and overall stable.   Review of Systems  Unable to perform ROS: Dementia  Constitutional: Negative.   HENT: Negative.    Eyes: Negative.   Respiratory:  Positive for cough. Negative for chest tightness and shortness of breath.   Cardiovascular:  Negative for chest pain, palpitations and leg swelling.  Gastrointestinal:  Negative for abdominal distention, abdominal pain, constipation, diarrhea, nausea and vomiting.  Musculoskeletal: Negative.   Skin: Negative.   Neurological: Negative.   Psychiatric/Behavioral: Negative.      Objective:  Physical Exam Constitutional:      Appearance: She is well-developed.  HENT:     Head: Normocephalic and atraumatic.     Comments: Oropharynx with redness and clear drainage, nose with swollen turbinates, TMs normal bilaterally.  Neck:     Thyroid: No thyromegaly.  Cardiovascular:     Rate and Rhythm: Normal rate and regular rhythm.  Pulmonary:     Effort: Pulmonary effort is normal. No respiratory distress.     Breath sounds: Normal breath sounds. No wheezing or rales.  Abdominal:     General: Bowel sounds are normal. There is no distension.     Palpations: Abdomen is soft.     Tenderness: There is no abdominal tenderness. There is no rebound.  Musculoskeletal:        General: No tenderness.     Cervical back: Normal range of motion.  Lymphadenopathy:     Cervical: No cervical adenopathy.  Skin:    General: Skin is warm and dry.  Neurological:     Mental Status: She is alert and oriented to person, place, and time.     Coordination: Coordination normal.     Vitals:   03/25/23 0848 03/25/23 0851  BP: (!) 160/80 (!) 160/80  Pulse: (!) 55   Temp: 97.8 F (36.6 C)   TempSrc: Oral   SpO2: 95%   Weight: 188 lb (85.3 kg)   Height:  5\' 3"  (1.6 m)     Assessment & Plan:

## 2023-03-25 NOTE — Patient Instructions (Addendum)
Try doubling the allegra to help the cough.  We will check the labs today. Watch the blood pressure at home and let us know if high at home.

## 2023-03-26 LAB — MICROALBUMIN / CREATININE URINE RATIO
Creatinine,U: 72.5 mg/dL
Microalb Creat Ratio: 1 mg/g (ref 0.0–30.0)
Microalb, Ur: <0.7 mg/dL (ref 0.0–1.9)

## 2023-03-26 NOTE — Assessment & Plan Note (Addendum)
Checking HgA1c and microalbumin to creatinine ratio and lipid panel and CMP and foot exam done. Reminded about eye exam. Previously mild uncontrolled. She is taking januvia 100 mg daily and glipizide 2.5 mg daily and is on ARB. Adjust as needed.

## 2023-03-26 NOTE — Assessment & Plan Note (Signed)
BP elevated today on losartan/hctz 100/12.5 mg daily and there is room for increase. They wish to check BP at home since she does not adjust well to leaving home currently due to dementia. They will let us know and adjust as needed.

## 2023-03-26 NOTE — Assessment & Plan Note (Signed)
Using breo and nebulizers prn. Some increased cough likely due to allergies. No flare today and no wheezing on exam.

## 2023-03-26 NOTE — Assessment & Plan Note (Signed)
With some behavioral changes and wandering. She is using lorazepam 0.5 mg BID prn to help and remeron 15 mg qhs. Overall stable to mild progression. She is at adult day care and there are some aggressive behaviors there.

## 2023-03-26 NOTE — Assessment & Plan Note (Signed)
Does not take statins due to side effects. Rechecking lipid panel today.

## 2023-03-27 ENCOUNTER — Telehealth: Payer: Self-pay | Admitting: Internal Medicine

## 2023-03-27 NOTE — Telephone Encounter (Signed)
Pamela Morgan from [facility] wanting to know if pt had any changes on her med list. She would like a call back from the nurse.  Best call back number [phone]

## 2023-03-31 NOTE — Telephone Encounter (Signed)
Called Mrs. Lovell Sheehan back per pt chart no changes on pt meds. She saw MD on 03/24/23.Marland KitchenRaechel Chute

## 2023-04-01 ENCOUNTER — Telehealth: Payer: Self-pay | Admitting: Internal Medicine

## 2023-04-01 ENCOUNTER — Other Ambulatory Visit: Payer: Self-pay

## 2023-04-01 ENCOUNTER — Other Ambulatory Visit: Payer: Self-pay | Admitting: Internal Medicine

## 2023-04-01 MED ORDER — LOSARTAN POTASSIUM-HCTZ 100-12.5 MG PO TABS: 1.0000 | ORAL_TABLET | Freq: Every day | ORAL | 2 refills | Status: DC

## 2023-04-01 NOTE — Telephone Encounter (Signed)
Prescription Request  04/01/2023  LOV: 03/25/2023  What is the name of the medication or equipment? osartan-hydrochlorothiazide (HYZAAR) 100-12.5 MG tablet [  Have you contacted your pharmacy to request a refill? No   Which pharmacy would you like this sent to?    [facility] - [place], [place] - [address] [facility] [address] [place] [address] Phone: [phone] Fax: [phone]  Patient notified that their request is being sent to the clinical staff for review and that they should receive a response within 2 business days.   Please advise at Mobile [phone] (mobile)

## 2023-04-01 NOTE — Telephone Encounter (Signed)
Sent in

## 2023-04-03 ENCOUNTER — Other Ambulatory Visit: Payer: Self-pay | Admitting: Internal Medicine

## 2023-04-03 MED ORDER — GLIPIZIDE ER 5 MG PO TB24: 5.0000 mg | ORAL_TABLET | Freq: Every day | ORAL | 1 refills | Status: DC

## 2023-05-14 ENCOUNTER — Telehealth: Payer: Self-pay | Admitting: Internal Medicine

## 2023-05-14 MED ORDER — LOSARTAN POTASSIUM-HCTZ 100-12.5 MG PO TABS: 1.0000 | ORAL_TABLET | Freq: Every day | ORAL | 1 refills | Status: DC

## 2023-05-14 MED ORDER — FLUTICASONE FUROATE-VILANTEROL 200-25 MCG/ACT IN AEPB: 1.0000 | INHALATION_SPRAY | Freq: Every day | RESPIRATORY_TRACT | 1 refills | Status: DC

## 2023-05-14 NOTE — Telephone Encounter (Signed)
Sent refill to [facility].Marland KitchenShearon Stalls

## 2023-05-14 NOTE — Telephone Encounter (Signed)
Prescription Request  05/14/2023  LOV: 03/25/2023  What is the name of the medication or equipment? Breo elipta and losartan  Have you contacted your pharmacy to request a refill? Yes   Which pharmacy would you like this sent to?   [facility] - [place], [place] - [address] [facility] [address] [place] [address] Phone: [phone] Fax: [phone]    Patient notified that their request is being sent to the clinical staff for review and that they should receive a response within 2 business days.   Please advise at Mobile [phone] (mobile)

## 2023-06-08 ENCOUNTER — Telehealth: Payer: Self-pay | Admitting: Internal Medicine

## 2023-06-08 ENCOUNTER — Ambulatory Visit
Admission: RE | Admit: 2023-06-08 | Discharge: 2023-06-08 | Disposition: A | Discharge status: Home / Self Care | Payer: Medicare Other | Source: Ambulatory Visit | Attending: Internal Medicine | Admitting: Internal Medicine

## 2023-06-08 ENCOUNTER — Encounter: Payer: Self-pay | Admitting: Internal Medicine

## 2023-06-08 ENCOUNTER — Ambulatory Visit (INDEPENDENT_AMBULATORY_CARE_PROVIDER_SITE_OTHER): Payer: Medicare Other | Admitting: Internal Medicine

## 2023-06-08 VITALS — BP 132/70 | HR 57 | Temp 98.5°F | Ht 63.0 in | Wt 189.0 lb

## 2023-06-08 DIAGNOSIS — R1030 Lower abdominal pain, unspecified: Secondary | ICD-10-CM

## 2023-06-08 DIAGNOSIS — K5909 Other constipation: Secondary | ICD-10-CM | POA: Diagnosis not present

## 2023-06-08 DIAGNOSIS — R350 Frequency of micturition: Secondary | ICD-10-CM

## 2023-06-08 DIAGNOSIS — R1031 Right lower quadrant pain: Secondary | ICD-10-CM | POA: Diagnosis not present

## 2023-06-08 DIAGNOSIS — K5900 Constipation, unspecified: Secondary | ICD-10-CM | POA: Diagnosis not present

## 2023-06-08 LAB — CBC WITH DIFFERENTIAL/PLATELET
Basophils Absolute: 0.1 10*3/uL (ref 0.0–0.1)
Basophils Relative: 0.8 % (ref 0.0–3.0)
Eosinophils Absolute: 0.6 10*3/uL (ref 0.0–0.7)
Eosinophils Relative: 6 % — ABNORMAL HIGH (ref 0.0–5.0)
HCT: 42.5 % (ref 36.0–46.0)
Hemoglobin: 13.5 g/dL (ref 12.0–15.0)
Lymphocytes Relative: 18.4 % (ref 12.0–46.0)
Lymphs Abs: 1.8 10*3/uL (ref 0.7–4.0)
MCHC: 31.6 g/dL (ref 30.0–36.0)
MCV: 86.4 fl (ref 78.0–100.0)
Monocytes Absolute: 0.6 10*3/uL (ref 0.1–1.0)
Monocytes Relative: 5.9 % (ref 3.0–12.0)
Neutro Abs: 6.9 10*3/uL (ref 1.4–7.7)
Neutrophils Relative %: 68.9 % (ref 43.0–77.0)
Platelets: 204 10*3/uL (ref 150.0–400.0)
RBC: 4.92 Mil/uL (ref 3.87–5.11)
RDW: 13.9 % (ref 11.5–15.5)
WBC: 10 10*3/uL (ref 4.0–10.5)

## 2023-06-08 LAB — URINALYSIS, ROUTINE W REFLEX MICROSCOPIC
Bilirubin Urine: NEGATIVE
Hgb urine dipstick: NEGATIVE
Ketones, ur: NEGATIVE
Nitrite: NEGATIVE
Specific Gravity, Urine: <=1.005 — AB (ref 1.000–1.030)
Total Protein, Urine: NEGATIVE
Urine Glucose: >=1000 — AB
Urobilinogen, UA: 0.2 (ref 0.0–1.0)
pH: 6 (ref 5.0–8.0)

## 2023-06-08 LAB — COMPREHENSIVE METABOLIC PANEL
ALT: 22 U/L (ref 0–35)
AST: 23 U/L (ref 0–37)
Albumin: 4.3 g/dL (ref 3.5–5.2)
Alkaline Phosphatase: 84 U/L (ref 39–117)
BUN: 10 mg/dL (ref 6–23)
CO2: 30 mEq/L (ref 19–32)
Calcium: 9.2 mg/dL (ref 8.4–10.5)
Chloride: 99 mEq/L (ref 96–112)
Creatinine, Ser: 0.99 mg/dL (ref 0.40–1.20)
GFR: 55.37 mL/min — ABNORMAL LOW (ref >60.00)
Glucose, Bld: 297 mg/dL — ABNORMAL HIGH (ref 70–99)
Potassium: 3.2 mEq/L — ABNORMAL LOW (ref 3.5–5.1)
Sodium: 140 mEq/L (ref 135–145)
Total Bilirubin: 0.6 mg/dL (ref 0.2–1.2)
Total Protein: 7.2 g/dL (ref 6.0–8.3)

## 2023-06-08 NOTE — Patient Instructions (Addendum)
      Blood work and urine tests ordered.   The lab is on the first floor.    A Ct scan was ordered.     We will call you with the results and treatment plan.

## 2023-06-08 NOTE — Progress Notes (Signed)
Subjective:    [name], female    DOB: 06-27-1946, 77 y.o.   MRN: [id number]      HPI Marlena is here for  Chief Complaint  Patient presents with   Hemorrhoids   She is here with her son, her daughter is on the phone.  They help provide history due to Lianni's dementia.   She is having hemorrhoid problem x 3 days. H/o hemorrhoid issues and surgery for them in the past.  She strains to go to the bathroom - has the sensation to have a BM and only a minimal amount comes out.  It is mostly water.    Takes stool softener daily.  Drinks lots of water.  The past few days has been drinking less.    Has rectal itching.  Daughter did see some extra skin in the rectal area.  No rectal pain.    Not sleeping bc thinks she has to have a BM.   Last good BM 4 days ago.  Going about every day prior to this.   Abdominal pain.  This started about one week ago.    No brbpr.   When wipes there is a little stool.     Back pain is chronic but worse.  Take advil or aleve and uses lidocaine patches.    Medications and allergies reviewed with patient and updated if appropriate.  Current Outpatient Medications on File Prior to Visit  Medication Sig Dispense Refill   acyclovir ointment (ZOVIRAX) 5 % Apply 1 application topically every 3 (three) hours. 30 g 0   albuterol (PROVENTIL) (2.5 MG/3ML) 0.083% nebulizer solution Take 3 mLs (2.5 mg total) by nebulization every 6 (six) hours as needed for wheezing or shortness of breath. 150 mL 1   albuterol (VENTOLIN HFA) 108 (90 Base) MCG/ACT inhaler Inhale 1-2 puffs into the lungs every 6 (six) hours as needed for wheezing or shortness of breath. 3 each 1   aspirin 81 MG chewable tablet Chew 81 mg by mouth daily.     Blood Glucose Monitoring Suppl (ONE TOUCH ULTRA MINI) w/Device KIT Use to check sugar 1 each 0   docusate sodium (COLACE) 100 MG capsule Take 1 capsule (100 mg total) by mouth daily. 90 capsule 0   fexofenadine (ALLEGRA) 180  MG tablet Take 1 tablet (180 mg total) by mouth daily. 90 tablet 1   fluticasone (FLONASE) 50 MCG/ACT nasal spray Place 2 sprays into both nostrils daily. 48 g 3   fluticasone furoate-vilanterol (BREO ELLIPTA) 200-25 MCG/ACT AEPB Inhale 1 puff into the lungs daily. 180 each 1   glipiZIDE (GLIPIZIDE XL) 5 MG 24 hr tablet Take 1 tablet (5 mg total) by mouth daily with breakfast. 90 tablet 1   glucose blood (ONE TOUCH ULTRA TEST) test strip 1 each by Other route 2 (two) times daily. Use to check blood sugars twice a day Dx E11.9 100 each 3   JANUVIA 100 MG tablet TAKE ONE TABLET BY MOUTH EVERY DAY 90 tablet 1   Lancets (ONETOUCH ULTRASOFT) lancets Use as instructed 100 each 2   lidocaine (LIDODERM) 5 % APPLY 1 PATCH TO SKIN EVERY DAY (12 HOURS ON - 12 HOURS OFF) 30 patch 3   LORazepam (ATIVAN) 0.5 MG tablet Take 1 tablet (0.5 mg total) by mouth 2 (two) times daily as needed for anxiety. 30 tablet 1   losartan-hydrochlorothiazide (HYZAAR) 100-12.5 MG tablet Take 1 tablet by mouth daily. 90 tablet 1   mirtazapine (  REMERON) 15 MG tablet Take 1 tablet (15 mg total) by mouth at bedtime. 30 tablet 3   olopatadine (PATANOL) 0.1 % ophthalmic solution Place 1 drop into both eyes 2 (two) times daily as needed for allergies. 15 mL 0   No current facility-administered medications on file prior to visit.    Review of Systems  Constitutional:  Negative for appetite change, chills and fever.  Respiratory:  Negative for shortness of breath.   Cardiovascular:  Negative for chest pain, palpitations and leg swelling.  Gastrointestinal:  Positive for abdominal pain and constipation. Negative for blood in stool.  Genitourinary:  Positive for frequency. Negative for dysuria.  Musculoskeletal:  Positive for back pain.  Neurological:  Positive for headaches (occ). Negative for light-headedness.       Objective:   Vitals:   06/08/23 1001 06/08/23 1058  BP: (!) 148/80 132/70  Pulse: (!) 57   Temp: 98.5 F (36.9  C)   SpO2: 94%    BP Readings from Last 3 Encounters:  06/08/23 132/70  03/25/23 (!) 160/80  08/14/22 (!) 146/90   Wt Readings from Last 3 Encounters:  06/08/23 189 lb (85.7 kg)  03/25/23 188 lb (85.3 kg)  08/14/22 183 lb (83 kg)   Body mass index is 33.48 kg/m.    Physical Exam Constitutional:      General: She is not in acute distress.    Appearance: Normal appearance.  HENT:     Head: Normocephalic and atraumatic.  Eyes:     Conjunctiva/sclera: Conjunctivae normal.  Cardiovascular:     Rate and Rhythm: Normal rate and regular rhythm.     Heart sounds: Normal heart sounds.  Pulmonary:     Effort: Pulmonary effort is normal. No respiratory distress.     Breath sounds: Normal breath sounds. No wheezing.  Abdominal:     General: There is no distension.     Palpations: Abdomen is soft.     Tenderness: There is abdominal tenderness (RLQ, suprapubic). There is no guarding or rebound.  Musculoskeletal:     Cervical back: Neck supple.     Right lower leg: No edema.     Left lower leg: No edema.  Lymphadenopathy:     Cervical: No cervical adenopathy.  Skin:    General: Skin is warm and dry.     Findings: No rash.  Neurological:     Mental Status: She is alert. Mental status is at baseline.  Psychiatric:        Mood and Affect: Mood normal.        Behavior: Behavior normal.        Lab Results  Component Value Date   WBC 10.0 06/08/2023   HGB 13.5 06/08/2023   HCT 42.5 06/08/2023   PLT 204.0 06/08/2023   GLUCOSE 297 (H) 06/08/2023   CHOL 180 03/25/2023   TRIG 93.0 03/25/2023   HDL 55.50 03/25/2023   LDLDIRECT 138.9 02/22/2013   LDLCALC 106 (H) 03/25/2023   ALT 22 06/08/2023   AST 23 06/08/2023   NA 140 06/08/2023   K 3.2 (L) 06/08/2023   CL 99 06/08/2023   CREATININE 0.99 06/08/2023   BUN 10 06/08/2023   CO2 30 06/08/2023   TSH 1.26 10/29/2017   INR 1.05 08/07/2015   HGBA1C 8.5 (H) 03/25/2023   MICROALBUR <0.7 03/25/2023     CT ABDOMEN PELVIS WO  CONTRAST CLINICAL DATA:  Right lower quadrant suprapubic pain for 1 week. Constipation.  EXAM: CT ABDOMEN AND PELVIS WITHOUT CONTRAST  TECHNIQUE: Multidetector CT imaging of the abdomen and pelvis was performed following the standard protocol without IV contrast.  RADIATION DOSE REDUCTION: This exam was performed according to the departmental dose-optimization program which includes automated exposure control, adjustment of the mA and/or kV according to patient size and/or use of iterative reconstruction technique.  COMPARISON:  None Available.  FINDINGS: Lower chest: No acute findings. Heart is enlarged. Atherosclerotic calcification of the aortic valve. No pericardial or pleural effusion. Distal esophagus is grossly unremarkable.  Hepatobiliary: Probable left hepatic lobe cyst. Liver is mildly heterogeneous in attenuation. Liver and gallbladder are otherwise grossly unremarkable. No biliary ductal dilatation.  Pancreas: Negative.  Spleen: Negative.  Adrenals/Urinary Tract: Adrenal glands and kidneys are unremarkable. No urinary stones. Ureters are decompressed. Bladder is grossly unremarkable.  Stomach/Bowel: Tiny hiatal hernia. Stomach, small bowel, appendix and colon are otherwise unremarkable. Rectal wall appears thickened with possible mild fecal impaction. Fair amount of stool is seen throughout the colon.  Vascular/Lymphatic: Atherosclerotic calcification of the aorta. No pathologically enlarged lymph nodes.  Reproductive: Hysterectomy.  No adnexal mass.  Other: No free fluid.  Mesenteries and peritoneum are unremarkable.  Musculoskeletal: Degenerative changes in the spine. No worrisome lytic or sclerotic lesions. A lipoma is seen overlying the lateral right abdominal wall musculature.  IMPRESSION: 1. No acute findings. 2. Stool throughout the colon is indicative constipation. Probable mild associated fecal impaction and rectal wall thickening. 3. Liver  may be mildly steatotic. 4.  Aortic atherosclerosis (ICD10-I70.0).  Electronically Signed   By: Leanna Battles M.D.   On: 06/08/2023 15:35      Assessment & Plan:    History is limited and mostly from daughter since patient has dementia  RLQ, suprapubic pain:  Acute started one week ago - prior to constipation starting Constipation x 3-4 days Very tender on exam  Cbc, cmp, ua, urine cx Ct abd/pelvis stat ? Blockage, diverticulitis uti, enteritis.  Less likely pain from back  Urinary frequency: With lower abdominal pain  - ua, ucx to r/o uti   Constipation: Acute on chronic Last BM 4 days ago - acute constipation  Has lower abdominal pain that is very tender on exam - this started prior to constipation Need Ct to r/o colitis, blockage, uti  Hypertension: Chronic Blood pressure well-controlled today after rechecking it-likely initial BP was related to just coming back in some discomfort No change needed to medication Continue losartan-hydrochlorothiazide 100-12.5 mg daily   Called daughter and discussed results.  She does appear to be constipated and no acute infection Advised after discussing with her daughter magnesium citrate, which she has taken before in the past but a while ago.  We discussed MiraLAX, but that does not work quickly for her.  She will try a small amount and see if that helps Advised to use caution because of possible impaction, but since she has been getting some stool out hopefully this will relieve the constipation and discomfort  Will follow-up urine culture    I spent 30 minutes dedicated to the care of this patient on the date of this encounter including review of recent labs, imaging,  obtaining history from patient and daughter and son, communicating with the patient, daughter and son, ordering tests, and documenting clinical information in the EHR

## 2023-06-08 NOTE — Telephone Encounter (Signed)
Patient's daughter called and said patient had an appointment today with Dr. Lawerance Bach and was sent to get a CT. They were informed they need a prior authorization for [employer]. They would like to get that started ASAP. Best callback is [phone].

## 2023-06-09 LAB — URINE CULTURE

## 2023-06-10 ENCOUNTER — Encounter (HOSPITAL_COMMUNITY): Payer: Self-pay

## 2023-06-10 ENCOUNTER — Ambulatory Visit (HOSPITAL_COMMUNITY): Payer: Medicare Other

## 2023-06-10 ENCOUNTER — Telehealth: Payer: Self-pay | Admitting: Internal Medicine

## 2023-06-10 ENCOUNTER — Emergency Department (HOSPITAL_COMMUNITY)
Admission: EM | Admit: 2023-06-10 | Discharge: 2023-06-10 | Disposition: A | Discharge status: Home / Self Care | Payer: Medicare Other | Attending: Emergency Medicine | Admitting: Emergency Medicine

## 2023-06-10 ENCOUNTER — Other Ambulatory Visit: Payer: Self-pay

## 2023-06-10 DIAGNOSIS — K5641 Fecal impaction: Secondary | ICD-10-CM | POA: Insufficient documentation

## 2023-06-10 DIAGNOSIS — Z91010 Allergy to peanuts: Secondary | ICD-10-CM | POA: Diagnosis not present

## 2023-06-10 DIAGNOSIS — Z91040 Latex allergy status: Secondary | ICD-10-CM | POA: Diagnosis not present

## 2023-06-10 DIAGNOSIS — R194 Change in bowel habit: Secondary | ICD-10-CM | POA: Diagnosis present

## 2023-06-10 LAB — BASIC METABOLIC PANEL
Anion gap: 12 (ref 5–15)
BUN: 8 mg/dL (ref 8–23)
CO2: 26 mmol/L (ref 22–32)
Calcium: 8.8 mg/dL — ABNORMAL LOW (ref 8.9–10.3)
Chloride: 97 mmol/L — ABNORMAL LOW (ref 98–111)
Creatinine, Ser: 0.92 mg/dL (ref 0.44–1.00)
GFR, Estimated: >60 mL/min (ref >60)
Glucose, Bld: 344 mg/dL — ABNORMAL HIGH (ref 70–99)
Potassium: 3.5 mmol/L (ref 3.5–5.1)
Sodium: 135 mmol/L (ref 135–145)

## 2023-06-10 LAB — CBC WITH DIFFERENTIAL/PLATELET
Abs Immature Granulocytes: 0.03 10*3/uL (ref 0.00–0.07)
Basophils Absolute: 0.1 10*3/uL (ref 0.0–0.1)
Basophils Relative: 1 %
Eosinophils Absolute: 0.5 10*3/uL (ref 0.0–0.5)
Eosinophils Relative: 5 %
HCT: 39.6 % (ref 36.0–46.0)
Hemoglobin: 12.6 g/dL (ref 12.0–15.0)
Immature Granulocytes: 0 %
Lymphocytes Relative: 16 %
Lymphs Abs: 1.4 10*3/uL (ref 0.7–4.0)
MCH: 27.1 pg (ref 26.0–34.0)
MCHC: 31.8 g/dL (ref 30.0–36.0)
MCV: 85.2 fL (ref 80.0–100.0)
Monocytes Absolute: 0.6 10*3/uL (ref 0.1–1.0)
Monocytes Relative: 6 %
Neutro Abs: 6.5 10*3/uL (ref 1.7–7.7)
Neutrophils Relative %: 72 %
Platelets: 204 10*3/uL (ref 150–400)
RBC: 4.65 MIL/uL (ref 3.87–5.11)
RDW: 12.8 % (ref 11.5–15.5)
WBC: 9 10*3/uL (ref 4.0–10.5)
nRBC: 0 % (ref 0.0–0.2)

## 2023-06-10 NOTE — Discharge Instructions (Signed)
Take 8 scoops of miralax in 32oz of whatever you would like to drink.(Gatorade comes in this size) You can also use a fleets enema which you can buy over the counter at the pharmacy.  Return for worsening abdominal pain, vomiting or fever. ? ?

## 2023-06-10 NOTE — Telephone Encounter (Signed)
Patient currently in ED now.

## 2023-06-10 NOTE — Telephone Encounter (Signed)
If she is not having bowel movements she may need to be evaluated in the emergency room and be disimpacted manually.

## 2023-06-10 NOTE — ED Provider Notes (Signed)
[place] AT [facility] Provider Note   CSN: [id number] Arrival date & time: 06/10/23  2440     History  Chief Complaint  Patient presents with   Constipation    Pamela PANO is a 77 y.o. female.  77 yo F with a chief complaint of difficulty moving her bowels.  Has been going on for about 4 to 5 days.  She was seen by her family doctor and had CT imaging performed as an outpatient.  It showed that she was constipated and had a fecal impaction.  She had called back to the PCPs office and they encouraged her to come to the emergency department for disimpaction.  The daughter thinks that perhaps it is due to being started on melatonin.  No vomiting no fevers.   Constipation      Home Medications Prior to Admission medications   Medication Sig Start Date End Date Taking? Authorizing Provider  acyclovir ointment (ZOVIRAX) 5 % Apply 1 application topically every 3 (three) hours. 02/01/18   Myrlene Broker, MD  albuterol (PROVENTIL) (2.5 MG/3ML) 0.083% nebulizer solution Take 3 mLs (2.5 mg total) by nebulization every 6 (six) hours as needed for wheezing or shortness of breath. 07/25/20   Olive Bass, FNP  albuterol (VENTOLIN HFA) 108 (90 Base) MCG/ACT inhaler Inhale 1-2 puffs into the lungs every 6 (six) hours as needed for wheezing or shortness of breath. 07/25/20   Olive Bass, FNP  aspirin 81 MG chewable tablet Chew 81 mg by mouth daily.    [provider]  Blood Glucose Monitoring Suppl (ONE TOUCH ULTRA MINI) w/Device KIT Use to check sugar 09/14/17   Myrlene Broker, MD  docusate sodium (COLACE) 100 MG capsule Take 1 capsule (100 mg total) by mouth daily. 12/21/20   Myrlene Broker, MD  fexofenadine (ALLEGRA) 180 MG tablet Take 1 tablet (180 mg total) by mouth daily. 03/06/21   Myrlene Broker, MD  fluticasone Aleda Grana) 50 MCG/ACT nasal spray Place 2 sprays into both nostrils daily. 08/08/22    Myrlene Broker, MD  fluticasone furoate-vilanterol (BREO ELLIPTA) 200-25 MCG/ACT AEPB Inhale 1 puff into the lungs daily. 05/14/23   Myrlene Broker, MD  glipiZIDE (GLIPIZIDE XL) 5 MG 24 hr tablet Take 1 tablet (5 mg total) by mouth daily with breakfast. 04/03/23   Myrlene Broker, MD  glucose blood (ONE TOUCH ULTRA TEST) test strip 1 each by Other route 2 (two) times daily. Use to check blood sugars twice a day Dx E11.9 10/29/17   Myrlene Broker, MD  JANUVIA 100 MG tablet TAKE ONE TABLET BY MOUTH EVERY DAY 04/01/23   Myrlene Broker, MD  Lancets [facility] ULTRASOFT) lancets Use as instructed 10/29/17   Myrlene Broker, MD  lidocaine (LIDODERM) 5 % APPLY 1 PATCH TO SKIN EVERY DAY (12 HOURS ON - 12 HOURS OFF) 01/02/23   Myrlene Broker, MD  LORazepam (ATIVAN) 0.5 MG tablet Take 1 tablet (0.5 mg total) by mouth 2 (two) times daily as needed for anxiety. 07/07/22   Myrlene Broker, MD  losartan-hydrochlorothiazide (HYZAAR) 100-12.5 MG tablet Take 1 tablet by mouth daily. 05/14/23   Myrlene Broker, MD  mirtazapine (REMERON) 15 MG tablet Take 1 tablet (15 mg total) by mouth at bedtime. 07/07/22   Myrlene Broker, MD  olopatadine (PATANOL) 0.1 % ophthalmic solution Place 1 drop into both eyes 2 (two) times daily as needed for allergies. 12/21/20  Myrlene Broker, MD      Allergies    Cinnamon, Codeine, Corn-containing products, Egg-derived products, Ezetimibe-simvastatin, Fish allergy, Fish oil, Hydrochlorothiazide w-triamterene, Influenza vaccines, Iodine, Isradipine, Lovastatin, Metformin, Orange concentrate [flavoring agent], Other, Peanut-containing drug products, Sulfadiazine, Sulfamethoxazole, Tape, Watermelon concentrate [citrullus vulgaris], Clonidine hydrochloride, Latex, and Statins    Review of Systems   Review of Systems  Gastrointestinal:  Positive for constipation.    Physical Exam Updated Vital Signs BP (!) 173/101 (BP  Location: Left Arm)   Pulse 60   Temp 99 F (37.2 C) (Oral)   Resp 18   Ht 5\' 3"  (1.6 m)   Wt 85.7 kg   LMP 03/19/1986   SpO2 98%   BMI 33.47 kg/m  Physical Exam Vitals and nursing note reviewed.  Constitutional:      General: She is not in acute distress.    Appearance: She is well-developed. She is not diaphoretic.  HENT:     Head: Normocephalic and atraumatic.  Eyes:     Pupils: Pupils are equal, round, and reactive to light.  Cardiovascular:     Rate and Rhythm: Normal rate and regular rhythm.     Heart sounds: No murmur heard.    No friction rub. No gallop.  Pulmonary:     Effort: Pulmonary effort is normal.     Breath sounds: No wheezing or rales.  Abdominal:     General: There is no distension.     Palpations: Abdomen is soft.     Tenderness: There is no abdominal tenderness.  Genitourinary:    Comments: Large stool burden in the rectum.  No obvious hemorrhoids. Musculoskeletal:        General: No tenderness.     Cervical back: Normal range of motion and neck supple.  Skin:    General: Skin is warm and dry.  Neurological:     Mental Status: She is alert and oriented to person, place, and time.  Psychiatric:        Behavior: Behavior normal.     ED Results / Procedures / Treatments   Labs (all labs ordered are listed, but only abnormal results are displayed) Labs Reviewed  BASIC METABOLIC PANEL - Abnormal; Notable for the following components:      Result Value   Chloride 97 (*)    Glucose, Bld 344 (*)    Calcium 8.8 (*)    All other components within normal limits  CBC WITH DIFFERENTIAL/PLATELET    EKG None  Radiology No results found.  Procedures Fecal disimpaction  Date/Time: 06/10/2023 5:11 PM  Performed by: Melene Plan, DO Authorized by: Melene Plan, DO  Consent: Verbal consent obtained. Risks and benefits: risks, benefits and alternatives were discussed Consent given by: patient and guardian (daughter) Imaging studies: imaging studies  available Patient identity confirmed: verbally with patient Time out: Immediately prior to procedure a "time out" was called to verify the correct patient, procedure, equipment, support staff and site/side marked as required. Preparation: Patient was prepped and draped in the usual sterile fashion. Local anesthesia used: no  Anesthesia: Local anesthesia used: no  Sedation: Patient sedated: no  Patient tolerance: patient tolerated the procedure well with no immediate complications       Medications Ordered in ED Medications - No data to display  ED Course/ Medical Decision Making/ A&P  Medical Decision Making Amount and/or Complexity of Data Reviewed Labs: ordered.   78 yo F with complaints of difficulty for difficulty moving her bowels.  Had recent CT imaging that was concerning for significant stool burden and fecal impaction.    Patient was disimpacted at bedside with a very large amount of stool removed.  Patient feeling quite a bit better on repeat assessment.  Will have her complete a MiraLAX cleanout at home.  PCP follow-up.  5:13 PM:  I have discussed the diagnosis/risks/treatment options with the patient and family.  Evaluation and diagnostic testing in the emergency department does not suggest an emergent condition requiring admission or immediate intervention beyond what has been performed at this time.  They will follow up with PCP. We also discussed returning to the ED immediately if new or worsening sx occur. We discussed the sx which are most concerning (e.g., sudden worsening pain, fever, inability to tolerate by mouth, vomiting, abdominal pain) that necessitate immediate return. Medications administered to the patient during their visit and any new prescriptions provided to the patient are listed below.  Medications given during this visit Medications - No data to display   The patient appears reasonably screen and/or stabilized  for discharge and I doubt any other medical condition or other [facility] requiring further screening, evaluation, or treatment in the ED at this time prior to discharge.          Final Clinical Impression(s) / ED Diagnoses Final diagnoses:  Fecal impaction in rectum [facility])    Rx / DC Orders ED Discharge Orders     None         Melene Plan, DO 06/10/23 1713

## 2023-06-10 NOTE — Telephone Encounter (Signed)
Patient's daughter called and said patient was seen by Dr. Lawerance Bach on 06/08/23 for constipation. They said she is still having trouble going to the bathroom. Patient has been taking magnesium citrate and Miralax. Patient also takes stool softener regularly. Patient is still straining with minimal success. Daughter is concerned and would like to know what they should do. [name] would like a call back at [phone].

## 2023-06-10 NOTE — ED Triage Notes (Signed)
Pt has had constipationx4d. Pt had CT of abdomen on Monday and they have tried all the suggestions what the PCP suggested to get the stool out, but they have on gotten little "squirts of stool out." Pt has pressure, but can't get the stool out.

## 2023-06-10 NOTE — ED Provider Triage Note (Signed)
Emergency Medicine Provider Triage Evaluation Note  EMIA MIGUES , a 77 y.o. female  was evaluated in triage.  Pt complains of concerns for constipation. Had a CT abdomen noted on 06/08/23 where it showed concerns for constipation.  Family notes that they have tried all the suggestions with her primary care doctor suggestion noted for her constipation however has only gotten wet flatulence.  Family notes last bowel movement was 5 days ago.  Patient denies abdominal pain.  However family notes that patient has had abdominal pain with bowel movements.  Review of Systems  Positive:  Negative:   Physical Exam  BP (!) 173/101 (BP Location: Left Arm)   Pulse 60   Temp 98.4 F (36.9 C)   Resp 18   Ht 5\' 3"  (1.6 m)   Wt 85.7 kg   LMP 03/19/1986   SpO2 98%   BMI 33.47 kg/m  Gen:   Awake, no distress   Resp:  Normal effort  MSK:   Moves extremities without difficulty  Other:  No abdominal tenderness to palpation  Medical Decision Making  Medically screening exam initiated at 2:45 PM.  Appropriate orders placed.  JENNIER FOURMAN was informed that the remainder of the evaluation will be completed by another provider, this initial triage assessment does not replace that evaluation, and the importance of remaining in the ED until their evaluation is complete.  Work up initiated.    Briceyda Abdullah A, PA-C 06/10/23 1453

## 2023-06-19 ENCOUNTER — Telehealth: Payer: Self-pay

## 2023-06-19 NOTE — Telephone Encounter (Signed)
 Transition Care Management Unsuccessful Follow-up Telephone Call  Date of discharge and from where:  [facility] 8/[facility]  Attempts:  1st Attempt  Reason for unsuccessful TCM follow-up call:  No answer/busy   Lenard Forth [place]  [facility], [facility] Guide, Phone: [phone] Website: [email or web address]

## 2023-06-26 ENCOUNTER — Other Ambulatory Visit: Payer: Self-pay | Admitting: Internal Medicine

## 2023-07-09 ENCOUNTER — Telehealth: Payer: Self-pay | Admitting: Internal Medicine

## 2023-07-09 NOTE — Telephone Encounter (Signed)
Patient daughter dropped off document Adult Day Care forms, to be filled out by provider. Patient daughter requested to send it back via Fax within 7-days fax number is [phone]. Document is located in providers tray at front office.Please advise at daughter Tashua Suplee [phone].

## 2023-07-10 ENCOUNTER — Ambulatory Visit: Payer: Medicare Other

## 2023-07-10 ENCOUNTER — Ambulatory Visit
Admission: EM | Admit: 2023-07-10 | Discharge: 2023-07-10 | Disposition: A | Discharge status: Home / Self Care | Payer: Medicare Other | Attending: Internal Medicine | Admitting: Internal Medicine

## 2023-07-10 DIAGNOSIS — M1811 Unilateral primary osteoarthritis of first carpometacarpal joint, right hand: Secondary | ICD-10-CM | POA: Diagnosis not present

## 2023-07-10 DIAGNOSIS — S62114A Nondisplaced fracture of triquetrum [cuneiform] bone, right wrist, initial encounter for closed fracture: Secondary | ICD-10-CM

## 2023-07-10 DIAGNOSIS — M25531 Pain in right wrist: Secondary | ICD-10-CM | POA: Diagnosis not present

## 2023-07-10 NOTE — Telephone Encounter (Signed)
Placed inside office box

## 2023-07-10 NOTE — ED Triage Notes (Signed)
Pt presents s/p fall with R wrist pain. Fall occurred yesterday after losing her balance. Was checked out by EMS and told BP was a little high.  Family told she landed on bottom. Pt has not c/o HA. Here for R wrist pain.  Some swelling noted. Poor historian d/t dementia. Son at bedside, unable to confirm allergies/meds but says they should be up to date with [facility]. Family states mental status today is at baseline.
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
## 2023-07-10 NOTE — Discharge Instructions (Signed)
There is a small fracture to the wrist.  Splint has been applied.  Do not remove this until otherwise advised by orthopedist.  Elevate and apply ice.  Follow-up with hand specialty for further evaluation and management.

## 2023-07-14 DIAGNOSIS — S62101A Fracture of unspecified carpal bone, right wrist, initial encounter for closed fracture: Secondary | ICD-10-CM | POA: Diagnosis not present

## 2023-07-15 DIAGNOSIS — S62101A Fracture of unspecified carpal bone, right wrist, initial encounter for closed fracture: Secondary | ICD-10-CM | POA: Diagnosis not present

## 2023-08-03 DIAGNOSIS — S62101A Fracture of unspecified carpal bone, right wrist, initial encounter for closed fracture: Secondary | ICD-10-CM | POA: Diagnosis not present

## 2023-08-18 DIAGNOSIS — S62101A Fracture of unspecified carpal bone, right wrist, initial encounter for closed fracture: Secondary | ICD-10-CM | POA: Diagnosis not present

## 2023-09-03 ENCOUNTER — Other Ambulatory Visit: Payer: Self-pay

## 2023-09-03 ENCOUNTER — Telehealth: Payer: Self-pay | Admitting: Internal Medicine

## 2023-09-03 MED ORDER — OLOPATADINE HCL 0.1 % OP SOLN: 1.0000 [drp] | Freq: Two times a day (BID) | OPHTHALMIC | 0 refills | Status: AC | PRN

## 2023-09-03 MED ORDER — LIDOCAINE 5 % EX PTCH: MEDICATED_PATCH | CUTANEOUS | 3 refills | Status: DC

## 2023-09-03 MED ORDER — GLIPIZIDE ER 5 MG PO TB24: 5.0000 mg | ORAL_TABLET | Freq: Every day | ORAL | 1 refills | Status: DC

## 2023-09-03 MED ORDER — LOSARTAN POTASSIUM-HCTZ 100-12.5 MG PO TABS: 1.0000 | ORAL_TABLET | Freq: Every day | ORAL | 1 refills | Status: DC

## 2023-09-03 MED ORDER — SITAGLIPTIN PHOSPHATE 100 MG PO TABS: 100.0000 mg | ORAL_TABLET | Freq: Every day | ORAL | 1 refills | Status: DC

## 2023-09-03 NOTE — Telephone Encounter (Signed)
Prescription Request  09/03/2023  LOV: 03/25/2023  What is the name of the medication or equipment?  lidocaine (LIDODERM) 5 %  losartan-hydrochlorothiazide (HYZAAR) 100-12.5 MG tablet JANUVIA 100 MG tablet glipiZIDE (GLIPIZIDE XL) 5 MG 24 hr tablet  Have you contacted your pharmacy to request a refill? Yes   Which pharmacy would you like this sent to?  [facility] - [place], [place] - [address] [facility] [address] [place] [address] Phone: [phone] Fax: [phone]    Patient notified that their request is being sent to the clinical staff for review and that they should receive a response within 2 business days.   Please advise at Mobile [phone] (mobile)

## 2023-09-04 ENCOUNTER — Other Ambulatory Visit: Payer: Self-pay | Admitting: Internal Medicine

## 2023-09-04 MED ORDER — LIDOCAINE 5 % EX PTCH: MEDICATED_PATCH | CUTANEOUS | 3 refills | Status: DC

## 2023-09-08 ENCOUNTER — Telehealth: Payer: Self-pay | Admitting: Internal Medicine

## 2023-09-08 ENCOUNTER — Other Ambulatory Visit: Payer: Self-pay

## 2023-09-08 DIAGNOSIS — S62101A Fracture of unspecified carpal bone, right wrist, initial encounter for closed fracture: Secondary | ICD-10-CM | POA: Diagnosis not present

## 2023-09-08 MED ORDER — GLIPIZIDE ER 5 MG PO TB24: 5.0000 mg | ORAL_TABLET | Freq: Every day | ORAL | 0 refills | Status: DC

## 2023-09-08 NOTE — Telephone Encounter (Signed)
Is this ok to do?

## 2023-09-08 NOTE — Telephone Encounter (Signed)
Fine to do 

## 2023-09-08 NOTE — Telephone Encounter (Signed)
Losartan and Glipizide was sent to [facility] - meds by mail on 11/14 - Can a 2 week dose be sent into [facility] on [address] - so patient will have enough until her medication arrives by mail.  Please call patient and advise.  [phone]

## 2023-09-25 DIAGNOSIS — M25531 Pain in right wrist: Secondary | ICD-10-CM | POA: Diagnosis not present

## 2023-09-25 DIAGNOSIS — M25631 Stiffness of right wrist, not elsewhere classified: Secondary | ICD-10-CM | POA: Diagnosis not present

## 2023-10-07 DIAGNOSIS — M25631 Stiffness of right wrist, not elsewhere classified: Secondary | ICD-10-CM | POA: Diagnosis not present

## 2023-10-07 DIAGNOSIS — M25531 Pain in right wrist: Secondary | ICD-10-CM | POA: Diagnosis not present

## 2023-10-20 DIAGNOSIS — M25531 Pain in right wrist: Secondary | ICD-10-CM | POA: Diagnosis not present

## 2023-10-20 DIAGNOSIS — M25631 Stiffness of right wrist, not elsewhere classified: Secondary | ICD-10-CM | POA: Diagnosis not present

## 2023-11-04 ENCOUNTER — Encounter: Payer: Self-pay | Admitting: Internal Medicine

## 2023-11-04 ENCOUNTER — Ambulatory Visit: Payer: Medicare Other | Admitting: Internal Medicine

## 2023-11-04 VITALS — BP 160/80 | HR 54 | Temp 98.5°F | Ht 63.0 in | Wt 183.0 lb

## 2023-11-04 DIAGNOSIS — Z7984 Long term (current) use of oral hypoglycemic drugs: Secondary | ICD-10-CM | POA: Diagnosis not present

## 2023-11-04 DIAGNOSIS — G300 Alzheimer's disease with early onset: Secondary | ICD-10-CM

## 2023-11-04 DIAGNOSIS — F02B18 Dementia in other diseases classified elsewhere, moderate, with other behavioral disturbance: Secondary | ICD-10-CM | POA: Diagnosis not present

## 2023-11-04 DIAGNOSIS — J4530 Mild persistent asthma, uncomplicated: Secondary | ICD-10-CM | POA: Diagnosis not present

## 2023-11-04 DIAGNOSIS — I10 Essential (primary) hypertension: Secondary | ICD-10-CM

## 2023-11-04 DIAGNOSIS — E118 Type 2 diabetes mellitus with unspecified complications: Secondary | ICD-10-CM

## 2023-11-04 LAB — COMPREHENSIVE METABOLIC PANEL
ALT: 17 U/L (ref 0–35)
AST: 16 U/L (ref 0–37)
Albumin: 4.1 g/dL (ref 3.5–5.2)
Alkaline Phosphatase: 81 U/L (ref 39–117)
BUN: 12 mg/dL (ref 6–23)
CO2: 34 meq/L — ABNORMAL HIGH (ref 19–32)
Calcium: 9.3 mg/dL (ref 8.4–10.5)
Chloride: 102 meq/L (ref 96–112)
Creatinine, Ser: 0.9 mg/dL (ref 0.40–1.20)
GFR: 61.9 mL/min (ref >60.00)
Glucose, Bld: 147 mg/dL — ABNORMAL HIGH (ref 70–99)
Potassium: 3.7 meq/L (ref 3.5–5.1)
Sodium: 145 meq/L (ref 135–145)
Total Bilirubin: 0.5 mg/dL (ref 0.2–1.2)
Total Protein: 6.9 g/dL (ref 6.0–8.3)

## 2023-11-04 LAB — CBC
HCT: 41.1 % (ref 36.0–46.0)
Hemoglobin: 13.4 g/dL (ref 12.0–15.0)
MCHC: 32.7 g/dL (ref 30.0–36.0)
MCV: 85.4 fL (ref 78.0–100.0)
Platelets: 205 10*3/uL (ref 150.0–400.0)
RBC: 4.82 Mil/uL (ref 3.87–5.11)
RDW: 13.6 % (ref 11.5–15.5)
WBC: 9.2 10*3/uL (ref 4.0–10.5)

## 2023-11-04 LAB — HEMOGLOBIN A1C: Hgb A1c MFr Bld: 9.9 % — ABNORMAL HIGH (ref 4.6–6.5)

## 2023-11-04 NOTE — Assessment & Plan Note (Signed)
 Prior HgA1c not at goal and is prescribed glipizide  5 mg daily and januvia  100 mg daily. Likely not taking as prescribed given fill pattern. Checking HgA1c today but given advanced dementia compliance with regimen may not be achievable and this may be best control we can get.

## 2023-11-04 NOTE — Assessment & Plan Note (Signed)
 Overall mildly progressive and having some behavioral issues. Using lorazepam  0.5 mg BID prn for anxiety or agitation. Is on mirtazapine  15 mg at bedtime to help with mood and sleep which helps some.

## 2023-11-04 NOTE — Assessment & Plan Note (Signed)
 No flare today is using albuterol  as needed and breo. Unclear compliance on breo but uses with symptoms consistently.

## 2023-11-04 NOTE — Assessment & Plan Note (Signed)
 BP mildly high and she is taking losartan /hydrochlorothiazide  100/12.5 mg daily for BP which she did not take today. Is typically normal at home.

## 2023-11-04 NOTE — Progress Notes (Signed)
   Subjective:   [name], female    DOB: 1946-09-29, 78 y.o.   MRN: [id number]  HPI The patient is a 78 YO female coming in for medical management with family to help provide history. See A/P for details and management.   Review of Systems  Constitutional: Negative.   HENT: Negative.    Eyes: Negative.   Respiratory:  Negative for cough, chest tightness and shortness of breath.   Cardiovascular:  Negative for chest pain, palpitations and leg swelling.  Gastrointestinal:  Negative for abdominal distention, abdominal pain, constipation, diarrhea, nausea and vomiting.  Musculoskeletal: Negative.   Skin: Negative.   Neurological: Negative.   Psychiatric/Behavioral: Negative.      Objective:  Physical Exam Constitutional:      Appearance: She is well-developed.  HENT:     Head: Normocephalic and atraumatic.  Cardiovascular:     Rate and Rhythm: Normal rate and regular rhythm.  Pulmonary:     Effort: Pulmonary effort is normal. No respiratory distress.     Breath sounds: Normal breath sounds. No wheezing or rales.  Abdominal:     General: Bowel sounds are normal. There is no distension.     Palpations: Abdomen is soft.     Tenderness: There is no abdominal tenderness. There is no rebound.  Musculoskeletal:     Cervical back: Normal range of motion.  Skin:    General: Skin is warm and dry.  Neurological:     Mental Status: She is alert.     Coordination: Coordination normal.     Vitals:   11/04/23 0856 11/04/23 0902  BP: (!) 160/80 (!) 160/80  Pulse: (!) 54   Temp: 98.5 F (36.9 C)   TempSrc: Oral   SpO2: 97%   Weight: 183 lb (83 kg)   Height: 5\' 3"  (1.6 m)     Assessment & Plan:

## 2023-11-05 ENCOUNTER — Telehealth: Payer: Self-pay

## 2023-11-05 DIAGNOSIS — M25631 Stiffness of right wrist, not elsewhere classified: Secondary | ICD-10-CM | POA: Diagnosis not present

## 2023-11-05 DIAGNOSIS — M25531 Pain in right wrist: Secondary | ICD-10-CM | POA: Diagnosis not present

## 2023-11-05 NOTE — Telephone Encounter (Signed)
Copied from CRM [id number]. Topic: Clinical - Medical Advice >> Nov 05, 2023  1:57 PM Elizebeth Brooking wrote: Reason for CRM: Patient daugther called regarding to patients test results CO2 is high and what the next steps are for this. She is requesting a callback at [phone]

## 2023-11-06 NOTE — Telephone Encounter (Signed)
Please inform normal turnaround for lab results. I have not reviewed yet

## 2023-11-06 NOTE — Telephone Encounter (Signed)
I did not see results in our basket please advise

## 2023-11-09 ENCOUNTER — Encounter: Payer: Self-pay | Admitting: Internal Medicine

## 2023-11-11 ENCOUNTER — Telehealth: Payer: Self-pay | Admitting: Internal Medicine

## 2023-11-11 NOTE — Telephone Encounter (Signed)
Patient's daughter dropped off form from nursing home - please fax back to :  [phone]

## 2023-11-11 NOTE — Telephone Encounter (Signed)
Patient's daughter will call back with a fax number - after receiving please route to Dr. Frutoso Chase nurse - form placed in Dr. Frutoso Chase box up front

## 2023-11-16 ENCOUNTER — Ambulatory Visit: Payer: Self-pay | Admitting: Internal Medicine

## 2023-11-16 DIAGNOSIS — J4530 Mild persistent asthma, uncomplicated: Secondary | ICD-10-CM

## 2023-11-16 NOTE — Telephone Encounter (Signed)
Copied from CRM [id number]. Topic: Clinical - Red Word Triage >> Nov 16, 2023  8:08 AM Marica Otter wrote: [facility] that prompted transfer to Nurse Triage: Patients daughter is calling stating mom is having an asthma flare up. Wheezing and coughing up clear mucus.   Chief Complaint: wheesing; asthma Symptoms: wheezing, cough, runny nose Frequency: intermittent Pertinent Negatives: Patient denies fever Disposition: [] ED /[] Urgent Care (no appt availability in office) / [x] Appointment(In office/virtual)/ []  [facility]/ [] Home Care/ [] Refused Recommended Disposition /[] [facility]/ []  Follow-up with PCP Additional Notes: spoke with daughter, pt with mild asthma flare that started on Thursday (4 days ago). Being treated at home with breathing treatments, flonase, and Breo. Per daughter, pt symptoms are improving, but she still wants her to get seen in case a steroid is needed. Appt scheduled for 01/28    Reason for Disposition  [1] MILD asthma attack (e.g., no SOB at rest, mild SOB with walking, speaks normally in sentences, mild wheezing) AND [2] lasting > 24 hours on prescribed treatment  Answer Assessment - Initial Assessment Questions 1. RESPIRATORY STATUS: "Describe your breathing?" (e.g., wheezing, shortness of breath, unable to speak, severe coughing)      Wheezing, coughing-has gotten better with breathing treatment  2. ONSET: "When did this asthma attack begin?"      Started 4 days ago  3. TRIGGER: "What do you think triggered this attack?" (e.g., URI, exposure to pollen or other allergen, tobacco smoke)      Cold weather, possibly dust in the home  4. PEAK EXPIRATORY FLOW RATE (PEFR): "Do you use a peak flow meter?" If Yes, ask: "What's the current peak flow? What's your personal best peak flow?"      No, does not have one at home  5. SEVERITY: "How bad is this attack?"    - MILD: No SOB at rest, mild SOB with walking, speaks normally in sentences, can lie  down, no retractions, pulse < 100. (GREEN Zone: PEFR 80-100%)   - MODERATE: SOB at rest, SOB with minimal exertion and prefers to sit, cannot lie down flat, speaks in phrases, mild retractions, audible wheezing, pulse 100-120. (YELLOW Zone: PEFR 50-79%)    - SEVERE: Struggling for each breath, speaks in single words, struggling to breathe, sitting hunched forward, retractions, usually loud wheezing, sometimes minimal wheezing because of decreased air movement, pulse > 120. (RED Zone: PEFR < 50%).      Mild shortness of breath  6. ASTHMA MEDICINES:  "What treatments have you tried?"    - INHALED QUICK RELIEF (RESCUE): "What is your inhaled quick-relief medicine?" (e.g., albuterol, salbutamol) "Do you use an inhaler or a nebulizer?" "How frequently have you been using this medicine?"   - CONTROLLER (LONG-TERM-CONTROL): "Do you take an inhaled steroid? (e.g., Asmanex, Flovent, Pulmicort, Qvar)     Took the Breo and given breathing treatment 2 mornings in a row  7. INHALED QUICK-RELIEF TREATMENTS FOR THIS ATTACK: "What treatments have you given yourself so far?" and "How many and how often?" If using an inhaler, ask, "How many puffs?" Note: Routine treatments are 2 puffs every 4 hours as needed. Rescue treatments are 4 puffs repeated every 20 minutes, up to three times as needed.      Albuterol treatments  8. OTHER SYMPTOMS: "Do you have any other symptoms? (e.g., chest pain, coughing up yellow sputum, fever, runny nose)     Cough, runny nose  9. O2 SATURATION MONITOR:  "Do you use an oxygen saturation  monitor (pulse oximeter) at home?" If Yes, "What is your reading (oxygen level) today?" "What is your usual oxygen saturation reading?" (e.g., 95%)     Does not have one at home  10. PREGNANCY: "Is there any chance you are pregnant?" "When was your last menstrual period?"       N/a  Protocols used: Asthma Attack-A-AH

## 2023-11-17 ENCOUNTER — Encounter: Payer: Self-pay | Admitting: Internal Medicine

## 2023-11-17 ENCOUNTER — Ambulatory Visit (INDEPENDENT_AMBULATORY_CARE_PROVIDER_SITE_OTHER): Payer: Medicare Other | Admitting: Internal Medicine

## 2023-11-17 VITALS — BP 138/80 | HR 52 | Temp 98.7°F | Ht 63.0 in | Wt 179.4 lb

## 2023-11-17 DIAGNOSIS — J4530 Mild persistent asthma, uncomplicated: Secondary | ICD-10-CM

## 2023-11-17 DIAGNOSIS — J4531 Mild persistent asthma with (acute) exacerbation: Secondary | ICD-10-CM

## 2023-11-17 MED ORDER — PREDNISONE 20 MG PO TABS: 40.0000 mg | ORAL_TABLET | Freq: Every day | ORAL | 0 refills | Status: AC

## 2023-11-17 MED ORDER — ALBUTEROL SULFATE HFA 108 (90 BASE) MCG/ACT IN AERS: 1.0000 | INHALATION_SPRAY | Freq: Four times a day (QID) | RESPIRATORY_TRACT | 1 refills | Status: AC | PRN

## 2023-11-17 NOTE — Patient Instructions (Signed)
We have sent in the prednisone to take if needed.

## 2023-11-17 NOTE — Progress Notes (Signed)
   Subjective:   [name], female    DOB: [birth year], 78 y.o.   MRN: [id number]  HPI The patient is a 78 YO female coming in for cough and flu like symptoms for 3 days. Using albuterol and nebulizer more.   Review of Systems  Constitutional:  Positive for activity change, appetite change and chills. Negative for fatigue, fever and unexpected weight change.  HENT:  Positive for congestion, postnasal drip, rhinorrhea and sinus pressure. Negative for ear discharge, ear pain, sinus pain, sneezing, sore throat, tinnitus, trouble swallowing and voice change.   Eyes: Negative.   Respiratory:  Positive for cough, shortness of breath and wheezing. Negative for chest tightness.   Cardiovascular: Negative.   Gastrointestinal: Negative.   Musculoskeletal:  Positive for myalgias.  Neurological: Negative.     Objective:  Physical Exam Constitutional:      Appearance: She is well-developed.  HENT:     Head: Normocephalic and atraumatic.     Comments: Oropharynx with redness and clear drainage, nose with swollen turbinates, TMs normal bilaterally.  Neck:     Thyroid: No thyromegaly.  Cardiovascular:     Rate and Rhythm: Normal rate and regular rhythm.  Pulmonary:     Effort: Pulmonary effort is normal. No respiratory distress.     Breath sounds: Wheezing present. No rales.  Abdominal:     Palpations: Abdomen is soft.  Musculoskeletal:        General: Tenderness present.     Cervical back: Normal range of motion.  Lymphadenopathy:     Cervical: No cervical adenopathy.  Skin:    General: Skin is warm and dry.  Neurological:     Mental Status: She is alert and oriented to person, place, and time.     Vitals:   11/17/23 0956  BP: 138/80  Pulse: (!) 52  Temp: 98.7 F (37.1 C)  SpO2: 97%  Weight: 179 lb 6.4 oz (81.4 kg)  Height: 5\' 3"  (1.6 m)    Assessment & Plan:

## 2023-11-18 ENCOUNTER — Ambulatory Visit: Payer: Self-pay | Admitting: Internal Medicine

## 2023-11-18 DIAGNOSIS — M25631 Stiffness of right wrist, not elsewhere classified: Secondary | ICD-10-CM | POA: Diagnosis not present

## 2023-11-18 DIAGNOSIS — M25531 Pain in right wrist: Secondary | ICD-10-CM | POA: Diagnosis not present

## 2023-11-18 NOTE — Telephone Encounter (Signed)
Copied from CRM [id number]. Topic: Clinical - Medical Advice >> Nov 18, 2023  7:47 AM Pascal Lux wrote: Reason for CRM: Patient daughter Pamela Morgan called asking what can she use to treat the cold sore that is forming under the patient noose and lip.   Chief Complaint: cold sores Symptoms: slight swelling of bump on lip Frequency: appeared today Pertinent Negatives: Patient denies fever Disposition: [] ED /[] Urgent Care (no appt availability in office) / [x] Appointment(In office/virtual)/ []  [facility]/ [] Home Care/ [] Refused Recommended Disposition /[] [facility]/ []  Follow-up with PCP Additional Notes: The patient's daughter reported that her mom has been sick with a runny nose an asthma flare and has been blowing and wiping her nose frequently.  She was seen at an office visit yesterday and prescribed an inhaler and prednisone to address these concerns.  This morning she noticed two small bumps below her nose and one larger bump, smaller than a dime on her lip that is red.  There is no scab present.  She inquired what medicine she should put on it.  She was advised to use Abreva up to 5 times daily until it is healed.  Advised to call back if redness or swelling spreads.  Routed to pcp for awareness.   Reason for Disposition  Cold sores without complications  Answer Assessment - Initial Assessment Questions 1. APPEARANCE of BLISTERS: "Describe the sores."     2 small dots under nose, one dot over lip slightly swollen in a circle  2. SIZE: "How large an area is involved with the cold sores?" (e.g., inches, cm or compare to coins)     Swollen smaller than a dime on lip  3. LOCATION: "Which part of the lip is involved?"     Under nose and lip  4. ONSET: "When did the fever blisters begin?"     today 5. RECURRENT BLISTERS: "Have you had fever blisters before?" If Yes, ask: "When was the last time?" "How many times a year?"     Yes maybe a year ago  6. OTHER SYMPTOMS: "Do you  have any other symptoms?" (e.g., fever, sores inside mouth)     Runny nose and asthma flare addressed in office visit yesterday inhaler and prednisone  Protocols used: Cold Sores (Fever Blisters)-A-AH

## 2023-11-20 NOTE — Assessment & Plan Note (Signed)
With flare today and rx prednisone. Likely flu or flu-like. Refill albuterol inhaler as they are out.

## 2023-12-03 ENCOUNTER — Ambulatory Visit: Payer: Medicare Other

## 2023-12-03 VITALS — Ht 63.0 in | Wt 179.0 lb

## 2023-12-03 DIAGNOSIS — Z0000 Encounter for general adult medical examination without abnormal findings: Secondary | ICD-10-CM | POA: Diagnosis not present

## 2023-12-03 NOTE — Patient Instructions (Addendum)
Pamela Morgan , Thank you for taking time to come for your Medicare Wellness Visit. I appreciate your ongoing commitment to your health goals. Please review the following plan we discussed and let me know if I can assist you in the future.   Referrals/Orders/Follow-Ups/Clinician Recommendations: Aim for 30 minutes of exercise or brisk walking, 6-8 glasses of water, and 5 servings of fruits and vegetables each day.   This is a list of the screening recommended for you and due dates:  Health Maintenance  Topic Date Due   Zoster (Shingles) Vaccine (1 of 2) Never done   DTaP/Tdap/Td vaccine (2 - Tdap) 10/20/2020   Eye exam for diabetics  03/19/2023   COVID-19 Vaccine (1 - 2024-25 season) Never done   Yearly kidney health urinalysis for diabetes  03/24/2024   Complete foot exam   03/24/2024   Hemoglobin A1C  05/03/2024   Yearly kidney function blood test for diabetes  11/03/2024   Medicare Annual Wellness Visit  12/02/2024   Pneumonia Vaccine  Completed   DEXA scan (bone density measurement)  Completed   Hepatitis C Screening  Completed   HPV Vaccine  Aged Out   Colon Cancer Screening  Discontinued    Advanced directives: (Copy Requested) Please bring a copy of your health care power of attorney and living will to the office to be added to your chart at your convenience.  Next Medicare Annual Wellness Visit scheduled for next year: Yes - 12/05/2024
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
## 2024-01-19 ENCOUNTER — Encounter (HOSPITAL_BASED_OUTPATIENT_CLINIC_OR_DEPARTMENT_OTHER): Payer: Self-pay | Admitting: *Deleted

## 2024-01-19 ENCOUNTER — Emergency Department (HOSPITAL_BASED_OUTPATIENT_CLINIC_OR_DEPARTMENT_OTHER)

## 2024-01-19 ENCOUNTER — Emergency Department (HOSPITAL_BASED_OUTPATIENT_CLINIC_OR_DEPARTMENT_OTHER)
Admission: EM | Admit: 2024-01-19 | Discharge: 2024-01-19 | Disposition: A | Discharge status: Home / Self Care | Attending: Emergency Medicine | Admitting: Emergency Medicine

## 2024-01-19 ENCOUNTER — Ambulatory Visit: Admitting: Physician Assistant

## 2024-01-19 ENCOUNTER — Ambulatory Visit: Payer: Self-pay

## 2024-01-19 ENCOUNTER — Other Ambulatory Visit: Payer: Self-pay

## 2024-01-19 DIAGNOSIS — S0012XA Contusion of left eyelid and periocular area, initial encounter: Secondary | ICD-10-CM | POA: Diagnosis not present

## 2024-01-19 DIAGNOSIS — F0390 Unspecified dementia without behavioral disturbance: Secondary | ICD-10-CM | POA: Insufficient documentation

## 2024-01-19 DIAGNOSIS — S0083XA Contusion of other part of head, initial encounter: Secondary | ICD-10-CM | POA: Diagnosis not present

## 2024-01-19 DIAGNOSIS — M5030 Other cervical disc degeneration, unspecified cervical region: Secondary | ICD-10-CM | POA: Diagnosis not present

## 2024-01-19 DIAGNOSIS — Z7982 Long term (current) use of aspirin: Secondary | ICD-10-CM | POA: Diagnosis not present

## 2024-01-19 DIAGNOSIS — Z91040 Latex allergy status: Secondary | ICD-10-CM | POA: Diagnosis not present

## 2024-01-19 DIAGNOSIS — S199XXA Unspecified injury of neck, initial encounter: Secondary | ICD-10-CM | POA: Diagnosis not present

## 2024-01-19 DIAGNOSIS — T148XXA Other injury of unspecified body region, initial encounter: Secondary | ICD-10-CM

## 2024-01-19 DIAGNOSIS — W01198A Fall on same level from slipping, tripping and stumbling with subsequent striking against other object, initial encounter: Secondary | ICD-10-CM | POA: Diagnosis not present

## 2024-01-19 DIAGNOSIS — Y92210 Daycare center as the place of occurrence of the external cause: Secondary | ICD-10-CM | POA: Diagnosis not present

## 2024-01-19 DIAGNOSIS — Z91010 Allergy to peanuts: Secondary | ICD-10-CM | POA: Diagnosis not present

## 2024-01-19 DIAGNOSIS — S0990XA Unspecified injury of head, initial encounter: Secondary | ICD-10-CM | POA: Diagnosis not present

## 2024-01-19 DIAGNOSIS — M4802 Spinal stenosis, cervical region: Secondary | ICD-10-CM | POA: Diagnosis not present

## 2024-01-19 DIAGNOSIS — W19XXXA Unspecified fall, initial encounter: Secondary | ICD-10-CM

## 2024-01-19 HISTORY — DX: Unspecified dementia, unspecified severity, without behavioral disturbance, psychotic disturbance, mood disturbance, and anxiety: F03.90

## 2024-01-19 NOTE — ED Notes (Signed)
Pt was taken to CT

## 2024-01-19 NOTE — Telephone Encounter (Signed)
  Chief Complaint: tripped over curb Symptoms: lemon sized lump forehead Frequency: 1600 today  Pertinent Negatives: Patient denies LOC, nausea Disposition: [] ED /[] Urgent Care (no appt availability in office) / [] Appointment(In office/virtual)/ []  [place] [facility]/ [] Home Care/ [] Refused Recommended Disposition /[x] [place] [facility]/ []  Follow-up with PCP Additional Notes:  Copied from CRM [id number]. Topic: Clinical - Red Word Triage >> Jan 19, 2024  4:09 PM Shereese L wrote: [facility] that prompted transfer to Nurse Triage:  fell/dementia/dizzy [name] called and stated mother fell and has a knot on the front of her forehead. She has dementia Reason for Disposition  [1] Age over 64 years AND [2] swelling or bruise  Answer Assessment - Initial Assessment Questions 1. MECHANISM: "How did the injury happen?" For falls, ask: "What height did you fall from?" and "What surface did you fall against?"      Trip off curb  2. ONSET: "When did the injury happen?" (Minutes or hours ago)      1600 3. NEUROLOGIC SYMPTOMS: "Was there any loss of consciousness?" "Are there any other neurological symptoms?"      No/ alert  4. MENTAL STATUS: "Does the person know who they are, who you are, and where they are?"      Dementia- know that is her daughter  15. LOCATION: "What part of the head was hit?"      Mid forehead  6. SCALP APPEARANCE: "What does the scalp look like? Is it bleeding now?" If Yes, ask: "Is it difficult to stop?"      Lemon sized swelling  7. SIZE: For cuts, bruises, or swelling, ask: "How large is it?" (e.g., inches or centimeters)      Scratches to hand scratch to lump 8. PAIN: "Is there any pain?" If Yes, ask: "How bad is it?"  (e.g., Scale 1-10; or mild, moderate, severe)     Hurts moderate 9. TETANUS: For any breaks in the skin, ask: "When was the last tetanus booster?"     *No Answer* 10. OTHER SYMPTOMS: "Do you have any other symptoms?" (e.g., neck pain, vomiting)        *No Answer*  Protocols used: Head Injury-A-AH

## 2024-01-19 NOTE — ED Provider Notes (Signed)
 [place] [facility] Provider Note   CSN: [id number] Arrival date & time: 01/19/24  1802     History  Chief Complaint  Patient presents with   Pamela Morgan is a 78 y.o. female with a history of dementia presenting from adult daycare in the company of her family members with concern for a mechanical fall today, tripping and striking her forehead on the ground, catching herself on her outstretched hands.  The patient herself is a level 5 caveat due to dementia, but she denies to me that she is now having a headache, blurred vision or neck pain.  She is not on blood thinners according to her family.  HPI     Home Medications Prior to Admission medications   Medication Sig Start Date End Date Taking? Authorizing Provider  acyclovir ointment (ZOVIRAX) 5 % Apply 1 application topically every 3 (three) hours. 02/01/18   Myrlene Broker, MD  albuterol (PROVENTIL) (2.5 MG/3ML) 0.083% nebulizer solution Take 3 mLs (2.5 mg total) by nebulization every 6 (six) hours as needed for wheezing or shortness of breath. 07/25/20   Olive Bass, FNP  albuterol (VENTOLIN HFA) 108 (90 Base) MCG/ACT inhaler Inhale 1-2 puffs into the lungs every 6 (six) hours as needed for wheezing or shortness of breath. 11/17/23   Myrlene Broker, MD  aspirin 81 MG chewable tablet Chew 81 mg by mouth daily.    [provider]  Blood Glucose Monitoring Suppl (ONE TOUCH ULTRA MINI) w/Device KIT Use to check sugar 09/14/17   Myrlene Broker, MD  docusate sodium (COLACE) 100 MG capsule Take 1 capsule (100 mg total) by mouth daily. 12/21/20   Myrlene Broker, MD  fexofenadine (ALLEGRA) 180 MG tablet Take 1 tablet (180 mg total) by mouth daily. 03/06/21   Myrlene Broker, MD  fluticasone Aleda Grana) 50 MCG/ACT nasal spray INSTILL 2 SPRAYS IN [facility] NOSTRIL EVERY DAY 06/26/23   Myrlene Broker, MD  fluticasone furoate-vilanterol (BREO ELLIPTA)  200-25 MCG/ACT AEPB Inhale 1 puff into the lungs daily. 05/14/23   Myrlene Broker, MD  glipiZIDE (GLIPIZIDE XL) 5 MG 24 hr tablet Take 1 tablet (5 mg total) by mouth daily with breakfast. 09/08/23   Myrlene Broker, MD  glucose blood (ONE TOUCH ULTRA TEST) test strip 1 each by Other route 2 (two) times daily. Use to check blood sugars twice a day Dx E11.9 10/29/17   Myrlene Broker, MD  Lancets [facility] ULTRASOFT) lancets Use as instructed 10/29/17   Myrlene Broker, MD  lidocaine (LIDODERM) 5 % APPLY 1 PATCH TO SKIN EVERY DAY (12 HOURS ON - 12 HOURS OFF) 09/04/23   Myrlene Broker, MD  losartan-hydrochlorothiazide (HYZAAR) 100-12.5 MG tablet Take 1 tablet by mouth daily. 09/03/23   Myrlene Broker, MD  mirtazapine (REMERON) 15 MG tablet Take 1 tablet (15 mg total) by mouth at bedtime. 07/07/22   Myrlene Broker, MD  olopatadine (PATANOL) 0.1 % ophthalmic solution Place 1 drop into both eyes 2 (two) times daily as needed for allergies. 09/03/23   Myrlene Broker, MD  sitaGLIPtin (JANUVIA) 100 MG tablet Take 1 tablet (100 mg total) by mouth daily. 09/03/23   Myrlene Broker, MD      Allergies    Cinnamon, Codeine, Corn-containing products, Egg-derived products, Ezetimibe-simvastatin, Fish allergy, Fish oil, Hydrochlorothiazide w-triamterene, Influenza vaccines, Iodine, Isradipine, Lovastatin, Metformin, Orange concentrate [flavoring agent (non-screening)], Other, Peanut-containing drug products, Sulfadiazine,  Sulfamethoxazole, Tape, Watermelon concentrate [citrullus vulgaris], Clonidine hydrochloride, Latex, and Statins    Review of Systems   Review of Systems  Physical Exam Updated Vital Signs BP (!) 191/66 (BP Location: Right Arm)   Pulse (!) 53   Temp 98.6 F (37 C)   Resp 18   LMP 03/19/1986   SpO2 96%  Physical Exam Constitutional:      General: She is not in acute distress. HENT:     Head: Normocephalic.     Comments: Large  hematoma overlying the left eyebrow Eyes:     Conjunctiva/sclera: Conjunctivae normal.     Pupils: Pupils are equal, round, and reactive to light.  Neck:     Comments: No spinal midline tenderness Cardiovascular:     Rate and Rhythm: Normal rate and regular rhythm.  Pulmonary:     Effort: Pulmonary effort is normal. No respiratory distress.  Abdominal:     General: There is no distension.     Tenderness: There is no abdominal tenderness.  Musculoskeletal:     Comments: Full range of motion of all the joints including the hips and the wrist, no significant tenderness or visible deformity  Skin:    General: Skin is warm and dry.  Neurological:     General: No focal deficit present.     Mental Status: She is alert. Mental status is at baseline.     ED Results / Procedures / Treatments   Labs (all labs ordered are listed, but only abnormal results are displayed) Labs Reviewed - No data to display  EKG None  Radiology CT Cervical Spine Wo Contrast Result Date: 01/19/2024 CLINICAL DATA:  Neck trauma (Age >= 65y).  Fall EXAM: CT CERVICAL SPINE WITHOUT CONTRAST TECHNIQUE: Multidetector CT imaging of the cervical spine was performed without intravenous contrast. Multiplanar CT image reconstructions were also generated. RADIATION DOSE REDUCTION: This exam was performed according to the departmental dose-optimization program which includes automated exposure control, adjustment of the mA and/or kV according to patient size and/or use of iterative reconstruction technique. COMPARISON:  10/14/2019 FINDINGS: Alignment: Normal alignment. Skull base and vertebrae: No acute fracture. No primary bone lesion or focal pathologic process. Soft tissues and spinal canal: No prevertebral fluid or swelling. No visible canal hematoma. Disc levels: Diffuse degenerative disc disease with disc space narrowing and spurring. Moderate bilateral degenerative facet disease diffusely. Multilevel bilateral neural  foraminal narrowing. Upper chest: No acute findings. Other: None IMPRESSION: Degenerative disc and facet disease.  No acute bony abnormality. Electronically Signed   By: Charlett Nose M.D.   On: 01/19/2024 19:33   CT Head Wo Contrast Result Date: 01/19/2024 CLINICAL DATA:  Head trauma, minor (Age >= 65y). Fall. Hit forehead. EXAM: CT HEAD WITHOUT CONTRAST TECHNIQUE: Contiguous axial images were obtained from the base of the skull through the vertex without intravenous contrast. RADIATION DOSE REDUCTION: This exam was performed according to the departmental dose-optimization program which includes automated exposure control, adjustment of the mA and/or kV according to patient size and/or use of iterative reconstruction technique. COMPARISON:  01/30/2021 FINDINGS: Brain: There is atrophy and chronic small vessel disease changes. No acute intracranial abnormality. Specifically, no hemorrhage, hydrocephalus, mass lesion, acute infarction, or significant intracranial injury. Vascular: No hyperdense vessel or unexpected calcification. Skull: No acute calvarial abnormality. Sinuses/Orbits: No acute findings Other: Soft tissue swelling in the forehead region. IMPRESSION: Atrophy, chronic microvascular disease. No acute intracranial abnormality. Electronically Signed   By: Charlett Nose M.D.   On: 01/19/2024 19:32  DG Wrist Complete Left Result Date: 01/19/2024 CLINICAL DATA:  Fall and trauma to the left wrist.  Pain. EXAM: LEFT WRIST - COMPLETE 3+ VIEW COMPARISON:  None Available. FINDINGS: No acute fracture or dislocation. Mild osteopenia. No significant arthritic change. The soft tissue swelling of the dorsum of the distal forearm. No radiopaque foreign object or soft tissue gas. IMPRESSION: 1. No acute fracture or dislocation. 2. Soft tissue swelling. Electronically Signed   By: Elgie Collard M.D.   On: 01/19/2024 19:24    Procedures Procedures    Medications Ordered in ED Medications - No data to  display  ED Course/ Medical Decision Making/ A&P                                 Medical Decision Making Amount and/or Complexity of Data Reviewed Radiology: ordered.   Patient is here with a witnessed mechanical fall earlier today.  She is a hematoma to the forehead.  CT imaging of the head and C-spine ordered and proceed with interpreted, notable for no emergent findings.  X-ray of the wrist was also reviewed with no emergent findings or fracture.  Patient is otherwise behaving normally according to her family, and stable for discharge home.  I did recommend ice to the hematoma and advised that this may take several days or even weeks to fully resolve.        Final Clinical Impression(s) / ED Diagnoses Final diagnoses:  Fall, initial encounter  Hematoma    Rx / DC Orders ED Discharge Orders     None         Jalee Saine, Kermit Balo, MD 01/19/24 1946

## 2024-01-19 NOTE — Progress Notes (Signed)
 Patient was briefly evaluated while still in her vehicle.  Recommended to go to ED for prompt evaluation. Recent fall, patient unable to verbalize symptoms due to dementia. Daughter is present and understands and agrees and will take her to ED.

## 2024-01-19 NOTE — ED Triage Notes (Signed)
 Pt tripped and fell during her adult daycare today.  She struck her forehead and has some swelling.  No LOC, not on any blood thinners

## 2024-01-25 ENCOUNTER — Telehealth: Payer: Self-pay

## 2024-01-25 ENCOUNTER — Ambulatory Visit (INDEPENDENT_AMBULATORY_CARE_PROVIDER_SITE_OTHER): Admitting: Family Medicine

## 2024-01-25 ENCOUNTER — Encounter: Payer: Self-pay | Admitting: Family Medicine

## 2024-01-25 VITALS — BP 142/84 | HR 50 | Temp 97.7°F | Ht 63.0 in | Wt 173.0 lb

## 2024-01-25 DIAGNOSIS — F02B18 Dementia in other diseases classified elsewhere, moderate, with other behavioral disturbance: Secondary | ICD-10-CM | POA: Diagnosis not present

## 2024-01-25 DIAGNOSIS — W19XXXD Unspecified fall, subsequent encounter: Secondary | ICD-10-CM | POA: Diagnosis not present

## 2024-01-25 DIAGNOSIS — I10 Essential (primary) hypertension: Secondary | ICD-10-CM

## 2024-01-25 DIAGNOSIS — G300 Alzheimer's disease with early onset: Secondary | ICD-10-CM

## 2024-01-25 DIAGNOSIS — E118 Type 2 diabetes mellitus with unspecified complications: Secondary | ICD-10-CM

## 2024-01-25 NOTE — Patient Instructions (Addendum)
 I have sent a referral for you to see our pharmacist [facility].  Someone will be reaching out to get you scheduled for this.  Will work on finding a substitute for Januvia since this will no longer be covered.  Continue to monitor mental status, notify us of any changes.  Follow up with Dr Okey Dupre as scheduled.

## 2024-01-25 NOTE — Progress Notes (Signed)
 Established Patient Office Visit  Subjective   [name], female    DOB: September 24, 1946  Age: 78 y.o. MRN: [id number]  Chief Complaint  Patient presents with   Hospitalization Follow-up    Had a fall but doing better, swelling has gone down, no headaches    HPI Patient presents today for follow up after she tri[facility] and fell [facility]. She is accompanied by her son who is acting as historian. Had a witnessed mechanical fall, caught herself with both wrists, did hit her head on the floor.  Denies LOC, nausea, vomiting, lethargy, headaches, vision changes. She did go to the emergency department and had a negative head CT, x-rays that were negative for any fracture or dislocations. Son states that she is having some increased agitation (since before the fall).  States that she does take Tylenol PM at night to help with sleep. States this seems to help. Requesting replacement for Januvia, states insurance will no longer pay for this. Denies other concerns. Medical history as outlined below.  ROS Per HPI    Objective:     BP (!) 142/84 (BP Location: Left Arm, Patient Position: Sitting)   Pulse (!) 50   Temp 97.7 F (36.5 C) (Temporal)   Ht 5\' 3"  (1.6 m)   Wt 173 lb (78.5 kg)   LMP 03/19/1986   SpO2 98%   BMI 30.65 kg/m   Physical Exam Vitals and nursing note reviewed.  Constitutional:      General: She is not in acute distress.    Appearance: Normal appearance. She is normal weight.     Comments: Elderly  HENT:     Head: Normocephalic. Contusion present.      Comments: Area of bruising to the face, no bleeding, no discharge    Right Ear: External ear normal.     Left Ear: External ear normal.     Nose: Nose normal.  Eyes:     Extraocular Movements: Extraocular movements intact.     Pupils: Pupils are equal, round, and reactive to light.  Cardiovascular:     Rate and Rhythm: Normal rate and regular rhythm.     Pulses: Normal pulses.     Heart  sounds: Normal heart sounds.  Pulmonary:     Effort: Pulmonary effort is normal. No respiratory distress.     Breath sounds: Normal breath sounds. No wheezing, rhonchi or rales.  Musculoskeletal:        General: Normal range of motion.     Cervical back: Normal range of motion.     Right lower leg: No edema.     Left lower leg: No edema.  Lymphadenopathy:     Cervical: No cervical adenopathy.  Neurological:     General: No focal deficit present.     Mental Status: She is alert. Mental status is at baseline.     Cranial Nerves: No cranial nerve deficit.     Sensory: No sensory deficit.     Motor: No weakness.     Coordination: Coordination normal.     Gait: Gait normal.     Comments: Baseline per son  Psychiatric:        Mood and Affect: Mood normal.        Behavior: Behavior normal.      No results found for any visits on 01/25/24.   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    Sherald Barge, FNP

## 2024-01-25 NOTE — Telephone Encounter (Signed)
 Copied from CRM [id number]. Topic: Clinical - Prescription Issue >> Jan 25, 2024  1:24 PM Sonny Dandy B wrote: Reason for CRM: Lesli Albee, RN @ [facility] called to request a prescription for lidocaine (LIDODERM) 5 %  patch. And  a letter stating that medication is able to be apply while the client is in there care during center hours, She is requesting the information requested to be faxed to  [phone],  Call back number [phone]

## 2024-01-26 ENCOUNTER — Telehealth: Payer: Self-pay | Admitting: *Deleted

## 2024-01-26 MED ORDER — LIDOCAINE 5 % EX PTCH: MEDICATED_PATCH | CUTANEOUS | 3 refills | Status: DC

## 2024-01-26 NOTE — Progress Notes (Signed)
 Care Guide Pharmacy Note  01/26/2024 Name: [name] MRN: [id number] DOB: 03-Sep-1946  Referred By: Myrlene Broker, MD Reason for referral: Complex Care Management and Call Attempt #1 (Outreach to schedule referral with pharmacist )   Pamela Morgan is a 78 y.o. year old female who is a primary care patient of Myrlene Broker, MD.  [name] was referred to the pharmacist for assistance related to: DMII  An unsuccessful telephone outreach was attempted today to contact the patient who was referred to the pharmacy team for assistance with medication assistance. Additional attempts will be made to contact the patient.  Burman Nieves, CMA [place]  [facility], [facility] Guide Direct Dial: [phone]  Fax: [phone] Website: [email or web address]

## 2024-01-26 NOTE — Telephone Encounter (Signed)
**Note De-identified  [place] Obfuscation** Please advise 

## 2024-01-26 NOTE — Telephone Encounter (Signed)
Letter has been fax

## 2024-01-26 NOTE — Addendum Note (Signed)
 Addended by: Hillard Danker A on: 01/26/2024 03:32 PM   Modules accepted: Orders

## 2024-01-26 NOTE — Telephone Encounter (Signed)
 Rx refilled for lidoderm 5%, okay to do letter

## 2024-01-26 NOTE — Progress Notes (Signed)
 Care Guide Pharmacy Note  01/26/2024 Name: Pamela Morgan MRN: [id number] DOB: 03-08-1946  Referred By: Myrlene Broker, MD Reason for referral: Complex Care Management and Call Attempt #1 (Outreach to schedule referral with pharmacist )   Pamela Morgan is a 78 y.o. year old female who is a primary care patient of Myrlene Broker, MD.  [name] was referred to the pharmacist for assistance related to: DMII  Successful contact was made with the patient to discuss pharmacy services including being ready for the pharmacist to call at least 5 minutes before the scheduled appointment time and to have medication bottles and any blood pressure readings ready for review. The patient agreed to meet with the pharmacist via telephone visit on 02/09/2024  Burman Nieves, CMA [place]  [facility], [facility] Guide Direct Dial: [phone]  Fax: [phone] Website: [email or web address]

## 2024-01-27 ENCOUNTER — Telehealth: Payer: Self-pay | Admitting: Internal Medicine

## 2024-01-27 NOTE — Telephone Encounter (Signed)
 Copied from CRM [id number]. Topic: Clinical - Medication Question >> Jan 27, 2024  8:25 AM Nyra Capes wrote: Reason for CRM: patient Pamela Morgan daughter called in asking for a  form to be filled out for the   [facility] phone # [phone]  needs permission to administer this lidocaine (LIDODERM) 5 %  and Tylenol to the patient.  [name] unable to find fax#    [name] would like [place] to call the facility asking for fax # only

## 2024-01-28 NOTE — Telephone Encounter (Signed)
 This was fax over to them already

## 2024-01-29 NOTE — Telephone Encounter (Signed)
 Copied from CRM [id number]. Topic: Clinical - Medication Question >> Jan 29, 2024 11:40 AM Arley Phenix D wrote: Reason for CRM: Angelique Blonder with [facility] needs the dose to administer this lidocaine (LIDODERM) 5 %  and Tylenol to the patient. Angelique Blonder stated that they received permission to administer the medication but the dose wasn't listed. Angelique Blonder stated that she has faxed over the documents to be filled out and faxed back to [phone].

## 2024-02-01 NOTE — Telephone Encounter (Signed)
 Lidocaine 5% is 1 patch daily change every 24 hours. Tylenol is 500 mg every 4 hours prn pain up to max 3000 mg in 24 hours.

## 2024-02-01 NOTE — Telephone Encounter (Signed)
 Lidocaine is a patch does this need a dosage? Same as in tylenol? Please advise

## 2024-02-02 ENCOUNTER — Telehealth: Payer: Self-pay | Admitting: Internal Medicine

## 2024-02-02 NOTE — Telephone Encounter (Signed)
 I would have no way of knowing what medicine she took earlier in life. She is currently taking remeron (mirtazapine) for mood and we could change the dose of this if it is not working currently.

## 2024-02-02 NOTE — Telephone Encounter (Signed)
 Copied from CRM [id number]. Topic: General - Other >> Feb 02, 2024  1:39 PM Chuck Crater wrote: Reason for CRM: Years ago patient was on an anti depressants when patient daughter was a child. Patient daughter wants to know what the name of the anti depressant she was on. She states patient mood is starting to be the same and she wants to know what that medication was. She states that mom can't control mood now(vascular dementia). Patient daughter is requesting a callback.

## 2024-02-03 ENCOUNTER — Other Ambulatory Visit: Payer: Self-pay | Admitting: Internal Medicine

## 2024-02-03 ENCOUNTER — Telehealth: Payer: Self-pay | Admitting: Internal Medicine

## 2024-02-03 NOTE — Telephone Encounter (Signed)
 Please see note below: requesting alternate script   Copied from CRM [id number]. Topic: Clinical - Prescription Issue >> Feb 03, 2024  2:26 PM Freya Jesus wrote: Reason for CRM: Patient daughter called and said that [facility] no longer offer sitaGLIPtin (JANUVIA) 100 MG tablet [[id number]] but will accept the generic version: Zitquvio

## 2024-02-03 NOTE — Telephone Encounter (Signed)
 Copied from CRM [id number]. Topic: Clinical - Medication Refill >> Feb 03, 2024  2:21 PM Freya Jesus wrote: Most Recent Primary Care Visit:  Provider: [facility]  Department: [facility]  Visit Type: ACUTE  Date: 01/25/2024  Medication: lidocaine (LIDODERM) 5 % [[id number]]  Has the patient contacted their pharmacy? No (Agent: If no, request that the patient contact the pharmacy for the refill. If patient does not wish to contact the pharmacy document the reason why and proceed with request.) (Agent: If yes, when and what did the pharmacy advise?) sent to wrong pharmacy  Is this the correct pharmacy for this prescription? Yes If no, delete pharmacy and type the correct one.  This is the patient's preferred pharmacy:   [facility] - [place], [place] - [address] [facility] [address] [place] [address] [place] [address] Phone: [phone] Fax: [phone]    Has the prescription been filled recently? Yes  Is the patient out of the medication? Yes  Has the patient been seen for an appointment in the last year OR does the patient have an upcoming appointment? Yes  Can we respond through MyChart? No  Agent: Please be advised that Rx refills may take up to 3 business days. We ask that you follow-up with your pharmacy.

## 2024-02-04 NOTE — Telephone Encounter (Signed)
 Lidocaine was sent on 01/26/24 to [facility], patient requested [facility]. Pended medication with requested pharmacy. Please resend.

## 2024-02-08 MED ORDER — LIDOCAINE 5 % EX PTCH: MEDICATED_PATCH | CUTANEOUS | 3 refills | Status: AC

## 2024-02-08 NOTE — Telephone Encounter (Signed)
 I have was able to fax over the instruction for this patient

## 2024-02-09 ENCOUNTER — Other Ambulatory Visit (INDEPENDENT_AMBULATORY_CARE_PROVIDER_SITE_OTHER): Admitting: Pharmacist

## 2024-02-09 DIAGNOSIS — E118 Type 2 diabetes mellitus with unspecified complications: Secondary | ICD-10-CM

## 2024-02-09 MED ORDER — SITAGLIPTIN 100 MG PO TABS: 100.0000 mg | ORAL_TABLET | Freq: Every day | ORAL | 3 refills | Status: AC

## 2024-02-09 MED ORDER — GLIPIZIDE ER 5 MG PO TB24: 5.0000 mg | ORAL_TABLET | Freq: Every day | ORAL | 3 refills | Status: AC

## 2024-02-09 NOTE — Patient Instructions (Signed)
 It was a pleasure speaking with you today!  I have sent glipizide  and Zituvio  to [facility] pharmacy.  Feel free to call with any questions or concerns!  Rainelle Bur, PharmD, BCPS, CPP Clinical Pharmacist Practitioner [facility] [phone]

## 2024-02-09 NOTE — Progress Notes (Signed)
 02/09/2024 Name: Pamela Morgan MRN: [id number] DOB: 11/20/1945  Chief Complaint  Patient presents with   Medication access    Pamela Morgan is a 78 y.o. year old female who presented for a telephone visit. Spoke to pt's daughter, Pamela Morgan.   They were referred to the pharmacist by their PCP for assistance in managing medication access.   Subjective:  Care Team: Primary Care Provider: Adelia Homestead, MD ; Next Scheduled Visit: 12/15/24   Medication Access/Adherence  Current Pharmacy:  [facility] - [place], [place] - [address] [facility] [address] [place] [address] [place] [address] Phone: [phone] Fax: [phone]  [id number] [place], [place] - [address] AT [facility] [address] [place] [address] Phone: [phone] Fax: [phone]  [facility] - [place], [place] - [address] [place] [phone] Phone: [phone] Fax: [phone]   Patient reports affordability concerns with their medications: No  Patient reports access/transportation concerns to their pharmacy: No  Patient reports adherence concerns with their medications:  Yes    Pt is currently out of glipizide  due to not having refills at [facility] pharmacy. Pt is out of Januvia  due to [facility] no longer covering Januvia  but they will cover Zituvio  as an alternative   [name] notes she is not taking mirtazapine  due to concern for side effects, specifically risk of slowing the heart rate. She notes her mother is very sensitive to medications and has had issues before with the heart rate. She notes she used to be on a medication for mood a long time ago and wonders what that was.   Objective:  Lab Results  Component Value Date   HGBA1C 9.9 (H) 11/04/2023    Lab Results  Component Value Date   CREATININE 0.90 11/04/2023   BUN 12 11/04/2023   NA 145 11/04/2023   K 3.7 11/04/2023   CL 102 11/04/2023    CO2 34 (H) 11/04/2023    Lab Results  Component Value Date   CHOL 180 03/25/2023   HDL 55.50 03/25/2023   LDLCALC 106 (H) 03/25/2023   LDLDIRECT 138.9 02/22/2013   TRIG 93.0 03/25/2023   CHOLHDL 3 03/25/2023    Medications Reviewed Today     Reviewed by Dion Frankel, RPH (Pharmacist) on 02/09/24 at 1532  Med List Status: <None>   Medication Order Taking? Sig Documenting Provider Last Dose Status Informant  acyclovir  ointment (ZOVIRAX ) 5 % [id number]  Apply 1 application topically every 3 (three) hours. Adelia Homestead, MD  Active   albuterol  (PROVENTIL ) (2.5 MG/3ML) 0.083% nebulizer solution [id number]  Take 3 mLs (2.5 mg total) by nebulization every 6 (six) hours as needed for wheezing Morgan shortness of breath. Adra Alanis, FNP  Active   albuterol  (VENTOLIN  HFA) 108 [phone] Base) MCG/ACT inhaler [id number]  Inhale 1-2 puffs into the lungs every 6 (six) hours as needed for wheezing Morgan shortness of breath. Adelia Homestead, MD  Active   aspirin  81 MG chewable tablet [id number] Yes Chew 81 mg by mouth daily. [provider] Taking Active Self  Blood Glucose Monitoring Suppl (ONE TOUCH ULTRA MINI) w/Device KIT [id number]  Use to check sugar Adelia Homestead, MD  Active   docusate sodium  (COLACE) 100 MG capsule [id number]  Take 1 capsule (100 mg total) by mouth daily. Adelia Homestead, MD  Active   fexofenadine  (ALLEGRA ) 180 MG tablet [id number]  Take 1 tablet (  180 mg total) by mouth daily. Adelia Homestead, MD  Active   fluticasone  (FLONASE ) 50 MCG/ACT nasal spray [id number]  INSTILL 2 SPRAYS IN EACH NOSTRIL EVERY DAY Adelia Homestead, MD  Active   fluticasone  furoate-vilanterol (BREO ELLIPTA ) 200-25 MCG/ACT AEPB [id number]  Inhale 1 puff into the lungs daily. Adelia Homestead, MD  Active   glipiZIDE  (GLIPIZIDE  XL) 5 MG 24 hr tablet [id number] No Take 1 tablet (5 mg total) by mouth daily with breakfast.  Patient not taking: Reported on  02/09/2024   Adelia Homestead, MD Not Taking Active   glucose blood (ONE TOUCH ULTRA TEST) test strip [id number]  1 each by Other route 2 (two) times daily. Use to check blood sugars twice a day Dx E11.9 Adelia Homestead, MD  Active   Lancets [facility] ULTRASOFT) lancets [id number]  Use as instructed Adelia Homestead, MD  Active   lidocaine  (LIDODERM ) 5 % [id number]  APPLY 1 PATCH TO SKIN EVERY DAY (12 HOURS ON - 12 HOURS OFF) Adelia Homestead, MD  Active   losartan -hydrochlorothiazide  (HYZAAR) 100-12.5 MG tablet [id number] Yes Take 1 tablet by mouth daily. Adelia Homestead, MD Taking Active   mirtazapine  (REMERON ) 15 MG tablet [id number]  Take 1 tablet (15 mg total) by mouth at bedtime. Adelia Homestead, MD  Active   olopatadine  (PATANOL) 0.1 % ophthalmic solution [id number]  Place 1 drop into both eyes 2 (two) times daily as needed for allergies. Adelia Homestead, MD  Active   SITagliptin  (ZITUVIO ) 100 MG TABS [id number] No Take 100 mg by mouth daily.  Patient not taking: Reported on 02/09/2024   Adelia Homestead, MD Not Taking Active               Assessment/Plan:   Med access: - Sent glipizide  and Zituvio  100 mg daily (to replace Januvia , therapeutic equivalent) to [facility] - Was able to see in chart that pt was on Prozac  10 mg daily from at least 2009-2015, gave [name] that information  Follow Up Plan: PRN  Rainelle Bur, PharmD, BCPS, CPP Clinical Pharmacist Practitioner [facility] [phone]

## 2024-02-10 NOTE — Telephone Encounter (Signed)
 Due for follow up HgA1c please schedule and can address at visit

## 2024-02-11 NOTE — Telephone Encounter (Signed)
 Copied from CRM [id number]. Topic: Clinical - Prescription Issue >> Feb 11, 2024 10:29 AM Smiley Dung G wrote: Ruffin Cotton, RN @ [facility] called back.. still waiting on form filled out for the medication attached.  Needs to include detail instructions.. Pls call her back [phone].. fax [phone]

## 2024-02-11 NOTE — Telephone Encounter (Signed)
 This has been signed and fax back over

## 2024-02-22 ENCOUNTER — Telehealth: Payer: Self-pay

## 2024-02-22 NOTE — Telephone Encounter (Signed)
 Patient was identified as falling into the [place] Measure - Diabetes.   Patient was: Appointment already scheduled for:  05/06.

## 2024-02-23 ENCOUNTER — Encounter: Payer: Self-pay | Admitting: Internal Medicine

## 2024-02-23 ENCOUNTER — Ambulatory Visit (INDEPENDENT_AMBULATORY_CARE_PROVIDER_SITE_OTHER): Admitting: Internal Medicine

## 2024-02-23 VITALS — BP 148/80 | HR 50 | Temp 98.1°F | Ht 63.0 in | Wt 172.0 lb

## 2024-02-23 DIAGNOSIS — F02B18 Dementia in other diseases classified elsewhere, moderate, with other behavioral disturbance: Secondary | ICD-10-CM | POA: Diagnosis not present

## 2024-02-23 DIAGNOSIS — I10 Essential (primary) hypertension: Secondary | ICD-10-CM

## 2024-02-23 DIAGNOSIS — E118 Type 2 diabetes mellitus with unspecified complications: Secondary | ICD-10-CM | POA: Diagnosis not present

## 2024-02-23 DIAGNOSIS — G300 Alzheimer's disease with early onset: Secondary | ICD-10-CM | POA: Diagnosis not present

## 2024-02-23 DIAGNOSIS — J4530 Mild persistent asthma, uncomplicated: Secondary | ICD-10-CM

## 2024-02-23 LAB — POCT GLYCOSYLATED HEMOGLOBIN (HGB A1C): HbA1c POC (<> result, manual entry): 9.4 % (ref 4.0–5.6)

## 2024-02-23 MED ORDER — FLUOXETINE HCL 20 MG PO TABS: 20.0000 mg | ORAL_TABLET | Freq: Every day | ORAL | 1 refills | Status: AC

## 2024-02-23 NOTE — Assessment & Plan Note (Signed)
 BP mildly elevated today but will leave regimen same due to not taken medicine today and it typically is at goal. They check it at her daytime program. Continue losartan /hydrochlorothiazide .

## 2024-02-23 NOTE — Assessment & Plan Note (Signed)
 No flare today and has done well during allergy season.

## 2024-02-23 NOTE — Patient Instructions (Addendum)
 The sugars are improving slightly and 9.4 today HgA1c.   We have sent in the prozac  to help the mood.   We will get you in with the podiatrist to help trim the nails.

## 2024-02-23 NOTE — Assessment & Plan Note (Addendum)
 POC HGA1c done and improved but still not at ideal goal of 8%. Given her advancing dementia this goal may not be acheiveable. She had been out of sitagliptin  and is now taking again. Also was not taking glipizide . Will resume normal regimen and reassess in 3 months. Low sugars are a risk for her given inconsistent appetite. Referral to podiatry due to thick nails

## 2024-02-23 NOTE — Progress Notes (Signed)
   Subjective:   [name], female    DOB: 1946-06-09, 78 y.o.   MRN: [id number]  HPI The patient is a 78 YO female coming in for follow up medical conditions (see A/P).   Review of Systems  Unable to perform ROS: Dementia  Psychiatric/Behavioral:  Positive for behavioral problems.     Objective:  Physical Exam Constitutional:      Appearance: She is well-developed.  HENT:     Head: Normocephalic and atraumatic.  Cardiovascular:     Rate and Rhythm: Normal rate and regular rhythm.  Pulmonary:     Effort: Pulmonary effort is normal. No respiratory distress.     Breath sounds: Normal breath sounds. No wheezing or rales.  Abdominal:     General: Bowel sounds are normal. There is no distension.     Palpations: Abdomen is soft.     Tenderness: There is no abdominal tenderness. There is no rebound.  Musculoskeletal:     Cervical back: Normal range of motion.  Skin:    General: Skin is warm and dry.  Neurological:     Mental Status: She is alert. Mental status is at baseline.     Coordination: Coordination normal.     Vitals:   02/23/24 0837 02/23/24 0848  BP: (!) 148/80 (!) 148/80  Pulse: (!) 50   Temp: 98.1 F (36.7 C)   TempSrc: Oral   SpO2: 95%   Weight: 172 lb (78 kg)   Height: 5\' 3"  (1.6 m)     Assessment & Plan:

## 2024-02-23 NOTE — Assessment & Plan Note (Signed)
 Overall progressive and not taking remeron  due to it not working. She did take prozac  earlier in life for mood with good results. Rx prozac  20 mg daily and follow up on efficacy.

## 2024-03-01 ENCOUNTER — Ambulatory Visit (INDEPENDENT_AMBULATORY_CARE_PROVIDER_SITE_OTHER): Admitting: Podiatry

## 2024-03-01 ENCOUNTER — Encounter: Payer: Self-pay | Admitting: Podiatry

## 2024-03-01 DIAGNOSIS — E118 Type 2 diabetes mellitus with unspecified complications: Secondary | ICD-10-CM | POA: Diagnosis not present

## 2024-03-01 DIAGNOSIS — B351 Tinea unguium: Secondary | ICD-10-CM

## 2024-03-01 DIAGNOSIS — M79676 Pain in unspecified toe(s): Secondary | ICD-10-CM | POA: Diagnosis not present
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
## 2024-03-22 ENCOUNTER — Other Ambulatory Visit: Payer: Self-pay | Admitting: Internal Medicine

## 2024-03-22 MED ORDER — LOSARTAN POTASSIUM-HCTZ 100-12.5 MG PO TABS
1.0000 | ORAL_TABLET | Freq: Every day | ORAL | 1 refills | Status: DC
Start: 2024-03-22 — End: 2024-08-30

## 2024-03-22 NOTE — Telephone Encounter (Signed)
 Copied from CRM [id number]. Topic: Clinical - Medication Refill >> Mar 22, 2024  9:37 AM Pamela Morgan wrote: Medication: losartan -hydrochlorothiazide  (HYZAAR) 100-12.5 MG tablet  Has the patient contacted their pharmacy? Yes (Agent: If no, request that the patient contact the pharmacy for the refill. If patient does not wish to contact the pharmacy document the reason why and proceed with request.) (Agent: If yes, when and what did the pharmacy advise?) Out of refills  This is the patient's preferred pharmacy:  [facility] - [place], [place] - [address] [facility] [address] [place] [address] [place] [address] Phone: [phone] Fax: [phone]    Is this the correct pharmacy for this prescription? Yes If no, delete pharmacy and type the correct one.   Has the prescription been filled recently? No  Is the patient out of the medication? No  Has the patient been seen for an appointment in the last year OR does the patient have an upcoming appointment? Yes  Can we respond through MyChart? Yes  Agent: Please be advised that Rx refills may take up to 3 business days. We ask that you follow-up with your pharmacy.

## 2024-03-22 NOTE — Telephone Encounter (Signed)
 Last filled 09/03/2023 Last office visit 02/23/2024

## 2024-06-07 ENCOUNTER — Encounter: Payer: Self-pay | Admitting: Internal Medicine

## 2024-06-07 ENCOUNTER — Ambulatory Visit: Payer: Self-pay

## 2024-06-07 ENCOUNTER — Ambulatory Visit (INDEPENDENT_AMBULATORY_CARE_PROVIDER_SITE_OTHER): Admitting: Internal Medicine

## 2024-06-07 VITALS — BP 205/74 | HR 51 | Temp 98.5°F | Ht 63.0 in | Wt 177.0 lb

## 2024-06-07 DIAGNOSIS — F02B18 Dementia in other diseases classified elsewhere, moderate, with other behavioral disturbance: Secondary | ICD-10-CM

## 2024-06-07 DIAGNOSIS — K5900 Constipation, unspecified: Secondary | ICD-10-CM | POA: Diagnosis not present

## 2024-06-07 DIAGNOSIS — M545 Low back pain, unspecified: Secondary | ICD-10-CM

## 2024-06-07 DIAGNOSIS — G300 Alzheimer's disease with early onset: Secondary | ICD-10-CM

## 2024-06-07 NOTE — Assessment & Plan Note (Signed)
 Chronic dementia. History is obtained from Marcey and Annabella -her children

## 2024-06-07 NOTE — Assessment & Plan Note (Addendum)
 Chronic constipation.  No signs of fecal impaction, infection, inflamed hemorrhoid.  Pain seems to be related to her chronic low back pain.   Use Miralax  2-3 scoops in water as needed Glycerin suppository as needed Use Dulcolax 1-2 a day Anoscopy -see procedure I offered labs, x-rays -no need to do those at present.

## 2024-06-07 NOTE — Assessment & Plan Note (Signed)
 Chronic low back pain discussed with the family.  Will get lidocaine  patches for the patient.  Tylenol  as needed.  Warm heating pad  Follow-up with Dr. Rollene

## 2024-06-07 NOTE — Telephone Encounter (Signed)
 FYI Only or Action Required?: FYI only for provider.  Patient was last seen in primary care on 02/23/2024 by Rollene Almarie LABOR, MD.  Called Nurse Triage reporting Rectal Pain.  Symptoms began several days ago.  Interventions attempted: OTC medications: hemorrhoid cream and Ice/heat application.  Symptoms are: gradually worsening.  Triage Disposition: See Physician Within 24 Hours  Patient/caregiver understands and will follow disposition?: Yes                             Copied from CRM #[id number]. Topic: Clinical - Red Word Triage >> Jun 07, 2024  8:55 AM Turkey A wrote: [facility] that prompted transfer to Nurse Triage: Patient's daughter Annabella called and says patient is having pain in her bottom. Patient has history of Hemorid. Reason for Disposition  MODERATE-SEVERE rectal pain (i.e., interferes with school, work, or sleep)  Answer Assessment - Initial Assessment Questions 1. SYMPTOM:  What's the main symptom you're concerned about? (e.g., pain, itching, swelling, rash)     Rectal pain 2. ONSET: When did the pain start?     Thursday 3. RECTAL PAIN: Do you have any pain around your rectum? How bad is the pain?  (Scale 0-10; or none, mild, moderate, severe)     Daughter states pain is worsening 4. RECTAL ITCHING: Do you have any itching in this area? How bad is the itching?  (Scale 0-10; or none, mild, moderate, severe)     Daughter states patient has complained of itching 5. CONSTIPATION: Do you have constipation? If Yes, ask: How often do you have a bowel movement (BM)?  (Normal range: 3 times a day to every 3 days)  When was your last BM?       Daughter reports normal BM on Saturday, estimates BM last night  6. CAUSE: What do you think is causing the anus symptoms?     History of hemorrhoids, history of impaction  7. OTHER SYMPTOMS: Do you have any other symptoms?  (e.g., abdomen pain, fever, rectal bleeding, vomiting)      Straining, denies rectal bleeding, denies abdominal pain, denies vomiting, denies fever    Spoke to patient's daughter, Annabella (on [place]). Patient has history of dementia.  Protocols used: Rectal Symptoms-A-AH

## 2024-06-07 NOTE — Patient Instructions (Signed)
 Miralax  2-3 scoops in water as needed Glycerin suppository as needed Use Dulcolax 1-2 a day

## 2024-06-07 NOTE — Progress Notes (Signed)
 Subjective:  [name], female    DOB: 1946/04/06  Age: 78 y.o. MRN: [id number]  CC: Medical Management of Chronic Issues (Rectal Pian and straining x3-4 days... Pt had a small bowel movement but is having some abdominal pain and unable to make a complete bowel movement.SABRA Hx of bowel impaction and issues with hemorrhoids... Pt has taken Docusate and Miralax  which help some but nt completely )   HPI Pamela Morgan presents for chronic constipation C/o pain and straining -the location of the pain is hard to characterize Last BM 3 d ago which looked normal.  Pamela gave her 1 packet of MiraLAX  on Sunday.  No blood in the stool On Dulcolax 1 tablet daily Here w/Steve - son and dtr [name] on the speaker.  The family wants to make sure that the patient does not have hemorrhoids or another episode of stool impaction History is obtained from [place]  Outpatient Medications Prior to Visit  Medication Sig Dispense Refill   acyclovir  ointment (ZOVIRAX ) 5 % Apply 1 application topically every 3 (three) hours. 30 g 0   albuterol  (VENTOLIN  HFA) 108 (90 Base) MCG/ACT inhaler Inhale 1-2 puffs into the lungs every 6 (six) hours as needed for wheezing or shortness of breath. 3 each 1   aspirin  81 MG chewable tablet Chew 81 mg by mouth daily.     Blood Glucose Monitoring Suppl (ONE TOUCH ULTRA MINI) w/Device KIT Use to check sugar 1 each 0   docusate sodium  (COLACE) 100 MG capsule Take 1 capsule (100 mg total) by mouth daily. 90 capsule 0   fexofenadine  (ALLEGRA ) 180 MG tablet Take 1 tablet (180 mg total) by mouth daily. 90 tablet 1   FLUoxetine  (PROZAC ) 20 MG tablet Take 1 tablet (20 mg total) by mouth daily. 90 tablet 1   fluticasone  (FLONASE ) 50 MCG/ACT nasal spray INSTILL 2 SPRAYS IN EACH NOSTRIL EVERY DAY 48 g 3   fluticasone  furoate-vilanterol (BREO ELLIPTA ) 200-25 MCG/ACT AEPB Inhale 1 puff into the lungs daily. 180 each 1   glipiZIDE  (GLIPIZIDE  XL) 5 MG 24 hr tablet Take 1  tablet (5 mg total) by mouth daily with breakfast. 90 tablet 3   glucose blood (ONE TOUCH ULTRA TEST) test strip 1 each by Other route 2 (two) times daily. Use to check blood sugars twice a day Dx E11.9 100 each 3   Lancets (ONETOUCH ULTRASOFT) lancets Use as instructed 100 each 2   lidocaine  (LIDODERM ) 5 % APPLY 1 PATCH TO SKIN EVERY DAY (12 HOURS ON - 12 HOURS OFF) 30 patch 3   losartan -hydrochlorothiazide  (HYZAAR) 100-12.5 MG tablet Take 1 tablet by mouth daily. 90 tablet 1   olopatadine  (PATANOL) 0.1 % ophthalmic solution Place 1 drop into both eyes 2 (two) times daily as needed for allergies. 15 mL 0   SITagliptin  (ZITUVIO ) 100 MG TABS Take 100 mg by mouth daily. 90 tablet 3   albuterol  (PROVENTIL ) (2.5 MG/3ML) 0.083% nebulizer solution Take 3 mLs (2.5 mg total) by nebulization every 6 (six) hours as needed for wheezing or shortness of breath. 150 mL 1   No facility-administered medications prior to visit.    ROS: Review of Systems  Constitutional:  Negative for fever.  Respiratory:  Negative for wheezing.   Gastrointestinal:  Positive for constipation and rectal pain. Negative for abdominal distention, abdominal pain, anal bleeding, blood in stool, diarrhea, nausea and vomiting.  Genitourinary:  Negative for urgency.  Musculoskeletal:  Positive for back pain.  Hematological:  Does not bruise/bleed easily.  Psychiatric/Behavioral:  Positive for confusion.     Objective:  BP (!) 205/74   Pulse (!) 51   Temp 98.5 F (36.9 C) (Oral)   Ht 5' 3 (1.6 m)   Wt 177 lb (80.3 kg)   LMP 03/19/1986   SpO2 99%   BMI 31.35 kg/m   BP Readings from Last 3 Encounters:  06/07/24 (!) 205/74  02/23/24 (!) 148/80  01/25/24 (!) 142/84    Wt Readings from Last 3 Encounters:  06/07/24 177 lb (80.3 kg)  02/23/24 172 lb (78 kg)  01/25/24 173 lb (78.5 kg)    Physical Exam Constitutional:      General: She is not in acute distress.    Appearance: She is well-developed. She is obese.   HENT:     Head: Normocephalic.     Right Ear: External ear normal.     Left Ear: External ear normal.     Nose: Nose normal.  Eyes:     General:        Right eye: No discharge.        Left eye: No discharge.     Conjunctiva/sclera: Conjunctivae normal.     Pupils: Pupils are equal, round, and reactive to light.  Neck:     Thyroid : No thyromegaly.     Vascular: No JVD.     Trachea: No tracheal deviation.  Cardiovascular:     Rate and Rhythm: Normal rate and regular rhythm.     Heart sounds: Normal heart sounds.  Pulmonary:     Effort: No respiratory distress.     Breath sounds: No stridor. No wheezing.  Abdominal:     General: Bowel sounds are normal. There is no distension.     Palpations: Abdomen is soft. There is no mass.     Tenderness: There is no abdominal tenderness. There is no guarding or rebound.     Hernia: No hernia is present.  Genitourinary:    Rectum: Normal. Guaiac result negative.  Musculoskeletal:        General: No tenderness.     Cervical back: Normal range of motion and neck supple. No rigidity.  Lymphadenopathy:     Cervical: No cervical adenopathy.  Skin:    Findings: No erythema or rash.  Neurological:     Mental Status: She is disoriented.     Cranial Nerves: No cranial nerve deficit.     Motor: No abnormal muscle tone.     Coordination: Coordination abnormal.     Gait: Gait abnormal.     Deep Tendon Reflexes: Reflexes normal.  Psychiatric:        Behavior: Behavior normal.        Thought Content: Thought content normal.        Judgment: Judgment normal.   Her gait is antalgic.  She is confused.  Alert, cooperative Abdomen soft non-tender The patient has a lot of low back pain with range of motion and palpation.  She was very uncomfortable moving on the exam table.  We were not able to lay her on the side due to pain in the back.  Rectal examination was done in the standing position reveals external tags, normal anus tone empty rectum.  No  masses.  I was able to reach somewhat hard stool with the tip of my finger up above.  Stool was guaiac negative  Procedure: Anoscopy  Indication: rectal symptoms, constipation  Risks and benefits were explained to the  patient in detail. He was placed in lateral decubitus position. Anus exam revealed skin tags digital rectal exam was normal, it was  not painful. Stool was guaiac negative. Anoscope was introduced without difficulty. Upon withdrawal erythematous mucosa was observed.  No masses found.  No internal hemorrhoids.   Impression: Normal endoscopy. No fissure or hemorrhoids.  Tolerated well. Complications - none.   Lab Results  Component Value Date   WBC 9.2 11/04/2023   HGB 13.4 11/04/2023   HCT 41.1 11/04/2023   PLT 205.0 11/04/2023   GLUCOSE 147 (H) 11/04/2023   CHOL 180 03/25/2023   TRIG 93.0 03/25/2023   HDL 55.50 03/25/2023   LDLDIRECT 138.9 02/22/2013   LDLCALC 106 (H) 03/25/2023   ALT 17 11/04/2023   AST 16 11/04/2023   NA 145 11/04/2023   K 3.7 11/04/2023   CL 102 11/04/2023   CREATININE 0.90 11/04/2023   BUN 12 11/04/2023   CO2 34 (H) 11/04/2023   TSH 1.26 10/29/2017   INR 1.05 08/07/2015   HGBA1C 9.4 02/23/2024    CT Cervical Spine Wo Contrast Result Date: 01/19/2024 CLINICAL DATA:  Neck trauma (Age >= 65y).  Fall EXAM: CT CERVICAL SPINE WITHOUT CONTRAST TECHNIQUE: Multidetector CT imaging of the cervical spine was performed without intravenous contrast. Multiplanar CT image reconstructions were also generated. RADIATION DOSE REDUCTION: This exam was performed according to the departmental dose-optimization program which includes automated exposure control, adjustment of the mA and/or kV according to patient size and/or use of iterative reconstruction technique. COMPARISON:  10/14/2019 FINDINGS: Alignment: Normal alignment. Skull base and vertebrae: No acute fracture. No primary bone lesion or focal pathologic process. Soft tissues and spinal canal: No  prevertebral fluid or swelling. No visible canal hematoma. Disc levels: Diffuse degenerative disc disease with disc space narrowing and spurring. Moderate bilateral degenerative facet disease diffusely. Multilevel bilateral neural foraminal narrowing. Upper chest: No acute findings. Other: None IMPRESSION: Degenerative disc and facet disease.  No acute bony abnormality. Electronically Signed   By: Franky Crease M.D.   On: 01/19/2024 19:33   CT Head Wo Contrast Result Date: 01/19/2024 CLINICAL DATA:  Head trauma, minor (Age >= 65y). Fall. Hit forehead. EXAM: CT HEAD WITHOUT CONTRAST TECHNIQUE: Contiguous axial images were obtained from the base of the skull through the vertex without intravenous contrast. RADIATION DOSE REDUCTION: This exam was performed according to the departmental dose-optimization program which includes automated exposure control, adjustment of the mA and/or kV according to patient size and/or use of iterative reconstruction technique. COMPARISON:  01/30/2021 FINDINGS: Brain: There is atrophy and chronic small vessel disease changes. No acute intracranial abnormality. Specifically, no hemorrhage, hydrocephalus, mass lesion, acute infarction, or significant intracranial injury. Vascular: No hyperdense vessel or unexpected calcification. Skull: No acute calvarial abnormality. Sinuses/Orbits: No acute findings Other: Soft tissue swelling in the forehead region. IMPRESSION: Atrophy, chronic microvascular disease. No acute intracranial abnormality. Electronically Signed   By: Franky Crease M.D.   On: 01/19/2024 19:32   DG Wrist Complete Left Result Date: 01/19/2024 CLINICAL DATA:  Fall and trauma to the left wrist.  Pain. EXAM: LEFT WRIST - COMPLETE 3+ VIEW COMPARISON:  None Available. FINDINGS: No acute fracture or dislocation. Mild osteopenia. No significant arthritic change. The soft tissue swelling of the dorsum of the distal forearm. No radiopaque foreign object or soft tissue gas. IMPRESSION:  1. No acute fracture or dislocation. 2. Soft tissue swelling. Electronically Signed   By: Vanetta Chou M.D.   On: 01/19/2024 19:24    Assessment &  Plan:   Problem List Items Addressed This Visit     Acute bilateral low back pain without sciatica   Chronic low back pain discussed with the family.  Will get lidocaine  patches for the patient.  Tylenol  as needed.  Warm heating pad  Follow-up with Dr. Rollene      Constipation - Primary   Chronic constipation.  No signs of fecal impaction, infection, inflamed hemorrhoid.  Pain seems to be related to her chronic low back pain.   Use Miralax  2-3 scoops in water as needed Glycerin suppository as needed Use Dulcolax 1-2 a day Anoscopy -see procedure I offered labs, x-rays -no need to do those at present.       Dementia (HCC)   Chronic dementia. History is obtained from Pamela Morgan -her children         No orders of the defined types were placed in this encounter.     Follow-up: Return in about 4 weeks (around 07/05/2024) for f/u with PCP.  Pamela Noel, MD

## 2024-06-08 ENCOUNTER — Encounter: Payer: Self-pay | Admitting: Podiatry

## 2024-06-08 ENCOUNTER — Ambulatory Visit (INDEPENDENT_AMBULATORY_CARE_PROVIDER_SITE_OTHER): Admitting: Podiatry

## 2024-06-08 DIAGNOSIS — E119 Type 2 diabetes mellitus without complications: Secondary | ICD-10-CM | POA: Diagnosis not present

## 2024-06-08 DIAGNOSIS — M79676 Pain in unspecified toe(s): Secondary | ICD-10-CM | POA: Diagnosis not present

## 2024-06-08 DIAGNOSIS — E118 Type 2 diabetes mellitus with unspecified complications: Secondary | ICD-10-CM | POA: Diagnosis not present

## 2024-06-08 DIAGNOSIS — B351 Tinea unguium: Secondary | ICD-10-CM | POA: Diagnosis not present

## 2024-06-08 DIAGNOSIS — M2012 Hallux valgus (acquired), left foot: Secondary | ICD-10-CM

## 2024-06-08 DIAGNOSIS — M2011 Hallux valgus (acquired), right foot: Secondary | ICD-10-CM | POA: Diagnosis not present

## 2024-06-12 ENCOUNTER — Encounter: Payer: Self-pay | Admitting: Podiatry
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
## 2024-07-22 NOTE — Telephone Encounter (Signed)
 Error

## 2024-08-09 ENCOUNTER — Telehealth: Payer: Self-pay | Admitting: Internal Medicine

## 2024-08-09 NOTE — Telephone Encounter (Signed)
 Paperwork is being dropped off to be filled out and faxed back to its designated area once its completed and pt daughter is aware of the time frame with pickups and would like to be called once this is completed and ready to pick up. Please advise,  Thanks

## 2024-08-10 NOTE — Telephone Encounter (Signed)
 Spoke with daughter aware paperwork has been received. Notified her that the Certification of Disability form was signed incorrectly and we would need a new copy in order for Dr. Rollene to sign. Daughter to drop off another copy at our office.

## 2024-08-15 ENCOUNTER — Telehealth: Payer: Self-pay

## 2024-08-15 NOTE — Telephone Encounter (Signed)
 Copied from CRM [id number]. Topic: Clinical - Medical Advice >> Aug 15, 2024 10:24 AM Nessti S wrote: Reason for CRM: charde called because medical forms were sent for pt to attend day center. She would like forms faxed to [phone]. Call back number [phone]

## 2024-08-17 NOTE — Telephone Encounter (Signed)
 Called and have ask for this form to be fax over again as this has not been received

## 2024-08-17 NOTE — Telephone Encounter (Signed)
 Spoke with Karna @[facility] and faxed form on 10/29@106pm . Spoke with director of the facility @445pm  and confirmed that forms were received.

## 2024-08-29 ENCOUNTER — Other Ambulatory Visit: Payer: Self-pay | Admitting: Internal Medicine

## 2024-09-01 ENCOUNTER — Encounter: Payer: Self-pay | Admitting: Internal Medicine

## 2024-09-03 ENCOUNTER — Encounter: Payer: Self-pay | Admitting: Internal Medicine

## 2024-09-05 MED ORDER — LOSARTAN POTASSIUM-HCTZ 100-12.5 MG PO TABS
1.0000 | ORAL_TABLET | Freq: Every day | ORAL | 3 refills | Status: AC
Start: 2024-09-05 — End: ?

## 2024-09-05 MED ORDER — FLUTICASONE FUROATE-VILANTEROL 200-25 MCG/ACT IN AEPB
1.0000 | INHALATION_SPRAY | Freq: Every day | RESPIRATORY_TRACT | 3 refills | Status: AC
Start: 2024-09-05 — End: ?

## 2024-09-21 ENCOUNTER — Encounter: Payer: Self-pay | Admitting: Podiatry

## 2024-09-21 ENCOUNTER — Ambulatory Visit: Admitting: Podiatry

## 2024-09-21 DIAGNOSIS — E118 Type 2 diabetes mellitus with unspecified complications: Secondary | ICD-10-CM | POA: Diagnosis not present

## 2024-09-21 DIAGNOSIS — M79676 Pain in unspecified toe(s): Secondary | ICD-10-CM

## 2024-09-21 DIAGNOSIS — B351 Tinea unguium: Secondary | ICD-10-CM

## 2024-09-25 NOTE — Progress Notes (Signed)
 Subjective:  [name], female    DOB: 10-10-1946,  MRN: [id number]  Pamela Morgan presents to clinic today for preventative diabetic foot care for painful mycotic toenails x 10 which interfere with daily activities. Pain is relieved with periodic professional debridement. She has h/o dementia. She is accompanied by her son in law on today's visit. Chief Complaint  Patient presents with   [facility]    Rm15 Diabetic foot care/ Dr. Almarie [place] last visit December 24, 2023/ A1c 9.4   New problem(s): None.   PCP is [place] Almarie LABOR, MD.  Allergies  Allergen Reactions   Cinnamon Swelling   Codeine     REACTION: itching   Corn-Containing Products     unknown   Egg Protein-Containing Drug Products Swelling   Ezetimibe-Simvastatin     unknown   Fish Allergy     unknown   Fish Oil     unknown   Hydrochlorothiazide -Triamterene     unknown   Influenza Vaccines Hives   Iodine     unknown   Isradipine     unknown   Lovastatin     unknown   Metformin      REACTION: bloating at high dose   [employer] Agent (Non-Screening)] Swelling   Other Hives, Itching and Swelling    Adhesive tape, Orange dyes/food dye, Hydrocodone causes hallucinations   Peanut-Containing Drug Products     nuts   Sulfadiazine     unknown   Sulfamethoxazole     REACTION: unspecified   Tape    Watermelon Concentrate [Citrullus Vulgaris] Swelling   Clonidine Hydrochloride Rash   Latex Rash   Statins Rash    Review of Systems: Negative except as noted in the HPI.  Objective: No changes noted in today's physical examination. There were no vitals filed for this visit. Pamela Morgan is a pleasant 78 y.o. female in NAD. AAO x 3.  Vascular Examination: Capillary refill time immediate b/l. Palpable pedal pulses. Pedal hair present b/l. Pedal edema absent. No pain with calf compression b/l. Skin temperature gradient WNL b/l. No cyanosis or clubbing b/l. No ischemia or  gangrene noted b/l.   Neurological Examination: Sensation grossly intact b/l with 10 gram monofilament. Vibratory sensation intact b/l.   Dermatological Examination: Pedal skin with normal turgor, texture and tone b/l.  No open wounds. No interdigital macerations.   Toenails 1-5 b/l thick, discolored, elongated with subungual debris and pain on dorsal palpation.   No hyperkeratotic nor porokeratotic lesions.  Musculoskeletal Examination: Muscle strength 5/5 to all lower extremity muscle groups bilaterally. HAV with bunion deformity noted b/l LE.  Radiographs: None  Assessment/Plan: 1. Pain due to onychomycosis of toenail   2. Type 2 diabetes mellitus with complication [facility])   Consent given for treatment. Patient examined. All patient's and/or POA's questions/concerns addressed on today's visit. Mycotic toenails 1-5 b/l debrided in length and girth without incident. Continue foot and shoe inspections daily. Monitor blood glucose per PCP/Endocrinologist's recommendations.Continue soft, supportive shoe gear daily. Report any pedal injuries to medical professional. Call office if there are any quesitons/concerns. -Patient/POA to call should there be question/concern in the interim.   Return in about 3 months (around 12/20/2024).  Pamela Morgan, DPM      [place] LOCATION: [address]  [place], [place] [address]                   Office [phone]   [facility] LOCATION: [address] [place], [place] [address] Office [phone]

## 2024-11-17 ENCOUNTER — Telehealth: Payer: Self-pay

## 2024-11-17 NOTE — Telephone Encounter (Signed)
 Copied from CRM #[id number]. Topic: General - Other >> Nov 17, 2024  1:41 PM Drema MATSU wrote: Reason for CRM: [facility] wanted to let clinic know that pt is apart of their dementia care program.

## 2024-12-05 ENCOUNTER — Ambulatory Visit: Payer: Medicare Other

## 2024-12-05 ENCOUNTER — Ambulatory Visit: Payer: Medicare Other | Admitting: Internal Medicine

## 2025-01-04 ENCOUNTER — Ambulatory Visit: Admitting: Podiatry
